# Patient Record
Sex: Male | Born: 1945 | Race: Black or African American | Hispanic: No | Marital: Married | Smoking: Never smoker
Health system: Southern US, Community
[De-identification: ages and names within clinical notes are randomized; demographics above are authoritative.]

## PROBLEM LIST (undated history)

## (undated) DIAGNOSIS — E538 Deficiency of other specified B group vitamins: Secondary | ICD-10-CM

## (undated) DIAGNOSIS — D649 Anemia, unspecified: Secondary | ICD-10-CM

## (undated) DIAGNOSIS — K219 Gastro-esophageal reflux disease without esophagitis: Secondary | ICD-10-CM

## (undated) DIAGNOSIS — H8110 Benign paroxysmal vertigo, unspecified ear: Secondary | ICD-10-CM

## (undated) DIAGNOSIS — M109 Gout, unspecified: Secondary | ICD-10-CM

## (undated) DIAGNOSIS — E785 Hyperlipidemia, unspecified: Secondary | ICD-10-CM

## (undated) DIAGNOSIS — H269 Unspecified cataract: Secondary | ICD-10-CM

## (undated) DIAGNOSIS — N259 Disorder resulting from impaired renal tubular function, unspecified: Secondary | ICD-10-CM

## (undated) DIAGNOSIS — M1990 Unspecified osteoarthritis, unspecified site: Secondary | ICD-10-CM

## (undated) DIAGNOSIS — R9431 Abnormal electrocardiogram [ECG] [EKG]: Secondary | ICD-10-CM

## (undated) DIAGNOSIS — I10 Essential (primary) hypertension: Secondary | ICD-10-CM

## (undated) DIAGNOSIS — G510 Bell's palsy: Secondary | ICD-10-CM

## (undated) HISTORY — DX: Gout, unspecified: M10.9

## (undated) HISTORY — DX: Gastro-esophageal reflux disease without esophagitis: K21.9

## (undated) HISTORY — PX: CARPAL TUNNEL RELEASE: SHX101

## (undated) HISTORY — DX: Essential (primary) hypertension: I10

## (undated) HISTORY — DX: Disorder resulting from impaired renal tubular function, unspecified: N25.9

## (undated) HISTORY — DX: Anemia, unspecified: D64.9

## (undated) HISTORY — DX: Unspecified cataract: H26.9

## (undated) HISTORY — DX: Benign paroxysmal vertigo, unspecified ear: H81.10

## (undated) HISTORY — PX: CATARACT EXTRACTION, BILATERAL: SHX1313

## (undated) HISTORY — DX: Hyperlipidemia, unspecified: E78.5

## (undated) HISTORY — DX: Deficiency of other specified B group vitamins: E53.8

## (undated) HISTORY — DX: Bell's palsy: G51.0

---

## 1998-05-08 ENCOUNTER — Emergency Department (HOSPITAL_COMMUNITY): Admission: EM | Admit: 1998-05-08 | Discharge: 1998-05-08 | Payer: Self-pay | Admitting: Emergency Medicine

## 1998-09-27 ENCOUNTER — Ambulatory Visit (HOSPITAL_COMMUNITY): Admission: RE | Admit: 1998-09-27 | Discharge: 1998-09-27 | Payer: Self-pay | Admitting: Gastroenterology

## 2000-10-20 ENCOUNTER — Ambulatory Visit (HOSPITAL_COMMUNITY): Admission: RE | Admit: 2000-10-20 | Discharge: 2000-10-20 | Payer: Self-pay | Admitting: Gastroenterology

## 2001-08-17 ENCOUNTER — Ambulatory Visit (HOSPITAL_COMMUNITY): Admission: RE | Admit: 2001-08-17 | Discharge: 2001-08-17 | Payer: Self-pay | Admitting: Gastroenterology

## 2001-08-17 ENCOUNTER — Encounter (INDEPENDENT_AMBULATORY_CARE_PROVIDER_SITE_OTHER): Payer: Self-pay | Admitting: Specialist

## 2004-09-23 ENCOUNTER — Ambulatory Visit: Payer: Self-pay | Admitting: Internal Medicine

## 2004-10-02 ENCOUNTER — Ambulatory Visit: Payer: Self-pay | Admitting: Internal Medicine

## 2004-12-03 ENCOUNTER — Ambulatory Visit: Payer: Self-pay | Admitting: Internal Medicine

## 2005-01-01 ENCOUNTER — Ambulatory Visit: Payer: Self-pay | Admitting: Internal Medicine

## 2005-02-02 ENCOUNTER — Ambulatory Visit: Payer: Self-pay | Admitting: Internal Medicine

## 2005-03-03 ENCOUNTER — Ambulatory Visit: Payer: Self-pay | Admitting: Internal Medicine

## 2005-04-13 ENCOUNTER — Ambulatory Visit: Payer: Self-pay | Admitting: Internal Medicine

## 2005-04-15 ENCOUNTER — Ambulatory Visit: Payer: Self-pay | Admitting: Internal Medicine

## 2005-04-20 ENCOUNTER — Ambulatory Visit: Payer: Self-pay | Admitting: Internal Medicine

## 2005-05-11 ENCOUNTER — Ambulatory Visit: Payer: Self-pay | Admitting: Internal Medicine

## 2005-06-11 ENCOUNTER — Ambulatory Visit: Payer: Self-pay | Admitting: Internal Medicine

## 2005-07-13 ENCOUNTER — Ambulatory Visit: Payer: Self-pay | Admitting: Internal Medicine

## 2005-08-10 ENCOUNTER — Ambulatory Visit: Payer: Self-pay | Admitting: Internal Medicine

## 2005-09-10 ENCOUNTER — Ambulatory Visit: Payer: Self-pay | Admitting: Internal Medicine

## 2005-09-23 ENCOUNTER — Ambulatory Visit: Payer: Self-pay | Admitting: Internal Medicine

## 2005-10-09 ENCOUNTER — Ambulatory Visit: Payer: Self-pay | Admitting: Internal Medicine

## 2005-10-13 ENCOUNTER — Ambulatory Visit: Payer: Self-pay | Admitting: Internal Medicine

## 2005-10-19 ENCOUNTER — Ambulatory Visit: Payer: Self-pay | Admitting: Internal Medicine

## 2005-11-09 ENCOUNTER — Ambulatory Visit: Payer: Self-pay | Admitting: Internal Medicine

## 2005-11-23 ENCOUNTER — Ambulatory Visit: Payer: Self-pay | Admitting: Internal Medicine

## 2005-12-10 ENCOUNTER — Ambulatory Visit: Payer: Self-pay | Admitting: Internal Medicine

## 2006-01-06 ENCOUNTER — Ambulatory Visit: Payer: Self-pay | Admitting: Internal Medicine

## 2006-02-08 ENCOUNTER — Ambulatory Visit: Payer: Self-pay | Admitting: Internal Medicine

## 2006-02-22 ENCOUNTER — Ambulatory Visit: Payer: Self-pay | Admitting: Internal Medicine

## 2006-02-23 ENCOUNTER — Encounter: Admission: RE | Admit: 2006-02-23 | Discharge: 2006-02-23 | Payer: Self-pay | Admitting: Internal Medicine

## 2006-03-04 ENCOUNTER — Ambulatory Visit: Payer: Self-pay

## 2006-03-09 ENCOUNTER — Ambulatory Visit: Payer: Self-pay | Admitting: Internal Medicine

## 2006-04-02 ENCOUNTER — Ambulatory Visit (HOSPITAL_COMMUNITY): Admission: RE | Admit: 2006-04-02 | Discharge: 2006-04-03 | Payer: Self-pay | Admitting: Neurosurgery

## 2006-05-27 ENCOUNTER — Encounter: Admission: RE | Admit: 2006-05-27 | Discharge: 2006-05-27 | Payer: Self-pay | Admitting: Neurosurgery

## 2006-06-07 ENCOUNTER — Ambulatory Visit: Payer: Self-pay | Admitting: Internal Medicine

## 2006-07-08 ENCOUNTER — Ambulatory Visit: Payer: Self-pay | Admitting: Internal Medicine

## 2006-08-05 ENCOUNTER — Ambulatory Visit: Payer: Self-pay | Admitting: Internal Medicine

## 2006-08-09 ENCOUNTER — Encounter: Payer: Self-pay | Admitting: Internal Medicine

## 2006-08-10 ENCOUNTER — Encounter: Admission: RE | Admit: 2006-08-10 | Discharge: 2006-11-08 | Payer: Self-pay | Admitting: Neurosurgery

## 2006-08-12 ENCOUNTER — Emergency Department (HOSPITAL_COMMUNITY): Admission: EM | Admit: 2006-08-12 | Discharge: 2006-08-12 | Payer: Self-pay | Admitting: Emergency Medicine

## 2006-08-31 ENCOUNTER — Encounter: Admission: RE | Admit: 2006-08-31 | Discharge: 2006-08-31 | Payer: Self-pay | Admitting: Neurosurgery

## 2006-09-06 ENCOUNTER — Ambulatory Visit: Payer: Self-pay | Admitting: Internal Medicine

## 2006-09-28 ENCOUNTER — Ambulatory Visit: Payer: Self-pay | Admitting: Internal Medicine

## 2006-09-28 LAB — CONVERTED CEMR LAB
ALT: 19 units/L (ref 0–40)
AST: 25 units/L (ref 0–37)
Albumin: 4.2 g/dL (ref 3.5–5.2)
Alkaline Phosphatase: 63 units/L (ref 39–117)
BUN: 14 mg/dL (ref 6–23)
Bilirubin, Direct: 0.1 mg/dL (ref 0.0–0.3)
CO2: 29 meq/L (ref 19–32)
Calcium: 9.6 mg/dL (ref 8.4–10.5)
Chloride: 102 meq/L (ref 96–112)
Chol/HDL Ratio, serum: 7.2
Cholesterol: 232 mg/dL (ref 0–200)
Creatinine, Ser: 1.6 mg/dL — ABNORMAL HIGH (ref 0.4–1.5)
GFR calc non Af Amer: 47 mL/min
Glomerular Filtration Rate, Af Am: 57 mL/min/{1.73_m2}
Glucose, Bld: 89 mg/dL (ref 70–99)
HDL: 32.1 mg/dL — ABNORMAL LOW (ref >39.0)
LDL DIRECT: 161.4 mg/dL
Potassium: 5.1 meq/L (ref 3.5–5.1)
Sodium: 138 meq/L (ref 135–145)
Total Bilirubin: 0.4 mg/dL (ref 0.3–1.2)
Total Protein: 6.8 g/dL (ref 6.0–8.3)
Triglyceride fasting, serum: 149 mg/dL (ref 0–149)
VLDL: 30 mg/dL (ref 0–40)

## 2006-10-07 ENCOUNTER — Ambulatory Visit: Payer: Self-pay | Admitting: Internal Medicine

## 2006-11-09 ENCOUNTER — Ambulatory Visit: Payer: Self-pay | Admitting: Internal Medicine

## 2006-11-17 ENCOUNTER — Ambulatory Visit: Payer: Self-pay | Admitting: Internal Medicine

## 2006-11-17 LAB — CONVERTED CEMR LAB
ALT: 18 units/L (ref 0–40)
AST: 28 units/L (ref 0–37)
Albumin: 4.1 g/dL (ref 3.5–5.2)
Alkaline Phosphatase: 80 units/L (ref 39–117)
BUN: 15 mg/dL (ref 6–23)
Bilirubin, Direct: 0.1 mg/dL (ref 0.0–0.3)
CO2: 30 meq/L (ref 19–32)
Calcium: 9.4 mg/dL (ref 8.4–10.5)
Chloride: 103 meq/L (ref 96–112)
Cholesterol: 222 mg/dL (ref 0–200)
Creatinine, Ser: 1.6 mg/dL — ABNORMAL HIGH (ref 0.4–1.5)
Direct LDL: 146.2 mg/dL
GFR calc Af Amer: 57 mL/min
GFR calc non Af Amer: 47 mL/min
Glucose, Bld: 88 mg/dL (ref 70–99)
HDL: 34 mg/dL — ABNORMAL LOW (ref >39.0)
Potassium: 4.7 meq/L (ref 3.5–5.1)
Sodium: 139 meq/L (ref 135–145)
Total Bilirubin: 0.5 mg/dL (ref 0.3–1.2)
Total CHOL/HDL Ratio: 6.5
Total Protein: 6.9 g/dL (ref 6.0–8.3)
Triglycerides: 184 mg/dL — ABNORMAL HIGH (ref 0–149)
VLDL: 37 mg/dL (ref 0–40)

## 2006-12-08 ENCOUNTER — Ambulatory Visit: Payer: Self-pay | Admitting: Internal Medicine

## 2006-12-29 ENCOUNTER — Ambulatory Visit: Payer: Self-pay | Admitting: Internal Medicine

## 2007-01-05 ENCOUNTER — Ambulatory Visit: Payer: Self-pay | Admitting: Internal Medicine

## 2007-02-03 ENCOUNTER — Ambulatory Visit: Payer: Self-pay | Admitting: Internal Medicine

## 2007-02-09 ENCOUNTER — Ambulatory Visit: Payer: Self-pay | Admitting: Internal Medicine

## 2007-03-04 ENCOUNTER — Encounter
Admission: RE | Admit: 2007-03-04 | Discharge: 2007-06-02 | Payer: Self-pay | Admitting: Physical Medicine & Rehabilitation

## 2007-03-07 ENCOUNTER — Ambulatory Visit: Payer: Self-pay | Admitting: Physical Medicine & Rehabilitation

## 2007-03-08 ENCOUNTER — Ambulatory Visit: Payer: Self-pay | Admitting: Internal Medicine

## 2007-03-10 ENCOUNTER — Encounter
Admission: RE | Admit: 2007-03-10 | Discharge: 2007-03-10 | Payer: Self-pay | Admitting: Physical Medicine & Rehabilitation

## 2007-04-07 ENCOUNTER — Ambulatory Visit: Payer: Self-pay | Admitting: Internal Medicine

## 2007-04-13 ENCOUNTER — Ambulatory Visit: Payer: Self-pay | Admitting: Internal Medicine

## 2007-04-13 LAB — CONVERTED CEMR LAB
BUN: 13 mg/dL (ref 6–23)
CO2: 30 meq/L (ref 19–32)
Calcium: 9.4 mg/dL (ref 8.4–10.5)
Chloride: 98 meq/L (ref 96–112)
Cholesterol: 255 mg/dL (ref 0–200)
Creatinine, Ser: 1.4 mg/dL (ref 0.4–1.5)
Direct LDL: 164.7 mg/dL
GFR calc Af Amer: 66 mL/min
GFR calc non Af Amer: 54 mL/min
Glucose, Bld: 89 mg/dL (ref 70–99)
HDL: 36.1 mg/dL — ABNORMAL LOW (ref >39.0)
Potassium: 4 meq/L (ref 3.5–5.1)
Sodium: 136 meq/L (ref 135–145)
Total CHOL/HDL Ratio: 7.1
Triglycerides: 169 mg/dL — ABNORMAL HIGH (ref 0–149)
VLDL: 34 mg/dL (ref 0–40)

## 2007-04-21 ENCOUNTER — Ambulatory Visit: Payer: Self-pay | Admitting: Physical Medicine & Rehabilitation

## 2007-05-05 ENCOUNTER — Ambulatory Visit: Payer: Self-pay | Admitting: Internal Medicine

## 2007-06-07 ENCOUNTER — Ambulatory Visit: Payer: Self-pay | Admitting: Internal Medicine

## 2007-06-15 ENCOUNTER — Encounter
Admission: RE | Admit: 2007-06-15 | Discharge: 2007-07-14 | Payer: Self-pay | Admitting: Physical Medicine & Rehabilitation

## 2007-06-16 ENCOUNTER — Ambulatory Visit: Payer: Self-pay | Admitting: Physical Medicine & Rehabilitation

## 2007-07-07 ENCOUNTER — Ambulatory Visit: Payer: Self-pay | Admitting: Internal Medicine

## 2007-07-07 DIAGNOSIS — E538 Deficiency of other specified B group vitamins: Secondary | ICD-10-CM | POA: Insufficient documentation

## 2007-07-07 HISTORY — DX: Deficiency of other specified B group vitamins: E53.8

## 2007-07-13 ENCOUNTER — Ambulatory Visit: Payer: Self-pay | Admitting: Internal Medicine

## 2007-07-13 DIAGNOSIS — I10 Essential (primary) hypertension: Secondary | ICD-10-CM

## 2007-07-13 DIAGNOSIS — E785 Hyperlipidemia, unspecified: Secondary | ICD-10-CM

## 2007-07-13 DIAGNOSIS — K219 Gastro-esophageal reflux disease without esophagitis: Secondary | ICD-10-CM

## 2007-07-13 HISTORY — DX: Gastro-esophageal reflux disease without esophagitis: K21.9

## 2007-07-13 HISTORY — DX: Essential (primary) hypertension: I10

## 2007-07-13 HISTORY — DX: Hyperlipidemia, unspecified: E78.5

## 2007-07-13 HISTORY — PX: BACK SURGERY: SHX140

## 2007-08-04 ENCOUNTER — Ambulatory Visit: Payer: Self-pay | Admitting: Internal Medicine

## 2007-09-06 ENCOUNTER — Ambulatory Visit: Payer: Self-pay | Admitting: Internal Medicine

## 2007-09-07 ENCOUNTER — Ambulatory Visit: Payer: Self-pay | Admitting: Physical Medicine & Rehabilitation

## 2007-09-08 ENCOUNTER — Encounter
Admission: RE | Admit: 2007-09-08 | Discharge: 2007-09-08 | Payer: Self-pay | Admitting: Physical Medicine & Rehabilitation

## 2007-10-06 ENCOUNTER — Ambulatory Visit: Payer: Self-pay | Admitting: Internal Medicine

## 2007-11-08 ENCOUNTER — Ambulatory Visit: Payer: Self-pay | Admitting: Internal Medicine

## 2007-11-08 LAB — CONVERTED CEMR LAB
Albumin: 4.3 g/dL (ref 3.5–5.2)
Alkaline Phosphatase: 58 units/L (ref 39–117)
BUN: 13 mg/dL (ref 6–23)
CO2: 31 meq/L (ref 19–32)
GFR calc Af Amer: 61 mL/min
HDL: 32.5 mg/dL — ABNORMAL LOW (ref >39.0)
Potassium: 4.4 meq/L (ref 3.5–5.1)
Total Protein: 7.2 g/dL (ref 6.0–8.3)
Triglycerides: 183 mg/dL — ABNORMAL HIGH (ref 0–149)
VLDL: 37 mg/dL (ref 0–40)

## 2007-11-15 ENCOUNTER — Ambulatory Visit: Payer: Self-pay | Admitting: Internal Medicine

## 2007-12-07 ENCOUNTER — Ambulatory Visit: Payer: Self-pay | Admitting: Internal Medicine

## 2007-12-08 ENCOUNTER — Ambulatory Visit: Payer: Self-pay | Admitting: Physical Medicine & Rehabilitation

## 2007-12-08 ENCOUNTER — Encounter
Admission: RE | Admit: 2007-12-08 | Discharge: 2008-02-02 | Payer: Self-pay | Admitting: Physical Medicine & Rehabilitation

## 2007-12-22 ENCOUNTER — Ambulatory Visit: Payer: Self-pay | Admitting: Internal Medicine

## 2007-12-22 LAB — CONVERTED CEMR LAB
AST: 25 units/L (ref 0–37)
Bilirubin, Direct: 0.1 mg/dL (ref 0.0–0.3)
Cholesterol: 199 mg/dL (ref 0–200)
Direct LDL: 120.5 mg/dL
HDL: 30 mg/dL — ABNORMAL LOW (ref >39.0)
Total Protein: 7.4 g/dL (ref 6.0–8.3)
VLDL: 42 mg/dL — ABNORMAL HIGH (ref 0–40)

## 2007-12-28 ENCOUNTER — Ambulatory Visit: Payer: Self-pay | Admitting: Internal Medicine

## 2008-01-04 ENCOUNTER — Ambulatory Visit: Payer: Self-pay | Admitting: Internal Medicine

## 2008-02-02 ENCOUNTER — Ambulatory Visit: Payer: Self-pay | Admitting: Physical Medicine & Rehabilitation

## 2008-02-08 ENCOUNTER — Ambulatory Visit: Payer: Self-pay | Admitting: Internal Medicine

## 2008-02-24 ENCOUNTER — Encounter: Payer: Self-pay | Admitting: Internal Medicine

## 2008-03-01 ENCOUNTER — Ambulatory Visit: Payer: Self-pay | Admitting: Internal Medicine

## 2008-03-01 LAB — CONVERTED CEMR LAB
ALT: 17 units/L (ref 0–53)
AST: 27 units/L (ref 0–37)
Alkaline Phosphatase: 66 units/L (ref 39–117)
Bilirubin, Direct: 0.1 mg/dL (ref 0.0–0.3)
HDL: 28.2 mg/dL — ABNORMAL LOW (ref >39.0)
PSA: 2.57 ng/mL (ref 0.10–4.00)
Total Protein: 7.2 g/dL (ref 6.0–8.3)

## 2008-03-08 ENCOUNTER — Ambulatory Visit: Payer: Self-pay | Admitting: Internal Medicine

## 2008-04-05 ENCOUNTER — Ambulatory Visit: Payer: Self-pay | Admitting: Internal Medicine

## 2008-04-12 ENCOUNTER — Ambulatory Visit: Payer: Self-pay | Admitting: Internal Medicine

## 2008-04-12 LAB — CONVERTED CEMR LAB
ALT: 20 units/L (ref 0–53)
AST: 30 units/L (ref 0–37)
Albumin: 3.8 g/dL (ref 3.5–5.2)
Direct LDL: 137.5 mg/dL
HDL: 27.2 mg/dL — ABNORMAL LOW (ref >39.0)
Total Protein: 7.2 g/dL (ref 6.0–8.3)

## 2008-04-24 ENCOUNTER — Encounter
Admission: RE | Admit: 2008-04-24 | Discharge: 2008-04-26 | Payer: Self-pay | Admitting: Physical Medicine & Rehabilitation

## 2008-04-24 ENCOUNTER — Ambulatory Visit: Payer: Self-pay | Admitting: Internal Medicine

## 2008-04-26 ENCOUNTER — Ambulatory Visit: Payer: Self-pay | Admitting: Physical Medicine & Rehabilitation

## 2008-05-08 ENCOUNTER — Ambulatory Visit: Payer: Self-pay | Admitting: Internal Medicine

## 2008-06-05 ENCOUNTER — Ambulatory Visit: Payer: Self-pay | Admitting: Internal Medicine

## 2008-07-05 ENCOUNTER — Ambulatory Visit: Payer: Self-pay | Admitting: Internal Medicine

## 2008-07-19 ENCOUNTER — Ambulatory Visit: Payer: Self-pay | Admitting: Internal Medicine

## 2008-07-19 LAB — CONVERTED CEMR LAB
ALT: 21 units/L (ref 0–53)
AST: 26 units/L (ref 0–37)
Alkaline Phosphatase: 68 units/L (ref 39–117)
Bilirubin, Direct: 0.1 mg/dL (ref 0.0–0.3)
Total Bilirubin: 0.6 mg/dL (ref 0.3–1.2)

## 2008-07-26 ENCOUNTER — Ambulatory Visit: Payer: Self-pay | Admitting: Internal Medicine

## 2008-08-02 ENCOUNTER — Ambulatory Visit: Payer: Self-pay | Admitting: Internal Medicine

## 2008-08-22 ENCOUNTER — Encounter
Admission: RE | Admit: 2008-08-22 | Discharge: 2008-08-23 | Payer: Self-pay | Admitting: Physical Medicine & Rehabilitation

## 2008-08-23 ENCOUNTER — Ambulatory Visit: Payer: Self-pay | Admitting: Physical Medicine & Rehabilitation

## 2008-09-04 ENCOUNTER — Ambulatory Visit: Payer: Self-pay | Admitting: Internal Medicine

## 2008-10-04 ENCOUNTER — Ambulatory Visit: Payer: Self-pay | Admitting: Internal Medicine

## 2008-11-05 ENCOUNTER — Ambulatory Visit: Payer: Self-pay | Admitting: Internal Medicine

## 2008-11-27 ENCOUNTER — Encounter
Admission: RE | Admit: 2008-11-27 | Discharge: 2009-02-25 | Payer: Self-pay | Admitting: Physical Medicine & Rehabilitation

## 2008-11-27 ENCOUNTER — Ambulatory Visit: Payer: Self-pay | Admitting: Physical Medicine & Rehabilitation

## 2008-12-05 ENCOUNTER — Ambulatory Visit: Payer: Self-pay | Admitting: Internal Medicine

## 2008-12-19 ENCOUNTER — Telehealth: Payer: Self-pay | Admitting: Internal Medicine

## 2009-01-07 ENCOUNTER — Ambulatory Visit: Payer: Self-pay | Admitting: Internal Medicine

## 2009-01-11 ENCOUNTER — Ambulatory Visit: Payer: Self-pay | Admitting: Internal Medicine

## 2009-01-11 LAB — CONVERTED CEMR LAB
Albumin: 4.1 g/dL (ref 3.5–5.2)
Alkaline Phosphatase: 76 units/L (ref 39–117)
BUN: 19 mg/dL (ref 6–23)
Cholesterol: 191 mg/dL (ref 0–200)
GFR calc Af Amer: 56 mL/min
GFR calc non Af Amer: 46 mL/min
LDL Cholesterol: 127 mg/dL — ABNORMAL HIGH (ref 0–99)
Potassium: 4.1 meq/L (ref 3.5–5.1)
Sodium: 140 meq/L (ref 135–145)
Total CHOL/HDL Ratio: 5.4
VLDL: 29 mg/dL (ref 0–40)

## 2009-01-18 ENCOUNTER — Ambulatory Visit: Payer: Self-pay | Admitting: Internal Medicine

## 2009-02-07 ENCOUNTER — Ambulatory Visit: Payer: Self-pay | Admitting: Internal Medicine

## 2009-03-11 ENCOUNTER — Ambulatory Visit: Payer: Self-pay | Admitting: Internal Medicine

## 2009-03-13 ENCOUNTER — Encounter
Admission: RE | Admit: 2009-03-13 | Discharge: 2009-03-18 | Payer: Self-pay | Admitting: Physical Medicine & Rehabilitation

## 2009-03-18 ENCOUNTER — Ambulatory Visit: Payer: Self-pay | Admitting: Physical Medicine & Rehabilitation

## 2009-04-08 ENCOUNTER — Ambulatory Visit: Payer: Self-pay | Admitting: Internal Medicine

## 2009-05-08 ENCOUNTER — Ambulatory Visit: Payer: Self-pay | Admitting: Internal Medicine

## 2009-06-10 ENCOUNTER — Ambulatory Visit: Payer: Self-pay | Admitting: Internal Medicine

## 2009-06-14 ENCOUNTER — Telehealth: Payer: Self-pay | Admitting: Internal Medicine

## 2009-06-18 ENCOUNTER — Encounter: Payer: Self-pay | Admitting: Internal Medicine

## 2009-06-19 ENCOUNTER — Telehealth: Payer: Self-pay | Admitting: Internal Medicine

## 2009-07-10 ENCOUNTER — Ambulatory Visit: Payer: Self-pay | Admitting: Internal Medicine

## 2009-07-15 ENCOUNTER — Ambulatory Visit: Payer: Self-pay | Admitting: Internal Medicine

## 2009-07-15 LAB — CONVERTED CEMR LAB
Albumin: 4.1 g/dL (ref 3.5–5.2)
Alkaline Phosphatase: 61 units/L (ref 39–117)
BUN: 18 mg/dL (ref 6–23)
Basophils Absolute: 0 10*3/uL (ref 0.0–0.1)
Bilirubin, Direct: 0 mg/dL (ref 0.0–0.3)
Blood in Urine, dipstick: NEGATIVE
CO2: 32 meq/L (ref 19–32)
Calcium: 9.3 mg/dL (ref 8.4–10.5)
Creatinine, Ser: 1.7 mg/dL — ABNORMAL HIGH (ref 0.4–1.5)
Eosinophils Absolute: 0.1 10*3/uL (ref 0.0–0.7)
Glucose, Bld: 99 mg/dL (ref 70–99)
Glucose, Urine, Semiquant: NEGATIVE
HDL: 34.5 mg/dL — ABNORMAL LOW (ref >39.00)
Ketones, urine, test strip: NEGATIVE
Lymphocytes Relative: 28.4 % (ref 12.0–46.0)
MCHC: 32.9 g/dL (ref 30.0–36.0)
Neutrophils Relative %: 59 % (ref 43.0–77.0)
Nitrite: NEGATIVE
PSA: 2.33 ng/mL (ref 0.10–4.00)
RDW: 13.6 % (ref 11.5–14.6)
Specific Gravity, Urine: 1.015
Triglycerides: 153 mg/dL — ABNORMAL HIGH (ref 0.0–149.0)
pH: 7

## 2009-08-02 ENCOUNTER — Ambulatory Visit: Payer: Self-pay | Admitting: Internal Medicine

## 2009-08-02 DIAGNOSIS — D649 Anemia, unspecified: Secondary | ICD-10-CM

## 2009-08-02 HISTORY — DX: Anemia, unspecified: D64.9

## 2009-08-08 LAB — CONVERTED CEMR LAB
Basophils Relative: 0.4 % (ref 0.0–3.0)
Eosinophils Relative: 1.7 % (ref 0.0–5.0)
Ferritin: 123.2 ng/mL (ref 22.0–322.0)
Folate: 7.7 ng/mL
HCT: 37.7 % — ABNORMAL LOW (ref 39.0–52.0)
MCV: 83.5 fL (ref 78.0–100.0)
Monocytes Absolute: 0.3 10*3/uL (ref 0.1–1.0)
Monocytes Relative: 8.7 % (ref 3.0–12.0)
Neutrophils Relative %: 61.2 % (ref 43.0–77.0)
RBC: 4.51 M/uL (ref 4.22–5.81)
WBC: 3.3 10*3/uL — ABNORMAL LOW (ref 4.5–10.5)

## 2009-08-09 ENCOUNTER — Ambulatory Visit: Payer: Self-pay | Admitting: Internal Medicine

## 2009-09-10 ENCOUNTER — Ambulatory Visit: Payer: Self-pay | Admitting: Internal Medicine

## 2009-10-08 ENCOUNTER — Ambulatory Visit: Payer: Self-pay | Admitting: Internal Medicine

## 2009-11-07 ENCOUNTER — Ambulatory Visit: Payer: Self-pay | Admitting: Internal Medicine

## 2009-11-07 ENCOUNTER — Telehealth: Payer: Self-pay | Admitting: Internal Medicine

## 2009-11-07 LAB — CONVERTED CEMR LAB
Basophils Relative: 0.5 % (ref 0.0–3.0)
Eosinophils Relative: 2.9 % (ref 0.0–5.0)
Hemoglobin: 12.5 g/dL — ABNORMAL LOW (ref 13.0–17.0)
Lymphocytes Relative: 29.7 % (ref 12.0–46.0)
MCHC: 32.3 g/dL (ref 30.0–36.0)
Monocytes Relative: 9.5 % (ref 3.0–12.0)
Neutro Abs: 2.1 10*3/uL (ref 1.4–7.7)
Neutrophils Relative %: 57.4 % (ref 43.0–77.0)
RBC: 4.56 M/uL (ref 4.22–5.81)
WBC: 3.7 10*3/uL — ABNORMAL LOW (ref 4.5–10.5)

## 2009-11-25 ENCOUNTER — Ambulatory Visit: Payer: Self-pay | Admitting: Internal Medicine

## 2009-11-25 LAB — CONVERTED CEMR LAB
BUN: 22 mg/dL (ref 6–23)
Bilirubin, Direct: 0.1 mg/dL (ref 0.0–0.3)
Chloride: 101 meq/L (ref 96–112)
Glucose, Bld: 99 mg/dL (ref 70–99)
HDL: 36.9 mg/dL — ABNORMAL LOW (ref >39.00)
Potassium: 4.2 meq/L (ref 3.5–5.1)
Sodium: 139 meq/L (ref 135–145)
Total Bilirubin: 0.7 mg/dL (ref 0.3–1.2)
Total CHOL/HDL Ratio: 7
Triglycerides: 161 mg/dL — ABNORMAL HIGH (ref 0.0–149.0)
VLDL: 32.2 mg/dL (ref 0.0–40.0)

## 2009-12-09 ENCOUNTER — Ambulatory Visit: Payer: Self-pay | Admitting: Internal Medicine

## 2010-01-06 ENCOUNTER — Ambulatory Visit: Payer: Self-pay | Admitting: Internal Medicine

## 2010-02-04 ENCOUNTER — Ambulatory Visit: Payer: Self-pay | Admitting: Internal Medicine

## 2010-02-05 LAB — CONVERTED CEMR LAB
Basophils Absolute: 0 10*3/uL (ref 0.0–0.1)
CO2: 32 meq/L (ref 19–32)
Calcium: 9.5 mg/dL (ref 8.4–10.5)
Creatinine, Ser: 1.6 mg/dL — ABNORMAL HIGH (ref 0.4–1.5)
Eosinophils Absolute: 0.1 10*3/uL (ref 0.0–0.7)
GFR calc non Af Amer: 55.88 mL/min (ref >60)
Glucose, Bld: 91 mg/dL (ref 70–99)
HDL: 40.3 mg/dL (ref >39.00)
Hemoglobin: 12.6 g/dL — ABNORMAL LOW (ref 13.0–17.0)
Lymphocytes Relative: 29.8 % (ref 12.0–46.0)
MCHC: 34.2 g/dL (ref 30.0–36.0)
Monocytes Relative: 8.4 % (ref 3.0–12.0)
Neutro Abs: 1.9 10*3/uL (ref 1.4–7.7)
Neutrophils Relative %: 59.1 % (ref 43.0–77.0)
RBC: 4.49 M/uL (ref 4.22–5.81)
RDW: 15.2 % — ABNORMAL HIGH (ref 11.5–14.6)
Sodium: 139 meq/L (ref 135–145)
TSH: 1.56 microintl units/mL (ref 0.35–5.50)
Triglycerides: 133 mg/dL (ref 0.0–149.0)
VLDL: 26.6 mg/dL (ref 0.0–40.0)

## 2010-03-05 ENCOUNTER — Ambulatory Visit: Payer: Self-pay | Admitting: Internal Medicine

## 2010-04-07 ENCOUNTER — Ambulatory Visit: Payer: Self-pay | Admitting: Internal Medicine

## 2010-04-25 ENCOUNTER — Ambulatory Visit: Payer: Self-pay | Admitting: Family Medicine

## 2010-04-25 ENCOUNTER — Telehealth: Payer: Self-pay | Admitting: Internal Medicine

## 2010-05-07 ENCOUNTER — Ambulatory Visit: Payer: Self-pay | Admitting: Internal Medicine

## 2010-06-04 ENCOUNTER — Ambulatory Visit: Payer: Self-pay | Admitting: Internal Medicine

## 2010-06-04 DIAGNOSIS — N182 Chronic kidney disease, stage 2 (mild): Secondary | ICD-10-CM

## 2010-06-04 DIAGNOSIS — N259 Disorder resulting from impaired renal tubular function, unspecified: Secondary | ICD-10-CM

## 2010-06-04 HISTORY — DX: Disorder resulting from impaired renal tubular function, unspecified: N25.9

## 2010-06-17 ENCOUNTER — Ambulatory Visit: Payer: Self-pay | Admitting: Internal Medicine

## 2010-06-17 DIAGNOSIS — M109 Gout, unspecified: Secondary | ICD-10-CM

## 2010-06-17 HISTORY — DX: Gout, unspecified: M10.9

## 2010-07-08 ENCOUNTER — Ambulatory Visit: Payer: Self-pay | Admitting: Internal Medicine

## 2010-08-07 ENCOUNTER — Ambulatory Visit: Payer: Self-pay | Admitting: Internal Medicine

## 2010-08-11 ENCOUNTER — Ambulatory Visit: Payer: Self-pay | Admitting: Internal Medicine

## 2010-08-14 ENCOUNTER — Telehealth: Payer: Self-pay | Admitting: Internal Medicine

## 2010-08-19 LAB — CONVERTED CEMR LAB
ALT: 19 units/L (ref 0–53)
AST: 26 units/L (ref 0–37)
Albumin: 4.1 g/dL (ref 3.5–5.2)
Alkaline Phosphatase: 55 units/L (ref 39–117)
Basophils Absolute: 0 10*3/uL (ref 0.0–0.1)
Basophils Relative: 0.4 % (ref 0.0–3.0)
Calcium: 9.2 mg/dL (ref 8.4–10.5)
Direct LDL: 171.8 mg/dL
Eosinophils Relative: 1.2 % (ref 0.0–5.0)
GFR calc non Af Amer: 60.57 mL/min (ref >60)
HCT: 38.2 % — ABNORMAL LOW (ref 39.0–52.0)
Hemoglobin: 12.9 g/dL — ABNORMAL LOW (ref 13.0–17.0)
Lymphocytes Relative: 28.5 % (ref 12.0–46.0)
Lymphs Abs: 1.1 10*3/uL (ref 0.7–4.0)
Monocytes Relative: 8.4 % (ref 3.0–12.0)
Neutro Abs: 2.5 10*3/uL (ref 1.4–7.7)
PSA: 5.82 ng/mL — ABNORMAL HIGH (ref 0.10–4.00)
Potassium: 4 meq/L (ref 3.5–5.1)
RBC: 4.5 M/uL (ref 4.22–5.81)
RDW: 16.4 % — ABNORMAL HIGH (ref 11.5–14.6)
Sodium: 139 meq/L (ref 135–145)
Uric Acid, Serum: 5.3 mg/dL (ref 4.0–7.8)
VLDL: 23.4 mg/dL (ref 0.0–40.0)
WBC: 4 10*3/uL — ABNORMAL LOW (ref 4.5–10.5)

## 2010-08-27 ENCOUNTER — Ambulatory Visit: Payer: Self-pay | Admitting: Internal Medicine

## 2010-08-27 LAB — CONVERTED CEMR LAB
BUN: 22 mg/dL (ref 6–23)
Chloride: 100 meq/L (ref 96–112)
Cholesterol: 227 mg/dL — ABNORMAL HIGH (ref 0–200)
Creatinine, Ser: 1.6 mg/dL — ABNORMAL HIGH (ref 0.4–1.5)
GFR calc non Af Amer: 57.43 mL/min (ref >60)
Glucose, Bld: 90 mg/dL (ref 70–99)
HDL: 37.1 mg/dL — ABNORMAL LOW (ref >39.00)
Potassium: 3.9 meq/L (ref 3.5–5.1)

## 2010-09-01 ENCOUNTER — Ambulatory Visit: Payer: Self-pay | Admitting: Internal Medicine

## 2010-09-04 ENCOUNTER — Ambulatory Visit: Payer: Self-pay | Admitting: Internal Medicine

## 2010-10-07 ENCOUNTER — Ambulatory Visit: Payer: Self-pay | Admitting: Internal Medicine

## 2010-11-06 ENCOUNTER — Ambulatory Visit
Admission: RE | Admit: 2010-11-06 | Discharge: 2010-11-06 | Payer: Self-pay | Source: Home / Self Care | Attending: Family Medicine | Admitting: Family Medicine

## 2010-11-10 ENCOUNTER — Encounter: Payer: Self-pay | Admitting: Internal Medicine

## 2010-11-23 ENCOUNTER — Encounter: Payer: Self-pay | Admitting: Internal Medicine

## 2010-11-29 ENCOUNTER — Encounter: Payer: Self-pay | Admitting: Internal Medicine

## 2010-12-02 NOTE — Progress Notes (Signed)
Summary: elbow swelling  Phone Note Call from Patient Call back at Home Phone [phone]   Caller: Patient---voice mail Reason for Call: Talk to Nurse Summary of Call: Please return call, asap. Initial call taken by: Warnell Forester,  April 25, 2010 9:17 AM  Follow-up for Phone Call        c/o elbow swelling so cannot move arm since last night and getting worse.  Appt made with dr fry for this afternoon. Follow-up by: Gladis Riffle, RN,  April 25, 2010 11:05 AM

## 2010-12-02 NOTE — Assessment & Plan Note (Signed)
Summary: B12 INJ // RS  Nurse Visit   Allergies: No Known Drug Allergies  Medication Administration  Injection # 1:    Medication: Vit B12 1000 mcg    Diagnosis: B12 DEFICIENCY (ICD-266.2)    Route: IM    Site: L deltoid    Exp Date: 07/04/2011    Lot #: [id number]    Mfr: American Regent    Patient tolerated injection without complications    Given by: Gladis Riffle, RN (May 07, 2010 1:36 PM)  Orders Added: 1)  Vit B12 1000 mcg [J3420] 2)  Admin of Therapeutic Inj  intramuscular or subcutaneous [96372]   Medication Administration  Injection # 1:    Medication: Vit B12 1000 mcg    Diagnosis: B12 DEFICIENCY (ICD-266.2)    Route: IM    Site: L deltoid    Exp Date: 07/04/2011    Lot #: [id number]    Mfr: American Regent    Patient tolerated injection without complications    Given by: Gladis Riffle, RN (May 07, 2010 1:36 PM)  Orders Added: 1)  Vit B12 1000 mcg [J3420] 2)  Admin of Therapeutic Inj  intramuscular or subcutaneous [[id number]]

## 2010-12-02 NOTE — Assessment & Plan Note (Signed)
Summary: discuss PSA and chol/dm   History of Present Illness: patient comes in for followup after lab work. He had an elevated PSA. To 10 days of Cipro. He is here for follow. Also has struggled with hyperlipidemia. She's here for followup. He has refused statins in the past. Patient admits to being noncompliant with his exercise habits. He states that he is "very lazy".  Review of systems no chest pain or shortness of breath.  Allergies: 1)  ! Nsaids  Past History:  Past Medical History: Last updated: 04/25/2010 B12 deficiency osteoarthritis GERD Hyperlipidemia Hypertension gout  Past Surgical History: Last updated: 07/13/2007 back surgery  Family History: Last updated: 2007/11/27 father deceased young mother deceased young-unknown cause for both  Social History: Last updated: 11-27-2007 Married Never Smoked Alcohol use-no Regular exercise-no  Risk Factors: Exercise: no (08/02/2009)  Risk Factors: Smoking Status: never (06/17/2010)  Physical Exam  General:  alert and well-developed.   Heart:  normal rate and regular rhythm.     Impression & Recommendations:  Problem # 1:  HYPERLIPIDEMIA (ICD-272.4) refuses Rx discussed risk of refusing meds---including catastrophic vascular event  Problem # 2:  PSA, INCREASED (ICD-790.93) resolved f/u next year  Complete Medication List: 1)  Aciphex 20 Mg Tbec (Rabeprazole sodium) .... One as needed once daily 2)  Triamterene-hctz 37.5-25 Mg Tabs (Triamterene-hctz) .... One by mouth daily 3)  Cyanocobalamin 1000 Mcg/ml Inj Soln (Cyanocobalamin) .Marland Kitchen.. every month 4)  Tramadol Hcl 50 Mg Tabs (Tramadol hcl) .... Take 2  two times a day 5)  Polyethylene Glycol 3350 Powd (Polyethylene glycol 3350) .... Every hs 6)  Losartan Potassium 50 Mg Tabs (Losartan potassium) .Marland Kitchen.. 1 by mouth once daily. 7)  Allopurinol 300 Mg Tabs (Allopurinol) .... Take 1 tablet by mouth once a day 8)  Colchicine 0.6 Mg Tabs (Colchicine)  .... Two times a day tab as needed gout  Contraindications/Deferment of Procedures/Staging:    Test/Procedure: Statin Declined    Reason for deferment: patient declined   Patient Instructions: 1)  Please schedule a follow-up appointment in 4 months.   Orders Added: 1)  Est. Patient Level III [27062]

## 2010-12-02 NOTE — Progress Notes (Signed)
Summary: Scott [name]  Scott [name]   Imported By: Scott [name] 04/24/2010 10:06:07  _____________________________________________________________________  External Attachment:    Type:   Image     Comment:   External Document

## 2010-12-02 NOTE — Assessment & Plan Note (Signed)
Summary: b12 inj//ccm  Nurse Visit   Allergies: 1)  ! Nsaids  Medication Administration  Injection # 1:    Medication: Vit B12 1000 mcg    Diagnosis: B12 DEFICIENCY (ICD-266.2)    Route: IM    Site: L deltoid    Exp Date: 7/13    Lot #: [id number]    Mfr: American Regent    Patient tolerated injection without complications    Given by: Alfred Levins, CMA (September 04, 2010 11:45 AM)  Orders Added: 1)  Vit B12 1000 mcg [J3420] 2)  Admin of Therapeutic Inj  intramuscular or subcutaneous [[id number]]

## 2010-12-02 NOTE — Assessment & Plan Note (Signed)
Summary: b12 inj//ccm  Nurse Visit   Allergies (verified): 1)  ! Nsaids  Medication Administration  Injection # 1:    Medication: Vit B12 1000 mcg    Diagnosis: B12 DEFICIENCY (ICD-266.2)    Route: IM    Site: L deltoid    Exp Date: 08/02/2010    Lot #: [id number]    Mfr: American Regent    Patient tolerated injection without complications    Given by: Gladis Riffle, RN (June 04, 2010 1:57 PM)  Orders Added: 1)  Vit B12 1000 mcg [J3420] 2)  Admin of Therapeutic Inj  intramuscular or subcutaneous [[id number]]   Medication Administration  Injection # 1:    Medication: Vit B12 1000 mcg    Diagnosis: B12 DEFICIENCY (ICD-266.2)    Route: IM    Site: L deltoid    Exp Date: 08/02/2010    Lot #: [id number]    Mfr: American Regent    Patient tolerated injection without complications    Given by: Gladis Riffle, RN (June 04, 2010 1:57 PM)  Orders Added: 1)  Vit B12 1000 mcg [J3420] 2)  Admin of Therapeutic Inj  intramuscular or subcutaneous [16010]

## 2010-12-02 NOTE — Progress Notes (Signed)
Summary: something for gout  Phone Note Call from Patient Call back at Home Phone [phone]   Caller: Patient Call For: Birdie Sons MD Summary of Call: Pt was seen monday for swollen elbow has gout, pt would like [facility] [phone] Initial call taken by: Heron Sabins,  August 14, 2010 10:52 AM  Follow-up for Phone Call        see rx Follow-up by: Birdie Sons MD,  August 14, 2010 4:15 PM  Additional Follow-up for Phone Call Additional follow up Details #1::        Phone Call Completed Additional Follow-up by: Rudy Jew, RN,  August 15, 2010 9:26 AM    New/Updated Medications: COLCHICINE 0.6 MG TABS (COLCHICINE) two times a day tab as needed gout Prescriptions: COLCHICINE 0.6 MG TABS (COLCHICINE) two times a day tab as needed gout  #20 x 1   Entered and Authorized by:   Birdie Sons MD   Signed by:   Birdie Sons MD on 08/14/2010   Method used:   [facility]        [facility] [address] [phone]* (retail)       [address]       [place], [place]  [address]       Ph: [phone] or [phone]       Fax: [phone]   RxID:   [id number]

## 2010-12-02 NOTE — Assessment & Plan Note (Signed)
Summary: med check/cjr   Vital Signs:  Patient profile:   65 year old male Weight:      98.6 pounds Temp:     98.6 degrees F oral BP sitting:   110 / 74  (left arm) Cuff size:   regular  Vitals Entered By: Sid Falcon LPN (August 11, 2010 9:54 AM)  Contraindications/Deferment of Procedures/Staging:    Test/Procedure: FLU VAX    Reason for deferment: patient declined     Test/Procedure: Pneumovax vaccine    Reason for deferment: patient declined   History of Present Illness:  Follow-Up Visit      This is a 65 year old man who presents for Follow-up visit.  The patient denies chest pain and palpitations.  Since the last visit the patient notes no new problems or concerns---he complains of multiple aches and pains---these are chronic and unchanged. Pt notwes that when he was treated with prednsione for acute gout lare his chronic aches and pains felt better.  The patient reports taking meds as prescribed.  When questioned about possible medication side effects, the patient notes none.    All other systems reviewed and were negative   Current Problems (verified): 1)  Gout, Unspecified  (ICD-274.9) 2)  Renal Insufficiency  (ICD-588.9) 3)  Bursitis  (ICD-727.3) 4)  Anemia, Other Unspec  (ICD-285.9) 5)  Preventive Health Care  (ICD-V70.0) 6)  Hypertension  (ICD-401.9) 7)  Hyperlipidemia  (ICD-272.4) 8)  Gerd  (ICD-530.81) 9)  B12 Deficiency  (ICD-266.2)  Current Medications (verified): 1)  Aciphex 20 Mg Tbec (Rabeprazole Sodium) .... One As Needed Once Daily 2)  Triamterene-Hctz 37.5-25 Mg Tabs (Triamterene-Hctz) .... One By Mouth Daily 3)  Cyanocobalamin 1000 Mcg/ml Inj Soln (Cyanocobalamin) .Marland Kitchen.. Every Month 4)  Tramadol Hcl 50 Mg Tabs (Tramadol Hcl) .... Take 2  Two Times A Day 5)  Polyethylene Glycol 3350  Powd (Polyethylene Glycol 3350) .... Every Hs 6)  Losartan Potassium 50 Mg Tabs (Losartan Potassium) .Marland Kitchen.. 1 By Mouth Once Daily. 7)  Allopurinol 300 Mg Tabs  (Allopurinol) .... Take 1 Tablet By Mouth Once A Day  Allergies: 1)  ! Nsaids  Past History:  Past Medical History: Last updated: 04/25/2010 B12 deficiency osteoarthritis GERD Hyperlipidemia Hypertension gout  Past Surgical History: Last updated: 07/13/2007 back surgery  Family History: Last updated: 12-14-07 father deceased young mother deceased young-unknown cause for both  Social History: Last updated: 12-14-07 Married Never Smoked Alcohol use-no Regular exercise-no  Risk Factors: Exercise: no (08/02/2009)  Risk Factors: Smoking Status: never (06/17/2010)  Physical Exam  General:  alert and well-developed.   Head:  normocephalic and atraumatic.   Eyes:  pupils equal and pupils round.   Ears:  R ear normal and L ear normal.   Nose:  no external deformity and no external erythema.   Neck:  supple, full ROM, and no masses.   Chest Wall:  No deformities, masses, tenderness or gynecomastia noted. Lungs:  normal respiratory effort and no intercostal retractions.   Heart:  normal rate and regular rhythm.   Abdomen:  soft, non-tender, normal bowel sounds, no distention, no masses, no guarding, and no rigidity,  Skin:  turgor normal and color normal.   Psych:  good eye contact and not anxious appearing.     Impression & Recommendations:  Problem # 1:  HYPERTENSION (ICD-401.9) reviewed Hx and labs His updated medication list for this problem includes:    Triamterene-hctz 37.5-25 Mg Tabs (Triamterene-hctz) ..... One by mouth daily  Losartan Potassium 50 Mg Tabs (Losartan potassium) .Marland Kitchen... 1 by mouth once daily.  BP today: 110/74 Prior BP: 110/80 (06/17/2010)  Labs Reviewed: K+: 4.3 (02/04/2010) Creat: : 1.6 (02/04/2010)   Chol: 227 (02/04/2010)   HDL: 40.30 (02/04/2010)   LDL: 119 (07/15/2009)   TG: 133.0 (02/04/2010)  Problem # 2:  RENAL INSUFFICIENCY (ICD-588.9)  reviewed labs  Orders: TLB-BMP (Basic Metabolic Panel-BMET)  (80048-METABOL)  Problem # 3:  HYPERLIPIDEMIA (ICD-272.4)  no need for rx at this time Labs Reviewed: SGOT: 24 (11/25/2009)   SGPT: 19 (11/25/2009)   HDL:40.30 (02/04/2010), 36.90 (11/25/2009)  LDL:119 (07/15/2009), 127 (01/11/2009)  Chol:227 (02/04/2010), 247 (11/25/2009)  Trig:133.0 (02/04/2010), 161.0 (11/25/2009)  Orders: TLB-Hepatic/Liver Function Pnl (80076-HEPATIC) TLB-Lipid Panel (80061-LIPID) TLB-TSH (Thyroid Stimulating Hormone) (84443-TSH)  Complete Medication List: 1)  Aciphex 20 Mg Tbec (Rabeprazole sodium) .... One as needed once daily 2)  Triamterene-hctz 37.5-25 Mg Tabs (Triamterene-hctz) .... One by mouth daily 3)  Cyanocobalamin 1000 Mcg/ml Inj Soln (Cyanocobalamin) .Marland Kitchen.. every month 4)  Tramadol Hcl 50 Mg Tabs (Tramadol hcl) .... Take 2  two times a day 5)  Polyethylene Glycol 3350 Powd (Polyethylene glycol 3350) .... Every hs 6)  Losartan Potassium 50 Mg Tabs (Losartan potassium) .Marland Kitchen.. 1 by mouth once daily. 7)  Allopurinol 300 Mg Tabs (Allopurinol) .... Take 1 tablet by mouth once a day  Other Orders: Venipuncture (16109) TLB-Uric Acid, Blood (84550-URIC) TLB-CBC Platelet - w/Differential (85025-CBCD) TLB-PSA (Prostate Specific Antigen) (84153-PSA)  Appended Document: Orders Update    Clinical Lists Changes  Orders: Added new Service order of Specimen Handling (60454) - Signed

## 2010-12-02 NOTE — Assessment & Plan Note (Signed)
Summary: FU per MD, stopped lipitor/et   Vital Signs:  Patient profile:   65 year old male Weight:      189 pounds Temp:     97.7 degrees F Pulse rate:   60 / minute Resp:     12 per minute BP sitting:   98 / 64  (left arm)  Vitals Entered By: Gladis Riffle, RN (November 25, 2009 10:56 AM)   History of Present Illness: myalgias---? related to lipitor---unusual in that pain was limited only to right upper arm---no rash,  B12---monthly injections  HTN---tolerating meds without difficulty  All other systems reviewed and were negative   Preventive Screening-Counseling & Management  Alcohol-Tobacco     Smoking Status: never  Current Problems (verified): 1)  Anemia, Other Unspec  (ICD-285.9) 2)  Preventive Health Care  (ICD-V70.0) 3)  Hypertension  (ICD-401.9) 4)  Hyperlipidemia  (ICD-272.4) 5)  Gerd  (ICD-530.81) 6)  B12 Deficiency  (ICD-266.2)  Current Medications (verified): 1)  Aciphex 20 Mg Tbec (Rabeprazole Sodium) .... One As Needed Once Daily 2)  Triamterene-Hctz 37.5-25 Mg Tabs (Triamterene-Hctz) .... One By Mouth Daily 3)  Cyanocobalamin 1000 Mcg/ml Inj Soln (Cyanocobalamin) .Marland Kitchen.. Every Month 4)  Tramadol Hcl 50 Mg Tabs (Tramadol Hcl) .... Take 2  Two Times A Day 5)  Polyethylene Glycol 3350  Powd (Polyethylene Glycol 3350) .... Every Hs 6)  Etodolac 200 Mg Caps (Etodolac) .... One By Mouth Q Day 7)  Losartan Potassium 50 Mg Tabs (Losartan Potassium) .Marland Kitchen.. 1 By Mouth Once Daily.  Allergies (verified): No Known Drug Allergies  Comments:  Nurse/Medical Assistant: FU per MD, stopped lipitor due to muscle pain, some better since stopped  The patient's medications and allergies were reviewed with the patient and were updated in the Medication and Allergy Lists. Gladis Riffle, RN (November 25, 2009 10:57 AM)  Past History:  Past Medical History: Last updated: 07/13/2007 B12 deficiency osteoarthritis GERD Hyperlipidemia Hypertension  Past Surgical  History: Last updated: 07/13/2007 back surgery  Family History: Last updated: 29-Nov-2007 father deceased young mother deceased young-unknown cause for both  Social History: Last updated: 11/29/2007 Married Never Smoked Alcohol use-no Regular exercise-no  Risk Factors: Exercise: no (08/02/2009)  Risk Factors: Smoking Status: never (11/25/2009)  Physical Exam  General:  alert and well-developed.   Head:  normocephalic and atraumatic.   Eyes:  pupils equal and pupils round.   Ears:  R ear normal and L ear normal.   Neck:  supple, full ROM, and no masses.   Chest Wall:  No deformities, masses, tenderness or gynecomastia noted. Lungs:  normal respiratory effort and no accessory muscle use.   Heart:  normal rate and regular rhythm.   Abdomen:  soft, non-tender, normal bowel sounds, no distention, no masses, no guarding, and no rigidity, lipoma on right shoulder   Msk:  no joint tenderness, no joint swelling, no joint warmth, no redness over joints, and no joint deformities.   Pulses:  R radial normal and L radial normal.   Neurologic:  cranial nerves II-XII intact and gait normal.   Skin:  turgor normal and color normal.   Psych:  memory intact for recent and remote and normally interactive.     Impression & Recommendations:  Problem # 1:  HYPERTENSION (ICD-401.9)  well controlled continue current medications  His updated medication list for this problem includes:    Triamterene-hctz 37.5-25 Mg Tabs (Triamterene-hctz) ..... One by mouth daily    Losartan Potassium 50 Mg Tabs (Losartan potassium) .Marland KitchenMarland KitchenMarland KitchenMarland Kitchen  1 by mouth once daily.  BP today: 98/64 Prior BP: 120/80 (08/02/2009)  Labs Reviewed: K+: 4.2 (07/15/2009) Creat: : 1.7 (07/15/2009)   Chol: 184 (07/15/2009)   HDL: 34.50 (07/15/2009)   LDL: 119 (07/15/2009)   TG: 153.0 (07/15/2009)  Orders: Venipuncture (13086) TLB-BMP (Basic Metabolic Panel-BMET) (80048-METABOL)  Problem # 2:  HYPERLIPIDEMIA (ICD-272.4) off  statins currently  check labs   Labs Reviewed: SGOT: 25 (07/15/2009)   SGPT: 17 (07/15/2009)   HDL:34.50 (07/15/2009), 35.3 (01/11/2009)  LDL:119 (07/15/2009), 127 (01/11/2009)  Chol:184 (07/15/2009), 191 (01/11/2009)  Trig:153.0 (07/15/2009), 146 (01/11/2009)  Problem # 3:  B12 DEFICIENCY (ICD-266.2) monthly b12  Problem # 4:  GERD (ICD-530.81) well controlled continue current medications  His updated medication list for this problem includes:    Aciphex 20 Mg Tbec (Rabeprazole sodium) ..... One as needed once daily  Complete Medication List: 1)  Aciphex 20 Mg Tbec (Rabeprazole sodium) .... One as needed once daily 2)  Triamterene-hctz 37.5-25 Mg Tabs (Triamterene-hctz) .... One by mouth daily 3)  Cyanocobalamin 1000 Mcg/ml Inj Soln (Cyanocobalamin) .Marland Kitchen.. every month 4)  Tramadol Hcl 50 Mg Tabs (Tramadol hcl) .... Take 2  two times a day 5)  Polyethylene Glycol 3350 Powd (Polyethylene glycol 3350) .... Every hs 6)  Etodolac 200 Mg Caps (Etodolac) .... One by mouth q day 7)  Losartan Potassium 50 Mg Tabs (Losartan potassium) .Marland Kitchen.. 1 by mouth once daily.  Other Orders: TLB-Lipid Panel (80061-LIPID) TLB-Hepatic/Liver Function Pnl (80076-HEPATIC)

## 2010-12-02 NOTE — Assessment & Plan Note (Signed)
Summary: B12 INJ/NJR  Nurse Visit   Allergies: 1)  ! Nsaids  Medication Administration  Injection # 1:    Medication: Vit B12 1000 mcg    Diagnosis: ANEMIA, OTHER UNSPEC (ICD-285.9)    Route: IM    Site: L deltoid    Exp Date: 06/01/2012    Lot #: [id number]    Mfr: American Regent    Patient tolerated injection without complications    Given by: Willy Eddy, LPN (October 07, 2010 1:53 PM)  Orders Added: 1)  Vit B12 1000 mcg [J3420] 2)  Admin of Therapeutic Inj  intramuscular or subcutaneous [96295]

## 2010-12-02 NOTE — Assessment & Plan Note (Signed)
Summary: B-12 INJ/RCD  Nurse Visit   Allergies: No Known Drug Allergies  Medication Administration  Injection # 1:    Medication: Vit B12 1000 mcg    Diagnosis: B12 DEFICIENCY (ICD-266.2)    Route: IM    Site: L deltoid    Exp Date: 07/04/2011    Lot #: [id number]    Mfr: American Regent    Patient tolerated injection without complications    Given by: Gladis Riffle, RN (Mar 05, 2010 2:02 PM)  Orders Added: 1)  Vit B12 1000 mcg [J3420] 2)  Admin of Therapeutic Inj  intramuscular or subcutaneous [96372]   Medication Administration  Injection # 1:    Medication: Vit B12 1000 mcg    Diagnosis: B12 DEFICIENCY (ICD-266.2)    Route: IM    Site: L deltoid    Exp Date: 07/04/2011    Lot #: [id number]    Mfr: American Regent    Patient tolerated injection without complications    Given by: Gladis Riffle, RN (Mar 05, 2010 2:02 PM)  Orders Added: 1)  Vit B12 1000 mcg [J3420] 2)  Admin of Therapeutic Inj  intramuscular or subcutaneous [86578]

## 2010-12-02 NOTE — Assessment & Plan Note (Signed)
Summary: knot on elbow/njr   Vital Signs:  Patient profile:   65 year old male Weight:      188 pounds Temp:     97.6 degrees F oral Pulse rate:   60 / minute BP sitting:   110 / 80  (left arm) Cuff size:   regular  Vitals Entered By: Kathrynn Speed CMA (June 17, 2010 11:18 AM)  Procedure Note Last Tetanus: Td (01/18/2009)  Injections: Duration of symptoms: 3 weeks Indication: acute pain  Procedure # 1: joint aspiration & injection    Medication: 20 mg depomedrol    Anesthesia: 1% lidocaine w/o epinephrine    Comment: 10 cc of blood-tinged fluid removed.  CC: fuild on left elbow, x one month, no pain, maybe from gout,src Is Patient Diabetic? No   CC:  fuild on left elbow, x one month, no pain, maybe from gout, and src.  History of Present Illness: swelling of left elbow for 2-3 weeks. Normally painful. No trauma. No warmth to the area. Has full range of motion of the elbow. No associated symptoms.  Denies fevers, chills, sweats.  Preventive Screening-Counseling & Management  Alcohol-Tobacco     Smoking Status: never  Current Medications (verified): 1)  Aciphex 20 Mg Tbec (Rabeprazole Sodium) .... One As Needed Once Daily 2)  Triamterene-Hctz 37.5-25 Mg Tabs (Triamterene-Hctz) .... One By Mouth Daily 3)  Cyanocobalamin 1000 Mcg/ml Inj Soln (Cyanocobalamin) .Marland Kitchen.. Every Month 4)  Tramadol Hcl 50 Mg Tabs (Tramadol Hcl) .... Take 2  Two Times A Day 5)  Polyethylene Glycol 3350  Powd (Polyethylene Glycol 3350) .... Every Hs 6)  Losartan Potassium 50 Mg Tabs (Losartan Potassium) .Marland Kitchen.. 1 By Mouth Once Daily. 7)  Allopurinol 300 Mg Tabs (Allopurinol) .... Take 1 Tablet By Mouth Once A Day  Allergies (verified): 1)  ! Nsaids  Past History:  Past Medical History: Last updated: 04/25/2010 B12 deficiency osteoarthritis GERD Hyperlipidemia Hypertension gout  Past Surgical History: Last updated: 07/13/2007 back surgery  Family History: Last updated:  2007/11/22 father deceased young mother deceased young-unknown cause for both  Social History: Last updated: 2007-11-22 Married Never Smoked Alcohol use-no Regular exercise-no  Risk Factors: Exercise: no (08/02/2009)  Risk Factors: Smoking Status: never (06/17/2010)  Physical Exam  General:  Well-developed,well-nourished,in no acute distress; alert,appropriate and cooperative throughout examination Head:  normocephalic and atraumatic.   Msk:  full branch of motion of both helpless. Olecranon bursa is with effusion.   Impression & Recommendations:  Problem # 1:  GOUT, UNSPECIFIED (ICD-274.9) could be because of ultrasound bursitis. Discussed treatment options. He gives verbal consent proceed with aspiration of bursa. His updated medication list for this problem includes:    Allopurinol 300 Mg Tabs (Allopurinol) .Marland Kitchen... Take 1 tablet by mouth once a day  Complete Medication List: 1)  Aciphex 20 Mg Tbec (Rabeprazole sodium) .... One as needed once daily 2)  Triamterene-hctz 37.5-25 Mg Tabs (Triamterene-hctz) .... One by mouth daily 3)  Cyanocobalamin 1000 Mcg/ml Inj Soln (Cyanocobalamin) .Marland Kitchen.. every month 4)  Tramadol Hcl 50 Mg Tabs (Tramadol hcl) .... Take 2  two times a day 5)  Polyethylene Glycol 3350 Powd (Polyethylene glycol 3350) .... Every hs 6)  Losartan Potassium 50 Mg Tabs (Losartan potassium) .Marland Kitchen.. 1 by mouth once daily. 7)  Allopurinol 300 Mg Tabs (Allopurinol) .... Take 1 tablet by mouth once a day  Other Orders: Joint Aspirate / Injection, Intermediate (30160) Depo- Medrol 40mg  (J1030) Prescriptions: LOSARTAN POTASSIUM 50 MG TABS (LOSARTAN POTASSIUM) 1 by  mouth once daily.  #90 x 3   Entered by:   Gladis Riffle, RN   Authorized by:   Birdie Sons MD   Signed by:   Gladis Riffle, RN on 06/19/2010   Method used:   [facility] [address] [phone]* (retail)       [address]       [place], [place]  [address]        Ph: [phone] or [phone]       Fax: [phone]   RxID:   [id number]

## 2010-12-02 NOTE — Progress Notes (Signed)
Summary: question about cholesterol  Phone Note Call from Patient Call back at Home Phone [phone]   Caller: Patient, in person from lab Call For: Birdie Sons MD Summary of Call: stopped lipitor last wed due to arm and shoulder pain with relief.  Do you want lipid panel added to today's lab?  should he restart lipitor?  Next appt 02/04/10 Initial call taken by: Gladis Riffle, RN,  November 07, 2009 10:59 AM  Follow-up for Phone Call        perdr Shekelia Boutin, no added lipid panel and no restart of lipitor, but wants to see pt in next couple weeks. Follow-up by: Gladis Riffle, RN,  November 07, 2009 10:59 AM  Additional Follow-up for Phone Call Additional follow up Details #1::        Appt made.Patient notified.  Additional Follow-up by: Gladis Riffle, RN,  November 07, 2009 11:05 AM

## 2010-12-02 NOTE — Assessment & Plan Note (Signed)
Summary: B-12 INJ/RCD  Nurse Visit   Vitals Entered By: Raechel Ache, RN (April 07, 2010 1:45 PM)  Allergies: No Known Drug Allergies  Medication Administration  Injection # 1:    Medication: Vit B12 1000 mcg    Diagnosis: B12 DEFICIENCY (ICD-266.2)    Route: IM    Site: R deltoid    Exp Date: 09/12    Lot #: [id number]    Mfr: American Regent    Patient tolerated injection without complications    Given by: Raechel Ache, RN (April 07, 2010 1:46 PM)  Orders Added: 1)  Vit B12 1000 mcg [J3420] 2)  Admin of Therapeutic Inj  intramuscular or subcutaneous [40981]

## 2010-12-02 NOTE — Assessment & Plan Note (Signed)
Summary: B12 INJ/NJR  Nurse Visit   Allergies: No Known Drug Allergies  Medication Administration  Injection # 1:    Medication: Vit B12 1000 mcg    Diagnosis: B12 DEFICIENCY (ICD-266.2)    Route: IM    Site: L deltoid    Exp Date: 07/04/2011    Lot #: [id number]    Mfr: [employer] Regent    Patient tolerated injection without complications    Given by: Gladis Riffle, RN (January 06, 2010 1:48 PM)  Orders Added: 1)  Vit B12 1000 mcg [J3420] 2)  Admin of Therapeutic Inj  intramuscular or subcutaneous [96372]   Medication Administration  Injection # 1:    Medication: Vit B12 1000 mcg    Diagnosis: B12 DEFICIENCY (ICD-266.2)    Route: IM    Site: L deltoid    Exp Date: 07/04/2011    Lot #: [id number]    Mfr: American Regent    Patient tolerated injection without complications    Given by: Gladis Riffle, RN (January 06, 2010 1:48 PM)  Orders Added: 1)  Vit B12 1000 mcg [J3420] 2)  Admin of Therapeutic Inj  intramuscular or subcutaneous [[id number]]

## 2010-12-02 NOTE — Assessment & Plan Note (Signed)
Summary: b12 inj//ccm  Nurse Visit   Allergies: 1)  ! Nsaids  Appended Document: b12 inj//ccm    Clinical Lists Changes  Orders: Added new Service order of Vit B12 1000 mcg (X3235) - Signed Added new Service order of Admin of Therapeutic Inj  intramuscular or subcutaneous (57322) - Signed       Medication Administration  Injection # 1:    Medication: Vit B12 1000 mcg    Diagnosis: ANEMIA, OTHER UNSPEC (ICD-285.9)    Route: IM    Site: L deltoid    Exp Date: 04/02/2012    Lot #: [id number]    Mfr: American Regent    Patient tolerated injection without complications    Given by: Kern Reap CMA Duncan Dull) (July 08, 2010 3:53 PM)  Orders Added: 1)  Vit B12 1000 mcg [J3420] 2)  Admin of Therapeutic Inj  intramuscular or subcutaneous [[id number]]

## 2010-12-02 NOTE — Assessment & Plan Note (Signed)
Summary: Swollen elbow, thinks is bursitis/et   Vital Signs:  Patient profile:   65 year old male Weight:      193 pounds BMI:     28.60 Temp:     98.2 degrees F oral BP sitting:   130 / 74  (right arm) Cuff size:   regular  Vitals Entered By: Raechel Ache, RN (April 25, 2010 1:22 PM) CC: C/o L elbow pain and swelling.   History of Present Illness: Here for 3 days of pain and swelling at the tip of the left elbow. No recent trauma. Using ice and Trmamdol. He gets gout in his big toe occasionally, but he says this feels very different from gout.   Allergies (verified): No Known Drug Allergies  Past History:  Past Medical History: B12 deficiency osteoarthritis GERD Hyperlipidemia Hypertension gout  Past Surgical History: Reviewed history from 07/13/2007 and no changes required. back surgery  Review of Systems  The patient denies anorexia, fever, weight loss, weight gain, vision loss, decreased hearing, hoarseness, chest pain, syncope, dyspnea on exertion, peripheral edema, prolonged cough, headaches, hemoptysis, abdominal pain, melena, hematochezia, severe indigestion/heartburn, hematuria, incontinence, genital sores, muscle weakness, suspicious skin lesions, transient blindness, difficulty walking, depression, unusual weight change, abnormal bleeding, enlarged lymph nodes, angioedema, breast masses, and testicular masses.    Physical Exam  General:  Well-developed,well-nourished,in no acute distress; alert,appropriate and cooperative throughout examination Extremities:  the left elbow is red and swollen and warm and tender, especially over the olecranon bursa. ROM is decreased from pain.    Impression & Recommendations:  Problem # 1:  BURSITIS (ICD-727.3)  Complete Medication List: 1)  Aciphex 20 Mg Tbec (Rabeprazole sodium) .... One as needed once daily 2)  Triamterene-hctz 37.5-25 Mg Tabs (Triamterene-hctz) .... One by mouth daily 3)  Cyanocobalamin 1000 Mcg/ml  Inj Soln (Cyanocobalamin) .Marland Kitchen.. every month 4)  Tramadol Hcl 50 Mg Tabs (Tramadol hcl) .... Take 2  two times a day 5)  Polyethylene Glycol 3350 Powd (Polyethylene glycol 3350) .... Every hs 6)  Etodolac 200 Mg Caps (Etodolac) .... One by mouth q day 7)  Losartan Potassium 50 Mg Tabs (Losartan potassium) .Marland Kitchen.. 1 by mouth once daily. 8)  Allopurinol 300 Mg Tabs (Allopurinol) .... Take 1 tablet by mouth once a day 9)  Prednisone (pak) 10 Mg Tabs (Prednisone) .... As directed for 12 days  Patient Instructions: 1)  Rest, ice. Prednisone dose pack.  2)  Please schedule a follow-up appointment as needed .  Prescriptions: PREDNISONE (PAK) 10 MG TABS (PREDNISONE) as directed for 12 days  #1 x 0   Entered and Authorized by:   Nelwyn Salisbury MD   Signed by:   Nelwyn Salisbury MD on 04/25/2010   Method used:   [facility]        [facility] [phone]       [address]       [place], [place]  [address]       Ph: [phone] or [phone]       Fax: [phone]   RxID:   [id number]

## 2010-12-02 NOTE — Progress Notes (Signed)
Summary: B12 Inj / [facility]  B12 Inj / [facility]   Imported By: Lennie Odor 04/24/2010 11:20:21  _____________________________________________________________________  External Attachment:    Type:   Image     Comment:   External Document

## 2010-12-02 NOTE — Assessment & Plan Note (Signed)
Summary: b12 inj//ccm---PT RSC (BMP) // RS  Nurse Visit   Allergies: 1)  ! Nsaids  Medication Administration  Injection # 1:    Medication: Vit B12 1000 mcg    Diagnosis: ANEMIA, OTHER UNSPEC (ICD-285.9)    Route: IM    Site: R deltoid    Exp Date: 05/02/2012    Lot #: [id number]    Mfr: American Regent    Patient tolerated injection without complications    Given by: Willy Eddy, LPN (August 07, 2010 11:58 AM)  Orders Added: 1)  Vit B12 1000 mcg [J3420] 2)  Admin of Therapeutic Inj  intramuscular or subcutaneous [[id number]]

## 2010-12-02 NOTE — Assessment & Plan Note (Signed)
Summary: B-12 INJ/RCD  Nurse Visit   Allergies: No Known Drug Allergies  Medication Administration  Injection # 1:    Medication: Vit B12 1000 mcg    Diagnosis: B12 DEFICIENCY (ICD-266.2)    Route: IM    Site: L deltoid    Exp Date: 02/01/2011    Lot #: [id number]    Mfr: American Regent    Patient tolerated injection without complications    Given by: Gladis Riffle, RN (December 09, 2009 3:17 PM)  Orders Added: 1)  Vit B12 1000 mcg [J3420] 2)  Admin of Therapeutic Inj  intramuscular or subcutaneous [[id number]]   Medication Administration  Injection # 1:    Medication: Vit B12 1000 mcg    Diagnosis: B12 DEFICIENCY (ICD-266.2)    Route: IM    Site: L deltoid    Exp Date: 02/01/2011    Lot #: [id number]    Mfr: American Regent    Patient tolerated injection without complications    Given by: Gladis Riffle, RN (December 09, 2009 3:17 PM)  Orders Added: 1)  Vit B12 1000 mcg [J3420] 2)  Admin of Therapeutic Inj  intramuscular or subcutaneous [[id number]]

## 2010-12-02 NOTE — Assessment & Plan Note (Signed)
Summary: 6 mo rov/mm---ADD B12//CCM   Vital Signs:  Patient profile:   65 year old Scott [name] Weight:      190 pounds Temp:     98 degrees F oral Pulse rate:   56 / minute Pulse rhythm:   regular Resp:     12 per minute BP sitting:   106 / 68  (left arm) Cuff size:   regular  Vitals Entered By: Gladis Riffle, RN (February 04, 2010 10:34 AM) CC: 6 month rov--c/o right shoulder pain--fasting Is Patient Diabetic? No   CC:  6 month rov--c/o right shoulder pain--fasting.  History of Present Illness:  Follow-Up Visit      This is a 65 year old man who presents for Follow-up visit.  The patient denies chest pain and palpitations.  Since the last visit the patient notes no new problems or concerns.  The patient reports taking meds as prescribed.  When questioned about possible medication side effects, the patient notes none.    All other systems reviewed and were negative   Preventive Screening-Counseling & Management  Alcohol-Tobacco     Smoking Status: never  Current Problems (verified): 1)  Anemia, Other Unspec  (ICD-285.9) 2)  Preventive Health Care  (ICD-V70.0) 3)  Hypertension  (ICD-401.9) 4)  Hyperlipidemia  (ICD-272.4) 5)  Gerd  (ICD-530.81) 6)  B12 Deficiency  (ICD-266.2)  Current Medications (verified): 1)  Aciphex 20 Mg Tbec (Rabeprazole Sodium) .... One As Needed Once Daily 2)  Triamterene-Hctz 37.5-25 Mg Tabs (Triamterene-Hctz) .... One By Mouth Daily 3)  Cyanocobalamin 1000 Mcg/ml Inj Soln (Cyanocobalamin) .Marland Kitchen.. Every Month 4)  Tramadol Hcl 50 Mg Tabs (Tramadol Hcl) .... Take 2  Two Times A Day 5)  Polyethylene Glycol 3350  Powd (Polyethylene Glycol 3350) .... Every Hs 6)  Etodolac 200 Mg Caps (Etodolac) .... One By Mouth Q Day 7)  Losartan Potassium 50 Mg Tabs (Losartan Potassium) .Marland Kitchen.. 1 By Mouth Once Daily.  Allergies (verified): No Known Drug Allergies  Past History:  Past Medical History: Last updated: 07/13/2007 B12  deficiency osteoarthritis GERD Hyperlipidemia Hypertension  Past Surgical History: Last updated: 07/13/2007 back surgery  Family History: Last updated: 2007-11-23 father deceased young mother deceased young-unknown cause for both  Social History: Last updated: 2007/11/23 Married Never Smoked Alcohol use-no Regular exercise-no  Risk Factors: Exercise: no (08/02/2009)  Risk Factors: Smoking Status: never (02/04/2010)  Review of Systems       All other systems reviewed and were negative   Physical Exam  General:  alert and well-developed.   Head:  normocephalic and atraumatic.   Eyes:  pupils equal and pupils round.   Ears:  R ear normal and L ear normal.   Neck:  supple, full ROM, and no masses.   Chest Wall:  No deformities, masses, tenderness or gynecomastia noted. Lungs:  normal respiratory effort and no accessory muscle use.   Heart:  normal rate and regular rhythm.   Abdomen:  soft, non-tender, normal bowel sounds, no distention, no masses, no guarding, and no rigidity,  Msk:  no joint tenderness, no joint swelling, no joint warmth, no redness over joints, and no joint deformities.   Neurologic:  cranial nerves II-XII intact and gait normal.   Skin:  turgor normal and color normal.   Psych:  good eye contact and not anxious appearing.     Impression & Recommendations:  Problem # 1:  ANEMIA, OTHER UNSPEC (ICD-285.9)  check labs today His updated medication list for this problem includes:  Cyanocobalamin 1000 Mcg/ml Inj Soln (Cyanocobalamin) .Marland KitchenMarland KitchenMarland KitchenMarland Kitchen every month  Hgb: 12.5 (11/07/2009)   Hct: Scott.6 (11/07/2009)   Platelets: 184.0 (11/07/2009) RBC: 4.56 (11/07/2009)   RDW: 14.0 (11/07/2009)   WBC: 3.7 (11/07/2009) MCV: 84.5 (11/07/2009)   MCHC: 32.3 (11/07/2009) Ferritin: 123.2 (08/02/2009) B12: 474 (08/02/2009)   Folate: 7.7 (08/02/2009)   TSH: 1.67 (07/15/2009)  Orders: TLB-CBC Platelet - w/Differential (85025-CBCD)  Problem # 2:  HYPERTENSION  (ICD-401.9)  controlled continue current medications  His updated medication list for this problem includes:    Triamterene-hctz 37.5-25 Mg Tabs (Triamterene-hctz) ..... One by mouth daily    Losartan Potassium 50 Mg Tabs (Losartan potassium) .Marland Kitchen... 1 by mouth once daily.  BP today: 106/68 Prior BP: 98/64 (11/25/2009)  Labs Reviewed: K+: 4.2 (11/25/2009) Creat: : 1.7 (11/25/2009)   Chol: 247 (11/25/2009)   HDL: 36.90 (11/25/2009)   LDL: 119 (07/15/2009)   TG: 161.0 (11/25/2009)  Orders: TLB-BMP (Basic Metabolic Panel-BMET) (80048-METABOL)  Problem # 3:  B12 DEFICIENCY (ICD-266.2) monthly b12 injections Orders: Vit B12 1000 mcg (J3420) Admin of Therapeutic Inj  intramuscular or subcutaneous (10272)  Problem # 4:  GERD (ICD-530.81) controlled continue current medications  His updated medication list for this problem includes:    Aciphex 20 Mg Tbec (Rabeprazole sodium) ..... One as needed once daily  Complete Medication List: 1)  Aciphex 20 Mg Tbec (Rabeprazole sodium) .... One as needed once daily 2)  Triamterene-hctz 37.5-25 Mg Tabs (Triamterene-hctz) .... One by mouth daily 3)  Cyanocobalamin 1000 Mcg/ml Inj Soln (Cyanocobalamin) .Marland Kitchen.. every month 4)  Tramadol Hcl 50 Mg Tabs (Tramadol hcl) .... Take 2  two times a day 5)  Polyethylene Glycol 3350 Powd (Polyethylene glycol 3350) .... Every hs 6)  Etodolac 200 Mg Caps (Etodolac) .... One by mouth q day 7)  Losartan Potassium 50 Mg Tabs (Losartan potassium) .Marland Kitchen.. 1 by mouth once daily.  Other Orders: Venipuncture (53664) TLB-Lipid Panel (80061-LIPID) TLB-TSH (Thyroid Stimulating Hormone) (84443-TSH)  Patient Instructions: 1)  Please schedule a follow-up appointment in 4 months.   Medication Administration  Injection # 1:    Medication: Vit B12 1000 mcg    Diagnosis: B12 DEFICIENCY (ICD-266.2)    Route: IM    Site: L deltoid    Exp Date: 07/04/2011    Lot #: [id number]    Mfr: American Regent    Patient tolerated  injection without complications    Given by: Gladis Riffle, RN (February 04, 2010 10:40 AM)  Orders Added: 1)  Vit B12 1000 mcg [J3420] 2)  Admin of Therapeutic Inj  intramuscular or subcutaneous [96372] 3)  Est. Patient Level IV [74259] 4)  Venipuncture [36415] 5)  TLB-Lipid Panel [80061-LIPID] 6)  TLB-TSH (Thyroid Stimulating Hormone) [84443-TSH] 7)  TLB-BMP (Basic Metabolic Panel-BMET) [80048-METABOL] 8)  TLB-CBC Platelet - w/Differential [85025-CBCD]

## 2010-12-04 ENCOUNTER — Ambulatory Visit: Admit: 2010-12-04 | Payer: Self-pay | Admitting: Internal Medicine

## 2010-12-04 ENCOUNTER — Ambulatory Visit (INDEPENDENT_AMBULATORY_CARE_PROVIDER_SITE_OTHER): Payer: MEDICARE | Admitting: Internal Medicine

## 2010-12-04 DIAGNOSIS — D649 Anemia, unspecified: Secondary | ICD-10-CM

## 2010-12-04 MED ORDER — CYANOCOBALAMIN 1000 MCG/ML IJ SOLN
1000.0000 ug | INTRAMUSCULAR | Status: AC
  Administered 2010-12-04 – 2011-11-09 (×10): 1000 ug via INTRAMUSCULAR

## 2010-12-04 NOTE — Assessment & Plan Note (Signed)
Summary: b12 inj//ccm  Nurse Visit   Allergies: 1)  ! Nsaids  Medication Administration  Injection # 1:    Medication: Vit B12 1000 mcg    Diagnosis: ANEMIA, OTHER UNSPEC (ICD-285.9)    Route: IM    Site: R deltoid    Exp Date: 7/13    Lot #: [id number]    Mfr: American Regent    Patient tolerated injection without complications    Given by: Alfred Levins, CMA (November 06, 2010 1:30 PM)  Orders Added: 1)  Vit B12 1000 mcg [J3420] 2)  Admin of Therapeutic Inj  intramuscular or subcutaneous [09811]

## 2010-12-17 ENCOUNTER — Other Ambulatory Visit: Payer: Self-pay | Admitting: Internal Medicine

## 2010-12-29 ENCOUNTER — Encounter: Payer: Self-pay | Admitting: Internal Medicine

## 2010-12-29 ENCOUNTER — Ambulatory Visit (INDEPENDENT_AMBULATORY_CARE_PROVIDER_SITE_OTHER): Payer: MEDICARE | Admitting: Internal Medicine

## 2010-12-29 DIAGNOSIS — E538 Deficiency of other specified B group vitamins: Secondary | ICD-10-CM

## 2010-12-29 DIAGNOSIS — E785 Hyperlipidemia, unspecified: Secondary | ICD-10-CM

## 2010-12-29 DIAGNOSIS — M109 Gout, unspecified: Secondary | ICD-10-CM

## 2010-12-29 DIAGNOSIS — D649 Anemia, unspecified: Secondary | ICD-10-CM

## 2010-12-29 DIAGNOSIS — I10 Essential (primary) hypertension: Secondary | ICD-10-CM

## 2010-12-29 LAB — HEPATIC FUNCTION PANEL
AST: 28 U/L (ref 0–37)
Albumin: 4.3 g/dL (ref 3.5–5.2)
Alkaline Phosphatase: 61 U/L (ref 39–117)
Total Bilirubin: 0.6 mg/dL (ref 0.3–1.2)

## 2010-12-29 LAB — BASIC METABOLIC PANEL
BUN: 18 mg/dL (ref 6–23)
Chloride: 100 mEq/L (ref 96–112)
Glucose, Bld: 91 mg/dL (ref 70–99)
Potassium: 4 mEq/L (ref 3.5–5.1)

## 2010-12-29 LAB — CBC WITH DIFFERENTIAL/PLATELET
Basophils Relative: 0.4 % (ref 0.0–3.0)
HCT: 38.6 % — ABNORMAL LOW (ref 39.0–52.0)
Hemoglobin: 12.9 g/dL — ABNORMAL LOW (ref 13.0–17.0)
Lymphocytes Relative: 34.6 % (ref 12.0–46.0)
Lymphs Abs: 1.3 10*3/uL (ref 0.7–4.0)
Monocytes Relative: 7.8 % (ref 3.0–12.0)
Neutro Abs: 2 10*3/uL (ref 1.4–7.7)
RBC: 4.55 Mil/uL (ref 4.22–5.81)
RDW: 16 % — ABNORMAL HIGH (ref 11.5–14.6)

## 2010-12-29 LAB — LDL CHOLESTEROL, DIRECT: Direct LDL: 175.9 mg/dL

## 2010-12-29 LAB — LIPID PANEL
HDL: 37.4 mg/dL — ABNORMAL LOW (ref >39.00)
Total CHOL/HDL Ratio: 6
Triglycerides: 133 mg/dL (ref 0.0–149.0)
VLDL: 26.6 mg/dL (ref 0.0–40.0)

## 2010-12-29 LAB — TSH: TSH: 1.22 u[IU]/mL (ref 0.35–5.50)

## 2010-12-29 NOTE — Assessment & Plan Note (Signed)
Monthly b12 injections 

## 2010-12-29 NOTE — Assessment & Plan Note (Signed)
No recurrence Continue same meds 

## 2010-12-29 NOTE — Assessment & Plan Note (Signed)
Check labs today Continue b12 injections anemial could be contributed to by renal insufficiency

## 2010-12-29 NOTE — Assessment & Plan Note (Signed)
Adequate control. 

## 2010-12-29 NOTE — Progress Notes (Signed)
  Subjective:    Patient ID: Scott [name], male    DOB: 10-18-1946, 65 y.o.   MRN: [id number]  HPI Gout: no recurrence. Using allopurinol every day  Htn: tolerating meds  Renal insufficiency---thought related to htn  Anemia--needs f/u. ? Renal insuff related  Past Medical History  Diagnosis Date  . B12 DEFICIENCY 07/07/2007  . HYPERLIPIDEMIA 07/13/2007  . Gout, unspecified 06/17/2010  . ANEMIA, OTHER UNSPEC 08/02/2009  . HYPERTENSION 07/13/2007  . GERD 07/13/2007  . RENAL INSUFFICIENCY 06/04/2010   Past Surgical History  Procedure Date  . Back surgery 07/13/07    reports that he has never smoked. He does not have any smokeless tobacco history on file. He reports that he does not drink alcohol or use illicit drugs. family history is not on file.    Allergies  Allergen Reactions  . Nsaids     REACTION: hx of renal insufficiency      Review of Systems  patient denies chest pain, shortness of breath, orthopnea. Denies lower extremity edema, abdominal pain, change in appetite, change in bowel movements. Patient denies rashes, musculoskeletal complaints. No other specific complaints in a complete review of systems.      Objective:   Physical Exam  well-developed well-nourished male in no acute distress. HEENT exam atraumatic, normocephalic, neck supple without jugular venous distention. Chest clear to auscultation cardiac exam S1-S2 are regular. Abdominal exam overweight with bowel sounds, soft and nontender. Extremities no edema. Neurologic exam is alert with a normal gait.        Assessment & Plan:

## 2010-12-29 NOTE — Assessment & Plan Note (Signed)
Will check labs today

## 2011-01-01 ENCOUNTER — Ambulatory Visit (INDEPENDENT_AMBULATORY_CARE_PROVIDER_SITE_OTHER): Payer: MEDICARE | Admitting: Internal Medicine

## 2011-01-01 DIAGNOSIS — D649 Anemia, unspecified: Secondary | ICD-10-CM

## 2011-01-01 DIAGNOSIS — E538 Deficiency of other specified B group vitamins: Secondary | ICD-10-CM

## 2011-01-12 ENCOUNTER — Telehealth: Payer: Self-pay | Admitting: Internal Medicine

## 2011-01-12 NOTE — Telephone Encounter (Signed)
Requesting refill of his fluid meds and is requesting lab results. [facility].

## 2011-01-13 ENCOUNTER — Telehealth: Payer: Self-pay | Admitting: *Deleted

## 2011-01-13 MED ORDER — TRIAMTERENE-HCTZ 37.5-25 MG PO TABS: 1.0000 | ORAL_TABLET | Freq: Every day | ORAL | Status: DC

## 2011-01-13 NOTE — Telephone Encounter (Signed)
Pt aware.

## 2011-01-13 NOTE — Telephone Encounter (Signed)
All lab is okay  Okay to refill Maxzide 25 #90

## 2011-01-13 NOTE — Telephone Encounter (Signed)
Pt needs refill of maxide 37.5/25 sent to [facility] [phone]

## 2011-01-13 NOTE — Telephone Encounter (Signed)
help

## 2011-01-21 ENCOUNTER — Other Ambulatory Visit: Payer: Self-pay | Admitting: *Deleted

## 2011-01-21 DIAGNOSIS — M109 Gout, unspecified: Secondary | ICD-10-CM

## 2011-01-21 MED ORDER — COLCHICINE 0.6 MG PO TABS: 0.6000 mg | ORAL_TABLET | Freq: Every day | ORAL | Status: DC

## 2011-02-03 ENCOUNTER — Ambulatory Visit (INDEPENDENT_AMBULATORY_CARE_PROVIDER_SITE_OTHER): Payer: MEDICARE | Admitting: Internal Medicine

## 2011-02-03 DIAGNOSIS — E538 Deficiency of other specified B group vitamins: Secondary | ICD-10-CM

## 2011-02-03 DIAGNOSIS — D649 Anemia, unspecified: Secondary | ICD-10-CM

## 2011-03-05 ENCOUNTER — Ambulatory Visit (INDEPENDENT_AMBULATORY_CARE_PROVIDER_SITE_OTHER): Payer: MEDICARE | Admitting: Internal Medicine

## 2011-03-05 DIAGNOSIS — E538 Deficiency of other specified B group vitamins: Secondary | ICD-10-CM

## 2011-03-05 DIAGNOSIS — D649 Anemia, unspecified: Secondary | ICD-10-CM

## 2011-03-17 NOTE — Assessment & Plan Note (Signed)
HISTORY:  A 65 year old male who returns today, last seen by me on  September 08, 2007.  Severe degenerative disk disease L5-S1, partial  lumbar L5-S1 section with pseudoarthrosis S1-S2, status post right L4-5  decompressive laminectomy foraminotomy microdiskectomy in June 2007.   He ran out of his Ultram a few days ago and did not get refills till  yesterday.  His left hip pain is about the worst.   He has had no new medical complications since I last saw him.   PHYSICAL EXAMINATION:  VITAL SIGNS:  Blood pressure is 100/58, pulse 62,  respirations 18, O2 sat 98% on room air.  GENERAL:  No acute distress.  MUSCULOSKELETAL:  Back with mild tenderness to palpation bilaterally.  Mild pain in PSIS.  His hip range of motion is good bilaterally.  Negative fabere maneuver.  Lower extremity strength is normal.  He is  able to toe walk/heel walk.  MENTAL STATUS:  His mood and affect are appropriate.   IMPRESSION:  1. Lumbar post laminectomy syndrome.  2. Lumbar degenerative disk disease.  3. Lumbar facet arthropathy.   PLAN:  1. Continue Ultram 2 p.o. b.i.d.  2. Continue Celebrex 100 mg a day.  3. Should the resumption of Ultram not provide him adequate relief, he      can call back and we will consider doing a lumbar injection,      epidural steroid injection.      Erick Colace, M.D.  Electronically Signed     AEK/MedQ  D:  12/09/2007 16:30:51  T:  12/11/2007 15:36:58  Job #:  [id number]   cc:   Valetta Mole. Swords, MD  [address] [place]  [place] [address]

## 2011-03-17 NOTE — Assessment & Plan Note (Signed)
Scott [name] follows up today.  I have last saw him on November 28, 2008,  when we did a right carpometacarpal joint injection which has improved  his right hand pain.  His back pain is doing quite well and has not had  any radicular discomfort.  He gets intermittent activity-related pain  which is well controlled with medications.   CURRENT PAIN MEDICATIONS:  1. Tramadol 2 p.o. b.i.d.  2. Celebrex has been changed from 100 mg a day to etodolac ER 200 mg a      day.   His Oswestry score was not performed today.   PHYSICAL EXAMINATION:  GENERAL:  Well-developed, well-nourished male in  no acute distress.  Orientation x3.  Affect is alert.  MUSCULOSKELETAL:  Gait is normal.  Normal grip strength.  Squaring off  the carpal metacarpal joints bilaterally.  His back has no tenderness to  palpation in the lumbar paraspinals.  Good range of motion.  Gait is  normal.   IMPRESSION:  1. Lumbar postlaminectomy syndrome.  2. Lumbar degenerative disk.  3. Right greater than left carpometacarpal osteoarthritis, currently      on doing well in terms of this.   PLAN:  1. Continue tramadol 2 p.o. b.i.d.  2. Dr. Cato Mulligan has been taking over on his nonsteroidals.  He will      follow up Dr. Cato Mulligan in regards to his etodolac.  3. I will see him back in 5 months.  Should his carpometacarpal pain      flareup between now and then, I would be happy to see him back      sooner.      Scott [name], M.D.  Electronically Signed     AEK/MedQ  D:  03/18/2009 10:52:06  T:  03/19/2009 00:46:04  Job #:  [id number]   cc:   Valetta Mole. Swords, MD  [address] [place]  [place] [address]

## 2011-03-17 NOTE — Assessment & Plan Note (Signed)
HISTORY:  A 65 year old male whom I saw in initial consultation Mar 07, 2007, severe degenerative disk disease L5-S1 with changes partially  lumbarized S1 segment with pseudoarthrosis S1, S2, has had very good  results with Celebrex.  He does have borderline high creatinines 1.6 in  January, and this is just above the upper limit of normal 1.5 for the  lab.  He has had no problems with urination and has had no lower  extremity swelling.  He has been very active and energetic, doing a lot  of gardening and wants to get back to walking like he used to which was  several miles per day.  He is wondering if he could reduce his Tramadol  and has been taking 100 mg t.i.d., and this has been in place of  oxycodone.  He feels better off the oxycodone as well.   PHYSICAL EXAMINATION:  VITAL SIGNS:  His mood and affect are  appropriate.  His blood pressure is 107/74, pulse 66, respiratory rate  18, O2 saturations 100% on room air.  GENERAL:  No acute distress.  Mood and affect appropriate.  Gait is  normal.  He is able to toe-walk, heel-walk.  He has normal strength in  the lower extremity and normal range of motion in the lower extremity,  deep tendon reflex, normal extremities.   IMPRESSION:  1. Lumbar degenerative disk disease, L5-S1, no evidence of      radiculopathy or neurogenic claudication.  2. Lumbar postlaminectomy syndrome, L4-5.  We will continue on      Celebrex 200 daily.  We will ask nursing to call over to Dr.      Cato Mulligan' office, get the results of his last BMET which was done      reportedly last week.  If his creatinine continues to bump up, we      will either have to stop the Celebrex and go on Ultram again or try      reducing the Celebrex to 100 mg and recheck a BMET.      Erick Colace, M.D.  Electronically Signed     AEK/MedQ  D:  04/21/2007 13:53:13  T:  04/21/2007 16:58:03  Job #:  [id number]   cc:   Valetta Mole. Swords, MD  [address] [place]  [place] [address]

## 2011-03-17 NOTE — Procedures (Signed)
NAMEDESMEN, SCHOFFSTALL               ACCOUNT NO.:  [id number]   MEDICAL RECORD NO.:  [id number]          PATIENT TYPE:  REC   LOCATION:  [facility]                          FACILITY:  [facility]   PHYSICIAN:  Erick Colace, M.D.DATE OF BIRTH:  1946/06/13   DATE OF PROCEDURE:  DATE OF DISCHARGE:                               OPERATIVE REPORT   This is a right carpometacarpal joint injection.   INDICATIONS:  Carpometacarpal arthritis with pain only partially  responsive to medication management including nonsteroidals.  Pain  interferes with activity.   Informed consent was obtained after discussing risks and benefits of the  procedure with the patient.  These include bleeding, bruising, and  infection.  He elects to proceed.  The patient placed in a seated  position.  Area over the carpometacarpal joint was marked and prepped  with Betadine.  The skin wheal was raised with 0.5 mL of 1% lidocaine  followed using a 27-gauge 5/8 inch needle followed by injection into the  joint with a 27-gauge 5/8 inch needle and a solution containing 0.25 mL  of 1% lidocaine plus a 0.25 mL of 40 mg/mL Depo-Medrol.  The patient  tolerated the procedure well.  Post injection instructions given.      Erick Colace, M.D.  Electronically Signed     AEK/MEDQ  D:  11/27/2008 13:38:09  T:  11/28/2008 03:47:53  Job:  [id number]

## 2011-03-17 NOTE — Assessment & Plan Note (Signed)
A 65 year old male with severe degenerative disk disease at L5-S1.  He  is status post L4-L5 decompression, laminectomy, foraminotomy, and  microdiskectomy at L4-L5 level in 2007.  He has no radicular discomfort.  He has flare-up of his back pain when he does yard work.  He has right  greater than left hand and wrist pain, mainly when he is off Celebrex.   He has had no new medical problems in the interval time frame.  He  continues to do his own yard work.  He had slacked off on his walking  program as well as on his V-Fit program.   His average pain is currently 7/10, which is improving compared to the 8  at last visit.  His pain interferes with activity at a mild level.  His  sleep is fair.  His pain is worse with bending and improves with rest  and medications.  He can walk 2 hours or more at a time.  He can climb  steps.  He can drive.   He is retired.   REVIEW OF SYSTEMS:  Positive for numbness and tingling.   PHYSICAL EXAMINATION:  VITAL SIGNS:  His blood pressure is 107/69, pulse  61, respirations 18, and O2 sat 97% on room air.  GENERAL:  Well-developed, well-nourished male in no acute distress.  Orientation x3.  Affect is alert.  Gait is with a shuffle, but once he  starts walking, he does okay.   His extremities show normal grip strength.  He does have squaring off  his carpometacarpal +1 each side.  He has negative Tinel's and Phalen's  and negative Finkelstein's.   His biceps and triceps strength is normal.  Deltoid strength is normal.  His back has good forward flexion, extension is limited to 75%.  His  lower extremity strength is normal.  Gait is normal.   IMPRESSION:  1. Lumbar post-laminectomy syndrome.  2. Lumbar degenerative disk.  3. Right greater than left carpometacarpal arthritis.   PLAN:  1. Continue with tramadol 2 p.o. b.i.d.  2. Continue Celebrex 100 mg per day.  He is really taking it about 5      days per week and mainly when he is doing more  activities.   I will see him back in 3-4 months.  Consider carpometacarpal injection  if this progresses.      Erick Colace, M.D.  Electronically Signed     AEK/MedQ  D:  08/23/2008 10:03:48  T:  08/24/2008 00:42:59  Job #:  [id number]

## 2011-03-17 NOTE — Consult Note (Signed)
REFERRING PHYSICIAN:  Valetta Mole. Swords, MD   REASON FOR CONSULTATION:  Chronic pain in shoulders, knees and back.   HISTORY OF PRESENT ILLNESS:  A 65 year old male who states he has had  pain for many years in his back, his shoulders and his knees.  He states  that his pain is worse with activity and first thing in the morning.  He  sleeps poorly, partially related to his pain.  He has good relief from  medications including OxyContin 20 mg b.i.d.; however, he would like to  come off of this because he does not like the side effects.  He is  taking MiraLax for chronic constipation.  He has had very good results  with Vioxx in the past; however, was taken off it not only once it came  off the market but also he reports having some liver problems related to  above.  He has not tried Celebrex but he has tried some over the counter  non-steroidal antiinflammatories which were not very helpful.   PAST SURGICAL HISTORY:  He underwent right L4/5 decompressive  laminotomy, foraminotomy and micro discectomy per Dr. Jordan Likes in June of  2007 for right L4/5 stenosis.  He did not note much change in his back  symptoms after the surgery.   SOCIAL HISTORY:  Married.  Lives with his wife.  He is retired since  2004.  He still cuts the grass and does all his housework and is  independent with all self care but once again, is concerned that he  needs to take pain medicine in order to be active.   PAST MEDICAL HISTORY:  On antihypertensive medications.   MEDICATIONS:  1. Benicar.  2. Diazide.  3. MiraLax.  4. Oxycodone.   ALLERGIES:  None known.   REVIEW OF SYSTEMS:  Otherwise, unremarkable other than above.  He denies  any radiating leg pain.  No numbness in the legs.   PHYSICAL EXAM:  GENERAL:  Well developed muscular elderly male in no  acute distress.  VITAL SIGNS:  Blood pressure 112/64, pulse 55, respiration 16.  O2 sat  96 percent on room air.  MUSCULOSKELETAL:  His motor strength is 5/5  bilateral deltoid, biceps,  triceps grip as well as hip flexors, knee extensors, ankle dorsiflexors.  His neck range of motion is normal.  His back range of motion has 75  percent forward flexion and extension as well as lateral bending.  Lateral bending causes most pain out of all these maneuvers.  His  sensation is normal bilateral upper and lower extremities.  His hip  range of motion is normal; however, he has pain with internal and  external rotation of his hips.  He has crepitus bilateral knees.  He has  full extension but lacks about 20 degrees of flexion.  Ankle range of  motion is good.  He has mild osteoarthritic changes in the MCP's and  PIP's but not the DIP's.  No other joint deformities.  Deep tendon  reflexes are 1+ bilateral biceps, triceps, brachial radialis in knee and  ankle.  Extremities show no signs of intrinsic atrophy or articulation.   IMAGING STUDIES:  Review of imaging studies demonstrates acute arthrosis  with transitional segment S1, S2, rudimentary S1, S2 disc.  This is MRI  from July of 2007 as well as CT myelogram October of 2007.  He has facet  arthropathy L4/5 and L4/S1.  He has severe disc degeneration L5/S1 with  Modic type endplate changes in the  inferior aspect of the L5 vertebral  body and superior aspect of S1.  There is some foraminal stenosis at  L4/5 and L5/S1.  No significant central stenosis.   IMPRESSION:  1. Lumbar, primarily axial pain with severe degenerative disc disease      L5/S1.  He does have facet changes at L5/S1 and he may have some      pain related to pseudoarthrosis S1/S2 from his partially lumbarized      S1 segment.  2. Osteoarthritis.  Probable osteoarthritis bilateral knees, hips and      shoulders.  Will check knee and hip films to further assess      radiologic degree.   PLAN:  Patient with goal of coming off of Oxycodone.  He is taking it  b.i.d.  Have recommended q h.s. dosing for the next few days.  I will  start  Ultram 50 mg 2 p.o. t.i.d. and Celebrex 200 b.i.d.  I have asked  him to get his last blood work from Dr. Cato Mulligan office faxed over to see  what his BUN and creatinine are and repeat BMET in one month if we  decide to keep him on it.  Overall, I would not plan to keep him on  Celebrex long term but keep him on it as we are taking him off the  Oxycodone and initiating therapies.  I would like to start with some  physical therapy after we try some lumbar facet medial branch blocks.   I will see him back in 2 weeks for the injections.      Erick Colace, M.D.  Electronically Signed     AEK/MedQ  D:03/07/2007 17:05:40  T:03/08/2007 07:13:56  Job #:  [id number]   cc:   Henry A. Pool, M.D.  Fax: [phone]   Valetta Mole. Swords, MD  [address] [place]  [place] [address]

## 2011-03-17 NOTE — Assessment & Plan Note (Signed)
A 65 year old male with severe degenerative disc disease L5-S1.  He has  undergone of prior L4-5 laminectomy and foraminotomy  in 2007.  He has  no radicular discomfort.  His flare-up of back pain is mainly activity  related, but has been fairly well controlled recently,  his main  complaint is right wrist pain.  He shoots pool, but has not been doing  much else this winter other than using a wii-fit.   His Oswestry disability index score today is 14%.   PHYSICAL EXAM:  GENERAL:  Well-developed, well-nourished male in no  acute stress.  Orientation x3.  Affect is alert.  Gait is normal.  He  has normal grip strength, but does have squaring off of his carpal  metacarpal joints on each side.  He has negative Tinel's, Phalen's,  negative Finkelstein.  Biceps, triceps strength is normal, deltoid  normal, lower extremity strength is normal, gait is normal.   IMPRESSION:  1. Post laminectomy syndrome.  2. Lumbar degenerative disc disease.  3. Right greater than left carpal-metacarpal osteoarthritis.   PLAN:  1. Continue tramadol 2 p.o. b.i.d.  2. Continue Celebrex 100 mg per day.  3. Given progression of his carpal-metacarpal pain, we will inject      today.  4. I will see him back in about 4 months, consider reinjection at that      time if the injection today was helpful.      Erick Colace, M.D.  Electronically Signed     AEK/MedQ  D:  11/27/2008 13:36:12  T:  11/28/2008 04:41:37  Job #:  [id number]

## 2011-03-17 NOTE — Assessment & Plan Note (Signed)
A 65 year old male with severe degenerative joint disease L5-S1 partial  with status post right L4-5 decompression laminectomy, foraminotomy  and  microdiskectomy in 2007, has a pseudoarthrosis with lumbarization of S1.   He has done well from a functional standpoint.  He is still doing his  own yard work.  He does have flare ups of pain when he is doing his yard  work.  He uses Celebrex at that time.  He did notice a flare up of pain  after coming off the Tramadol.  We restarted him in February and has  been doing okay since that time.   PHYSICAL EXAMINATION:  GENERAL:  No acute distress.  Mood and affect  appropriate.  The pain level is in the 4-6 out of 10 range, improving  with rest.  FUNCTIONAL STATUS:  He climbs steps, drives, independent with ADLs.  VITAL SIGNS:  Blood pressure 108/60, pulse 63, respirations 20, O2 sat  98% on room air.  MENTAL STATUS:  Oriented x3.  MUSCULOSKELETAL:  Gait is normal.  He has no tenderness over the PSIS or  in the lumbar paraspinal muscles.  His lumbar range of motion is 75%  forward flexion and 50% extension.  He has full lower extremity  strength, normal reflexes, normal hip, knee, and ankle range of motion.   IMPRESSION:  1. Lumbar post laminectomy syndrome.  2. Lumbar degenerative disk.   PLAN:  1. We will continue current medications,      a.     Tramadol 50 mg two p.o. b.i.d.      b.     Celebrex 100 mg p.o. daily.  He is actually taking this more       on a p.r.n. basis depending on his activity level.  2. I will see him back in 3 months.  No need for injections at this      time.  3. The patient has been using the WiiFit and doing some aerobic      activity with that and starting some stretches with the yoga      program.      Erick Colace, M.D.  Electronically Signed     AEK/MedQ  D:  02/02/2008 11:17:42  T:  02/02/2008 11:37:06  Job #:  [id number]   cc:   Valetta Mole. Swords, MD  [address] [place]  [place] [address]

## 2011-03-17 NOTE — Assessment & Plan Note (Signed)
A 65 year old male with severe degenerative disc disease, L5-S1, status  post right L4-5, decompression laminectomy, foraminotomy, and micro  discectomy in 2007.  He has done well from the functional standpoint  doing his own yard work.  He does have flairs of pain when he dose his  yard work.  He takes Celebrex 100 mg 5 times per week.  He has had some  right hand and wrist pain, noted only if he is off his Celebrex.   He has had no new medical problems in the interval timeframe.  His pain  is 8/10 without meds, but pain relief with meds is good.  Pain does not  interfere with his general activity.  He is using the Wii Fit program.  Sleep is fair.  Pain improves with rest and medication.  He can walk 2  hours at a time, he climbs steps, and he drives.  He is retired since  2003.   PHYSICAL EXAMINATION:  VITAL SIGNS:  His blood pressure is 102/57, pulse  56, respiratory 18, and O2 saturation is 99% on room air.  GENERAL:  No acute distress.  Orientation x3.  Affect is alert.  EXTREMITIES:  Gait is normal.  His grip strength is normal.  Bilateral  biceps and triceps strength is normal.  His lower extremity strength is  normal and hip flexion, knee extension, ankle dorsiflexion.  He is able  to toe walk and heel walk.  His deep tendon reflexes are normal in  bilateral upper and lower extremity.  Further examination of right upper  extremity shows no evidence of the intrinsic atrophy.  Negative  Phalen's.  Negative Tinel's.  Negative Finkelstein's.  Negative  carpometacarpal stress test.   IMPRESSION:  1. Lumbar postlaminectomy syndrome.  2. Lumber degenerative disc.  3. Right hand and wrist pain, likely has some osteoarthritis in the      right wrist, but certainly not inflammatory.   PLAN:  Continue current medications tramadol 50 mg 2 p.o. b.i.d. and  Celebrex 100 mg p.o. 2 daily, but only taking it at about 5 days a week.  We would like him to try to take it every other day if he  can.   I will see him back in about 4 months.      Erick Colace, M.D.  Electronically Signed     AEK/MedQ  D:  04/26/2008 13:56:02  T:  04/27/2008 05:27:08  Job #:  [id number]

## 2011-03-17 NOTE — Assessment & Plan Note (Signed)
Scott [name] returns today.  I saw him last approximately two months ago.  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  He has  facet arthropathy at L4-5 and L5-S1.  His pain has been mainly with  bending forward.  We have been able to wean down some on his Celebrex.  He is on 100 mg.  He takes it about five days a week.  He was taking it  only two to three times a week last visit.  His Ultram, he used to two  tablets b.i.d. and really not taking it t.i.d. anymore.  His main  complaints are right-sided low back, right shoulder, and left greater  than right hand and  wrist.  He has occasional numbness and tingling in  his fingers at night.  He did not get a carpal tunnel splint, but on the  other hand his symptoms have not progressed over the last two months.  His sleep is fair.  He can walk really without much limitation.  He is  retired but continues to do yard work around [employer], [employer], and  cuts grass.   REVIEW OF SYSTEMS:  Positive for numbness in the hands and spasms in the  back.   PHYSICAL EXAMINATION:  VITAL SIGNS:  His blood pressure is 118/57, pulse  75, respiratory rate is 18, O2 SAT 100% on room air.  GENERAL:  His mood and affect are appropriate.  His gait is normal.  He  is oriented x3.  PAIN AND REHAB EVALUATION:  His back has good range of motion in flexion  and extension.  He has only hand range pain.  His gait is normal.  He  has no evidence of toe drag or knee instability.   His motor strength is 5/5 bilateral upper extremities in his deltoids,  biceps, triceps, and grip, full hip flexion, knee extensor, and ankle  dorsiflexion.   MUSCULOSKELETAL:  He has some squaring of his carpometacarpals, left  greater than right, negative Tinel's, negative Phalen's maneuver.  Shoulder exam shows some pain with  abduction and crepitus in the left  shoulder only.  Deep tendon reflexes are normal in the bilateral upper  and lower extremities.   IMPRESSION:  1. Lumbar postlaminectomy syndrome.  2. Lumbar degenerative disk disease.  3. Lumbar facet arthropathy.  4. Left hand pain appears to be less a carpal tunnel and more so a      carpometacarpal arthritis.   Overall, he is doing quite well.  We can certainly inject the  carpometacarpal joints or the left shoulder, but he does not really  think it is bad enough to warrant that at this time.   PLAN:  1. Continue Ultram 50 mg two tablets b.i.d.  2. Continue Celebrex 100 mg.  He takes this about five days per week      mainly when he is active.   I will see him back in three months, and we will continue the current  medications, and if he has any exacerbation of either his hand and wrist  pain, shoulder or back pain, he will give [place] a call.      Erick Colace, M.D.  Electronically Signed     AEK/MedQ  D:  09/08/2007 14:12:41  T:  09/08/2007 19:59:46  Job #:  [id number]   cc:   Valetta Mole. Swords, MD  [address] [place]  [place] [address]

## 2011-03-17 NOTE — Assessment & Plan Note (Signed)
A 65 year old male who I last saw on June 16, 2007 for severe  degenerative disc disease in L5/S1, partially lumbarized at S1 segment,  pseudoarthrosis at S1, S2.  He has been off oxycodone for a few months  now and maintained on Ultram 50 mg 2 p.o. t.i.d..  We have also reduced  his Celebrex from 200 b.i.d. to 200 daily.   His past history is the right L4/5 decompressive laminotomy,  foraminotomy, and microdiskectomy in June 2007 for right L4/L5 stenosis.  CT myelogram in October 2007 showing facet arthropathy L4/L5, L5/S1,  severe disc degeneration L5/S1, motic type changes.  There is pain  mainly when he bends forwards and mows his lawn.  He also uses his  riding mower.  He has had some pain and numbness in his hands at night  particularly on the left side and he was planning to get a wrist splint  to help with that.  He walks 45 minutes at a time, he climbs steps, he  drives.  His pain is 2 out of 10 currently after it has been 3.   FUNCTIONAL REVIEW OF SYSTEMS:  Independent with all activity including  yard work.   PHYSICAL EXAMINATION:  VITAL SIGNS:  Blood pressure 120/71, pulse 68,  respiratory rate 18, 02 sat 98% on room air.  GENERAL:  No acute distress.  Mood and affect appropriate.  NEURO:  His gait is normal.  He is able to toe-walk-heel-walk.  His  upper extremity strength is good.  No evidence of instrinsic atrophy in  the upper extremities.  BACK:  He has good range of motion, flexion and extension and has only  mild tenderness to palpation along both sacral junction.  EXTREMITIES:  Lower extremity strength is normal. Deep tendon reflexes  are normal.  Hip, knee and ankle range of motion is normal.   IMPRESSION:  1. Lumbar post laminectomy syndrome.  2. Lumbar degenerative disc disease.  3. Lumbar facet arthropathy.  4. Left hand paraesthesia, possible carpal tunnel.  He will try a      wrist splint at night and when I see him back in 2 months we will      follow  up on this and if this has progressed I will do an EMG on      him.   I will prescribe the following meds today:  1. Ultram 50 mg # 150, take 2 tablets b.i.d. or t.i.d.  2. Celebrex 100 mg # 30, 1 tablet per day as needed and he basically      uses this 2-3 days per week only when he is doing yard work.      Erick Colace, M.D.  Electronically Signed     AEK/MedQ  D:  07/14/2007 13:10:45  T:  07/15/2007 07:56:47  Job #:  [id number]   cc:   Valetta Mole. Swords, MD  [address] [place]  [place] [address]

## 2011-03-17 NOTE — Assessment & Plan Note (Signed)
A 65 year old male who I saw last 04/21/07.  Severe degenerative disk  disease L5-S1, partially lumbar at the S1 segment, pseudoarthrosis S1,  S2.  Good results with Celebrex.  He has had borderline high creatinine  of 1.5 in May.  He has been doing quite well and his main pain complaint  is after he gets off his lawn tractor.  Nevertheless, he has been able  to do gardening, as well as walking like he used to.   MEDICATIONS:  1. Tramadol 100 mg t.i.d.  2. No longer taking oxycodone.  3. Celebrex 200 mg per day.   REVIEW OF SYSTEMS:  Negative for stomach discomfort.  Negative for  urinary problems or problems with itching.   EXAMINATION:  MUSCULOSKELETAL:  He is able to walk normally.  Spine  range of motion is normal.  He has some stiffness when bending forward.  His lower extremity strength is good.  He has positive impingement sign,  left shoulder and complains of this when he does some overhead work.  VITAL SIGNS:  Blood pressure 11/56, pulse 80, respiratory rate 20, O2  sat 98% on room air.  GENERAL:  Lumbar degenerative disk L5-S1.  Post laminectomy syndrome L4-  5.   He is doing so well that I think we could get rid of his Celebrex.  Certainly we would not need to monitor his BMET as much.  We will  continue his tramadol 100 mg t.i.d.   He will call back if he has an exacerbation of his pain and at that  point we can try reducing the Celebrex dose to 100 mg a day or just  using it more on a as needed basis on the days that he is planning to do  yard work.  Failing this, we may consider some spinal injections.  This  was all discussed with the patient.  He agrees with the plan.      Erick Colace, M.D.  Electronically Signed     AEK/MedQ  D:  06/16/2007 14:09:30  T:  06/17/2007 09:54:53  Job #:  [id number]   cc:   Valetta Mole. Swords, MD  [address] [place]  [place] [address]

## 2011-03-18 ENCOUNTER — Other Ambulatory Visit: Payer: Self-pay | Admitting: Internal Medicine

## 2011-03-19 MED ORDER — TRAMADOL HCL 50 MG PO TABS: 100.0000 mg | ORAL_TABLET | Freq: Two times a day (BID) | ORAL | Status: DC

## 2011-03-20 NOTE — Op Note (Signed)
NAME:  Scott [name], Scott [name]               ACCOUNT NO.:  [id number]   MEDICAL RECORD NO.:  [id number]          PATIENT TYPE:  OIB   LOCATION:  3011                         FACILITY:  [facility]   PHYSICIAN:  Kathaleen Maser. Pool, M.D.    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   POSTOPERATIVE DIAGNOSIS:  Right L4-5 stenosis/herniated nucleus pulposus  with radiculopathy.   PROCEDURE:  Right L4-5 decompressive laminotomy and foraminotomy,  microdiskectomy.   SURGEON:  Kathaleen Maser. Pool, M.D.   ASSISTANT:  Reinaldo Meeker, M.D.   ANESTHESIA:  General orotracheal.   INDICATIONS:  Scott [name] is a 65 year old male with history of back and  right lower extremity pain.  He recently underwent a workup which  demonstrates evidence of significant spondylosis and stenosis at the right-  sided L4-5 level.  It should be noted that the patient has a transitional  anatomy and has a rudimentary L5-S1 disk space, and we are calling the disk  space above this L4-5.  The patient has failed conservative management.  He  has decided to proceed with a right-sided L4-5 decompressive laminotomy and  foraminotomy in hopes of improving symptoms.   OPERATIVE NOTE:  The patient was placed on the operating table in supine  position.  After an adequate level of anesthesia was achieved, the patient  was positioned prone onto a Wilson frame and appropriately padded.  The  patient's lumbar region was prepped and draped sterilely.  A 10 blade was  used to make a linear skin incision overlying the L4-5 interspace.  This was  carried down sharply in the midline.  Subperiosteal dissection performed,  exposing the laminae and facet joints at L4 and L5 on the right side.  A  deep self-retaining retractor was placed, intraoperative x-ray was taken and  the level was confirmed.  A laminotomy was then performed using a high-speed  drill and Kerrison rongeurs to remove the inferior aspect of the lamina of  L4, the medial aspect of the L4-5 facet joint and the superior rim of L5  lamina.  Ligament flavum was then elevated and resected in piecemeal fashion  using Kerrison rongeurs.  The underlying thecal sac and exiting L5 nerve  root were identified.  The microscope was brought into the field and used  for microdissection of the right-sided L4 and L5 nerve roots.  Epidural  venous plexus coagulated and cut.  The thecal sac and L5 nerve root were  mobilized and retracted toward the midline.  The disk space was found to be  herniated as well as significantly spondylitic.  The disk space was then  incised with a 15 blade in rectangular fashion.  A wide disk space clean-out  was achieved using pituitary rongeurs, upward-angled pituitary rongeurs and  Epstein curettes.  Osteophyte removers were also used to remove a bridging  osteophyte from L4 to L5.  Foraminotomy was then performed along the course  of the  exiting L4 nerve root as well.  At this point a very thorough  decompression had been achieved.  There was no evidence of injury to thecal  sac or nerve roots.  The wound was then irrigated with antibiotic solution.  Gelfoam was then placed topically for hemostasis, which was found to be  good.  The microscope and retractor system were removed.  Hemostasis in the  muscle was achieved  with electrocautery.  The wound was closed in layers with Vicryl sutures.  Steri-Strips and a sterile dressing were applied.  There were no apparent  complications.  The patient tolerated the procedure well and he returns to  the recovery room postoperatively.           ______________________________  Kathaleen Maser Pool, M.D.     HAP/MEDQ  D:  04/02/2006  T:  04/02/2006  Job:  [id number]

## 2011-03-20 NOTE — Assessment & Plan Note (Signed)
A 65 year old male who I saw in initial consultation Mar 07, 2007.  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  He  has osteoarthritis in bilateral knees, hips and shoulders.   INTERVAL HISTORY:  He has had x-rays of his hips and bilateral knees.  He has mainly patellofemoral osteoarthritis and only mild DJD in the  hips.   He has been taken off oxycodone per his own request.  I started him on  Ultram 100 mg t.i.d. and Celebrex 200 b.i.d.  He is doing very well on  this combination and, in fact, has started walking again, has started  gardening again and is overall very happy with this.   I have reviewed labs from Dr. Cato Mulligan' office.   The patient has had a BMET in January of 2008 which was slightly above  the upper limit of normal 1.6 with upper limit of normal of 1.5.  BUN  was normal at 15.  Liver functions were normal.   EXAMINATION:  GENERAL:  No acute distress.  Mood and affect appropriate.  BACK:  Has no tenderness to palpation of the lumbar paraspinals.  He has  pain with extension more than with flexion.  LOWER EXTREMITIES:  Crepitus at the knees, otherwise normal.  GAIT:  Normal.   IMPRESSION:  1. Lumbar degenerative disk as well as lumbar facet arthropathy,      improved on Celebrex and Ultram combination.  2. Osteoarthritis patellofemoral primarily greater than hips, once      again improved on current medications.   PLAN:  1. Will check a BMET today.  2. If his creatinine is higher than 1.6, will need to stop Celebrex      and have him follow up with Dr. Cato Mulligan who will see him on June      11th.  More blood work is scheduled at that time.  If his      creatinine is stable or lower, will just monitor.  I have asked him      to try to reduce his Celebrex to once a day and he will try this      and see whether his pain relief is adequate at that level.  3. Hold off on  medial branch blocks given his good response to      medications.  Should he not be able to continue the Celebrex, will      likely need to reschedule these.  I will see him back in 1 month.      Erick Colace, M.D.  Electronically Signed     [facility]  D:  03/21/2007 14:18:26  T:  03/21/2007 15:38:51  Job #:  [id number]   cc:   Valetta Mole. Swords, MD  [address] [place]  [place] [address]   Sherilyn Cooter A. Pool, M.D.  Fax: [phone]

## 2011-04-06 ENCOUNTER — Ambulatory Visit: Payer: Medicare Other | Admitting: Internal Medicine

## 2011-04-29 ENCOUNTER — Ambulatory Visit (INDEPENDENT_AMBULATORY_CARE_PROVIDER_SITE_OTHER): Payer: Medicare Other | Admitting: Internal Medicine

## 2011-04-29 ENCOUNTER — Encounter: Payer: Self-pay | Admitting: Internal Medicine

## 2011-04-29 DIAGNOSIS — D649 Anemia, unspecified: Secondary | ICD-10-CM

## 2011-04-29 DIAGNOSIS — E785 Hyperlipidemia, unspecified: Secondary | ICD-10-CM

## 2011-04-29 DIAGNOSIS — R972 Elevated prostate specific antigen [PSA]: Secondary | ICD-10-CM

## 2011-04-29 DIAGNOSIS — I10 Essential (primary) hypertension: Secondary | ICD-10-CM

## 2011-04-29 DIAGNOSIS — M109 Gout, unspecified: Secondary | ICD-10-CM

## 2011-04-29 LAB — CBC WITH DIFFERENTIAL/PLATELET
Basophils Relative: 0.5 % (ref 0.0–3.0)
Hemoglobin: 12.8 g/dL — ABNORMAL LOW (ref 13.0–17.0)
Lymphocytes Relative: 32.5 % (ref 12.0–46.0)
Monocytes Relative: 8.2 % (ref 3.0–12.0)
Neutro Abs: 2 10*3/uL (ref 1.4–7.7)
RBC: 4.53 Mil/uL (ref 4.22–5.81)
WBC: 3.5 10*3/uL — ABNORMAL LOW (ref 4.5–10.5)

## 2011-04-29 NOTE — Assessment & Plan Note (Signed)
Well controlled.  Check labs  

## 2011-04-29 NOTE — Progress Notes (Signed)
  Subjective:    Patient ID: Scott [name], male    DOB: 01/03/1946, 65 y.o.   MRN: [id number]  HPI  Patient Active Problem List  Diagnoses  . B12 DEFICIENCY--monthly b12 shots  . HYPERLIPIDEMIA---not on any meds  . GOUT, UNSPECIFIED---no sxs  . ANEMIA, OTHER UNSPEC---will check today  . HYPERTENSION---tolerating meds  . GERD---no sxs on PPI  . RENAL INSUFFICIENCY---needs f/u   Past Medical History  Diagnosis Date  . B12 DEFICIENCY 07/07/2007  . HYPERLIPIDEMIA 07/13/2007  . Gout, unspecified 06/17/2010  . ANEMIA, OTHER UNSPEC 08/02/2009  . HYPERTENSION 07/13/2007  . GERD 07/13/2007  . RENAL INSUFFICIENCY 06/04/2010   Past Surgical History  Procedure Date  . Back surgery 07/13/07    reports that he has never smoked. He does not have any smokeless tobacco history on file. He reports that he does not drink alcohol or use illicit drugs. family history is not on file. Allergies  Allergen Reactions  . Nsaids     REACTION: hx of renal insufficiency     Review of Systems  patient denies chest pain, shortness of breath, orthopnea. Denies lower extremity edema, abdominal pain, change in appetite, change in bowel movements. Patient denies rashes, musculoskeletal complaints. No other specific complaints in a complete review of systems.      Objective:   Physical Exam  well-developed well-nourished male in no acute distress. HEENT exam atraumatic, normocephalic, neck supple without jugular venous distention. Chest clear to auscultation cardiac exam S1-S2 are regular. Abdominal exam overweight with bowel sounds, soft and nontender. Extremities no edema. Neurologic exam is alert with a normal gait.        Assessment & Plan:

## 2011-04-29 NOTE — Assessment & Plan Note (Signed)
No recurrence. 

## 2011-04-29 NOTE — Assessment & Plan Note (Signed)
He has tried statins in the past---unable to tolerate

## 2011-04-29 NOTE — Assessment & Plan Note (Signed)
Check labs today.

## 2011-05-08 ENCOUNTER — Ambulatory Visit: Payer: Medicare Other | Admitting: Internal Medicine

## 2011-05-11 ENCOUNTER — Other Ambulatory Visit: Payer: Self-pay | Admitting: Internal Medicine

## 2011-06-05 ENCOUNTER — Ambulatory Visit (INDEPENDENT_AMBULATORY_CARE_PROVIDER_SITE_OTHER): Payer: Medicare Other | Admitting: *Deleted

## 2011-06-05 DIAGNOSIS — D649 Anemia, unspecified: Secondary | ICD-10-CM

## 2011-06-09 ENCOUNTER — Other Ambulatory Visit: Payer: Self-pay | Admitting: *Deleted

## 2011-06-09 MED ORDER — LOSARTAN POTASSIUM 50 MG PO TABS: 50.0000 mg | ORAL_TABLET | Freq: Every day | ORAL | Status: DC

## 2011-06-16 ENCOUNTER — Other Ambulatory Visit: Payer: Self-pay | Admitting: *Deleted

## 2011-06-16 MED ORDER — TRAMADOL HCL 50 MG PO TABS: 100.0000 mg | ORAL_TABLET | Freq: Two times a day (BID) | ORAL | Status: DC

## 2011-07-08 ENCOUNTER — Ambulatory Visit: Payer: Medicare Other | Admitting: Internal Medicine

## 2011-07-09 ENCOUNTER — Ambulatory Visit (INDEPENDENT_AMBULATORY_CARE_PROVIDER_SITE_OTHER): Payer: Medicare Other | Admitting: Internal Medicine

## 2011-07-09 ENCOUNTER — Ambulatory Visit: Payer: Medicare Other | Admitting: Internal Medicine

## 2011-07-09 DIAGNOSIS — D649 Anemia, unspecified: Secondary | ICD-10-CM

## 2011-08-06 ENCOUNTER — Ambulatory Visit: Payer: Medicare Other | Admitting: Internal Medicine

## 2011-08-06 ENCOUNTER — Ambulatory Visit (INDEPENDENT_AMBULATORY_CARE_PROVIDER_SITE_OTHER): Payer: Medicare Other | Admitting: *Deleted

## 2011-08-06 DIAGNOSIS — D649 Anemia, unspecified: Secondary | ICD-10-CM

## 2011-09-08 ENCOUNTER — Ambulatory Visit (INDEPENDENT_AMBULATORY_CARE_PROVIDER_SITE_OTHER): Payer: Medicare Other | Admitting: *Deleted

## 2011-09-08 DIAGNOSIS — D649 Anemia, unspecified: Secondary | ICD-10-CM

## 2011-09-12 ENCOUNTER — Other Ambulatory Visit: Payer: Self-pay | Admitting: Internal Medicine

## 2011-09-16 ENCOUNTER — Other Ambulatory Visit: Payer: Self-pay | Admitting: *Deleted

## 2011-09-16 MED ORDER — TRAMADOL HCL 50 MG PO TABS: 100.0000 mg | ORAL_TABLET | Freq: Two times a day (BID) | ORAL | Status: DC

## 2011-10-06 ENCOUNTER — Ambulatory Visit: Payer: Medicare Other | Admitting: Internal Medicine

## 2011-10-08 ENCOUNTER — Ambulatory Visit (INDEPENDENT_AMBULATORY_CARE_PROVIDER_SITE_OTHER): Payer: Medicare Other | Admitting: Internal Medicine

## 2011-10-08 DIAGNOSIS — D649 Anemia, unspecified: Secondary | ICD-10-CM

## 2011-11-09 ENCOUNTER — Ambulatory Visit (INDEPENDENT_AMBULATORY_CARE_PROVIDER_SITE_OTHER): Payer: Medicare Other | Admitting: Internal Medicine

## 2011-11-09 DIAGNOSIS — D649 Anemia, unspecified: Secondary | ICD-10-CM

## 2011-12-08 ENCOUNTER — Ambulatory Visit (INDEPENDENT_AMBULATORY_CARE_PROVIDER_SITE_OTHER): Payer: Medicare Other | Admitting: Internal Medicine

## 2011-12-08 DIAGNOSIS — E538 Deficiency of other specified B group vitamins: Secondary | ICD-10-CM

## 2011-12-08 DIAGNOSIS — D649 Anemia, unspecified: Secondary | ICD-10-CM

## 2011-12-08 MED ORDER — CYANOCOBALAMIN 1000 MCG/ML IJ SOLN
1000.0000 ug | INTRAMUSCULAR | Status: DC
  Administered 2011-12-08 – 2012-05-06 (×6): 1000 ug via INTRAMUSCULAR

## 2011-12-15 ENCOUNTER — Other Ambulatory Visit: Payer: Self-pay | Admitting: Internal Medicine

## 2012-01-05 ENCOUNTER — Ambulatory Visit: Payer: Medicare Other | Admitting: Internal Medicine

## 2012-01-05 ENCOUNTER — Ambulatory Visit (INDEPENDENT_AMBULATORY_CARE_PROVIDER_SITE_OTHER): Payer: Medicare Other | Admitting: Internal Medicine

## 2012-01-05 DIAGNOSIS — D649 Anemia, unspecified: Secondary | ICD-10-CM

## 2012-01-05 DIAGNOSIS — E538 Deficiency of other specified B group vitamins: Secondary | ICD-10-CM

## 2012-01-11 ENCOUNTER — Other Ambulatory Visit: Payer: Self-pay | Admitting: Internal Medicine

## 2012-02-04 ENCOUNTER — Ambulatory Visit (INDEPENDENT_AMBULATORY_CARE_PROVIDER_SITE_OTHER): Payer: Medicare Other | Admitting: Internal Medicine

## 2012-02-04 DIAGNOSIS — D649 Anemia, unspecified: Secondary | ICD-10-CM

## 2012-02-04 DIAGNOSIS — E538 Deficiency of other specified B group vitamins: Secondary | ICD-10-CM

## 2012-02-11 ENCOUNTER — Other Ambulatory Visit: Payer: Self-pay | Admitting: Internal Medicine

## 2012-02-18 ENCOUNTER — Other Ambulatory Visit: Payer: Self-pay | Admitting: Internal Medicine

## 2012-03-07 ENCOUNTER — Ambulatory Visit (INDEPENDENT_AMBULATORY_CARE_PROVIDER_SITE_OTHER): Payer: Medicare Other | Admitting: Internal Medicine

## 2012-03-07 DIAGNOSIS — D649 Anemia, unspecified: Secondary | ICD-10-CM

## 2012-03-07 DIAGNOSIS — E538 Deficiency of other specified B group vitamins: Secondary | ICD-10-CM

## 2012-03-14 ENCOUNTER — Other Ambulatory Visit: Payer: Self-pay | Admitting: Internal Medicine

## 2012-04-05 ENCOUNTER — Ambulatory Visit (INDEPENDENT_AMBULATORY_CARE_PROVIDER_SITE_OTHER): Payer: Medicare Other | Admitting: Internal Medicine

## 2012-04-05 DIAGNOSIS — E538 Deficiency of other specified B group vitamins: Secondary | ICD-10-CM

## 2012-04-05 DIAGNOSIS — D649 Anemia, unspecified: Secondary | ICD-10-CM

## 2012-04-12 ENCOUNTER — Other Ambulatory Visit: Payer: Self-pay | Admitting: Internal Medicine

## 2012-04-14 ENCOUNTER — Other Ambulatory Visit: Payer: Self-pay | Admitting: Internal Medicine

## 2012-04-15 ENCOUNTER — Telehealth: Payer: Self-pay | Admitting: Internal Medicine

## 2012-04-15 MED ORDER — TRAMADOL HCL 50 MG PO TABS: 50.0000 mg | ORAL_TABLET | Freq: Two times a day (BID) | ORAL | Status: DC

## 2012-04-15 NOTE — Telephone Encounter (Signed)
rx called in

## 2012-04-15 NOTE — Telephone Encounter (Signed)
Patient would like Tramadol HCL renewed.  He has an appointment next week - but needs med renewal for back pain. [facility] [address]

## 2012-04-16 ENCOUNTER — Other Ambulatory Visit: Payer: Self-pay | Admitting: Internal Medicine

## 2012-04-19 ENCOUNTER — Other Ambulatory Visit (INDEPENDENT_AMBULATORY_CARE_PROVIDER_SITE_OTHER): Payer: Medicare Other

## 2012-04-19 DIAGNOSIS — Z0000 Encounter for general adult medical examination without abnormal findings: Secondary | ICD-10-CM

## 2012-04-19 DIAGNOSIS — I10 Essential (primary) hypertension: Secondary | ICD-10-CM

## 2012-04-19 DIAGNOSIS — Z125 Encounter for screening for malignant neoplasm of prostate: Secondary | ICD-10-CM

## 2012-04-19 LAB — CBC WITH DIFFERENTIAL/PLATELET
Basophils Absolute: 0 10*3/uL (ref 0.0–0.1)
Basophils Relative: 0.2 % (ref 0.0–3.0)
Eosinophils Absolute: 0.1 10*3/uL (ref 0.0–0.7)
HCT: 39.5 % (ref 39.0–52.0)
Hemoglobin: 12.8 g/dL — ABNORMAL LOW (ref 13.0–17.0)
Lymphocytes Relative: 30.2 % (ref 12.0–46.0)
Lymphs Abs: 1.2 10*3/uL (ref 0.7–4.0)
MCHC: 32.3 g/dL (ref 30.0–36.0)
MCV: 86.5 fl (ref 78.0–100.0)
Monocytes Absolute: 0.3 10*3/uL (ref 0.1–1.0)
Neutro Abs: 2.4 10*3/uL (ref 1.4–7.7)
RBC: 4.57 Mil/uL (ref 4.22–5.81)
RDW: 16 % — ABNORMAL HIGH (ref 11.5–14.6)

## 2012-04-19 LAB — BASIC METABOLIC PANEL
BUN: 23 mg/dL (ref 6–23)
Chloride: 102 mEq/L (ref 96–112)
GFR: 61.21 mL/min (ref >60.00)
Glucose, Bld: 93 mg/dL (ref 70–99)
Potassium: 4.3 mEq/L (ref 3.5–5.1)
Sodium: 140 mEq/L (ref 135–145)

## 2012-04-19 LAB — LIPID PANEL
Cholesterol: 216 mg/dL — ABNORMAL HIGH (ref 0–200)
HDL: 40.6 mg/dL (ref >39.00)
Total CHOL/HDL Ratio: 5
VLDL: 30.4 mg/dL (ref 0.0–40.0)

## 2012-04-19 LAB — POCT URINALYSIS DIPSTICK
Ketones, UA: NEGATIVE
Leukocytes, UA: NEGATIVE
Urobilinogen, UA: 1
pH, UA: 7

## 2012-04-19 LAB — LDL CHOLESTEROL, DIRECT: Direct LDL: 150.2 mg/dL

## 2012-04-19 LAB — HEPATIC FUNCTION PANEL
ALT: 19 U/L (ref 0–53)
AST: 26 U/L (ref 0–37)
Total Protein: 7.3 g/dL (ref 6.0–8.3)

## 2012-04-22 NOTE — Progress Notes (Signed)
A1[facility] lab added-on to [facility] orders per [facility]

## 2012-04-26 ENCOUNTER — Ambulatory Visit (INDEPENDENT_AMBULATORY_CARE_PROVIDER_SITE_OTHER): Payer: Medicare Other | Admitting: Internal Medicine

## 2012-04-26 ENCOUNTER — Encounter: Payer: Self-pay | Admitting: Internal Medicine

## 2012-04-26 ENCOUNTER — Other Ambulatory Visit: Payer: Self-pay | Admitting: Internal Medicine

## 2012-04-26 VITALS — BP 120/78 | HR 64 | Temp 98.0°F | Wt 187.0 lb

## 2012-04-26 DIAGNOSIS — Z0000 Encounter for general adult medical examination without abnormal findings: Secondary | ICD-10-CM

## 2012-04-26 DIAGNOSIS — Z79899 Other long term (current) drug therapy: Secondary | ICD-10-CM

## 2012-04-26 LAB — POCT URINALYSIS DIPSTICK
Blood, UA: NEGATIVE
Nitrite, UA: NEGATIVE
Spec Grav, UA: 1.015
Urobilinogen, UA: 1

## 2012-04-26 LAB — CBC WITH DIFFERENTIAL/PLATELET
Basophils Absolute: 0 10*3/uL (ref 0.0–0.1)
Eosinophils Absolute: 0 10*3/uL (ref 0.0–0.7)
HCT: 39.5 % (ref 39.0–52.0)
Hemoglobin: 12.7 g/dL — ABNORMAL LOW (ref 13.0–17.0)
Lymphs Abs: 1.2 10*3/uL (ref 0.7–4.0)
MCHC: 32.1 g/dL (ref 30.0–36.0)
Monocytes Absolute: 0.4 10*3/uL (ref 0.1–1.0)
Neutro Abs: 2.6 10*3/uL (ref 1.4–7.7)
Platelets: 198 10*3/uL (ref 150.0–400.0)
RDW: 16.5 % — ABNORMAL HIGH (ref 11.5–14.6)

## 2012-04-26 LAB — HEPATIC FUNCTION PANEL
ALT: 22 U/L (ref 0–53)
AST: 27 U/L (ref 0–37)
Albumin: 4.1 g/dL (ref 3.5–5.2)
Total Bilirubin: 0.4 mg/dL (ref 0.3–1.2)

## 2012-04-26 LAB — BASIC METABOLIC PANEL
BUN: 18 mg/dL (ref 6–23)
Chloride: 100 mEq/L (ref 96–112)
Creatinine, Ser: 1.5 mg/dL (ref 0.4–1.5)
GFR: 62.18 mL/min (ref >60.00)
Glucose, Bld: 93 mg/dL (ref 70–99)
Potassium: 3.9 mEq/L (ref 3.5–5.1)

## 2012-04-26 LAB — LIPID PANEL
Cholesterol: 229 mg/dL — ABNORMAL HIGH (ref 0–200)
Triglycerides: 186 mg/dL — ABNORMAL HIGH (ref 0.0–149.0)

## 2012-04-26 LAB — TSH: TSH: 1.63 u[IU]/mL (ref 0.35–5.50)

## 2012-04-26 LAB — LDL CHOLESTEROL, DIRECT: Direct LDL: 144.5 mg/dL

## 2012-04-26 NOTE — Progress Notes (Signed)
Patient ID: Scott [name], male   DOB: October 15, 1946, 66 y.o.   MRN: [id number] cpx  Past Medical History  Diagnosis Date  . B12 DEFICIENCY 07/07/2007  . HYPERLIPIDEMIA 07/13/2007  . Gout, unspecified 06/17/2010  . ANEMIA, OTHER UNSPEC 08/02/2009  . HYPERTENSION 07/13/2007  . GERD 07/13/2007  . RENAL INSUFFICIENCY 06/04/2010    History   Social History  . Marital Status: Married    Spouse Name: N/A    Number of Children: N/A  . Years of Education: N/A   Occupational History  . Not on file.   Social History Main Topics  . Smoking status: Never Smoker   . Smokeless tobacco: Not on file  . Alcohol Use: No  . Drug Use: No  . Sexually Active:    Other Topics Concern  . Not on file   Social History Narrative  . No narrative on file    Past Surgical History  Procedure Date  . Back surgery 07/13/07    No family history on file.  Allergies  Allergen Reactions  . Nsaids     REACTION: hx of renal insufficiency  . Statins     myalgias    Current Outpatient Prescriptions on File Prior to Visit  Medication Sig Dispense Refill  . allopurinol (ZYLOPRIM) 300 MG tablet TAKE 1 TABLET BY MOUTH ONCE A DAY  30 tablet  12  . COLCRYS 0.6 MG tablet TAKE 1 TABLET BY MOUTH EVERY DAY  20 tablet  1  . cyanocobalamin 1000 MCG tablet Inject 100 mcg as directed every 30 (thirty) days.       Marland Kitchen losartan (COZAAR) 50 MG tablet Take 1 tablet (50 mg total) by mouth daily.  90 tablet  3  . Polyethylene Glycol 3350 POWD Take 17 g by mouth daily.        . RABEprazole (ACIPHEX) 20 MG tablet Take 20 mg by mouth daily.        . traMADol (ULTRAM) 50 MG tablet Take 1 tablet (50 mg total) by mouth 2 (two) times daily.  30 tablet  0  . triamterene-hydrochlorothiazide (MAXZIDE-25) 37.5-25 MG per tablet TAKE 1 TABLET BY MOUTH DAILY.  30 tablet  3   Current Facility-Administered Medications on File Prior to Visit  Medication Dose Route Frequency Provider Last Rate Last Dose  . cyanocobalamin ((VITAMIN B-12))  injection 1,000 mcg  1,000 mcg Intramuscular Q30 days Lindley Magnus, MD   1,000 mcg at 04/05/12 1042     patient denies chest pain, shortness of breath, orthopnea. Denies lower extremity edema, abdominal pain, change in appetite, change in bowel movements. Patient denies rashes, musculoskeletal complaints. No other specific complaints in a complete review of systems.   BP 120/78  Pulse 64  Temp 98 F (36.7 C) (Oral)  Wt 187 lb (84.823 kg)  well-developed well-nourished male in no acute distress. HEENT exam atraumatic, normocephalic, neck supple without jugular venous distention. Chest clear to auscultation cardiac exam S1-S2 are regular. Abdominal exam overweight with bowel sounds, soft and nontender. Extremities no edema. Neurologic exam is alert with a normal gait.  Well visit- health maint UTD

## 2012-05-04 ENCOUNTER — Ambulatory Visit: Payer: Medicare Other | Admitting: *Deleted

## 2012-05-06 ENCOUNTER — Ambulatory Visit (INDEPENDENT_AMBULATORY_CARE_PROVIDER_SITE_OTHER): Payer: Medicare Other | Admitting: *Deleted

## 2012-05-06 DIAGNOSIS — D649 Anemia, unspecified: Secondary | ICD-10-CM

## 2012-05-06 DIAGNOSIS — E538 Deficiency of other specified B group vitamins: Secondary | ICD-10-CM

## 2012-05-16 ENCOUNTER — Telehealth: Payer: Self-pay | Admitting: Internal Medicine

## 2012-05-16 MED ORDER — TRAMADOL HCL 50 MG PO TABS: 100.0000 mg | ORAL_TABLET | Freq: Two times a day (BID) | ORAL | Status: DC

## 2012-05-16 NOTE — Telephone Encounter (Signed)
Pt states that he messed up on his traMADol (ULTRAM) 50 MG tablet. Pt states that he has been taking the medication for such a long time and he took 2 pills 2 times a day. Pt states that when he picked up his last prescription he did not realize that the prescription had changed to 1 pill 2 times a day. Pt is out of medication and in a lot of pain. Pt said he cant sleep and is very uncomfortable. Please contact     [facility]

## 2012-05-16 NOTE — Telephone Encounter (Signed)
Pt has been taking 2 tabs bid, rx updated to 2 tabs bid and faxed to pharmacy. Pt aware

## 2012-05-20 ENCOUNTER — Other Ambulatory Visit: Payer: Self-pay | Admitting: Internal Medicine

## 2012-06-06 ENCOUNTER — Ambulatory Visit: Payer: Medicare Other | Admitting: Internal Medicine

## 2012-06-07 ENCOUNTER — Ambulatory Visit (INDEPENDENT_AMBULATORY_CARE_PROVIDER_SITE_OTHER): Payer: Medicare Other | Admitting: Internal Medicine

## 2012-06-07 DIAGNOSIS — E538 Deficiency of other specified B group vitamins: Secondary | ICD-10-CM

## 2012-06-07 MED ORDER — CYANOCOBALAMIN 1000 MCG/ML IJ SOLN
1000.0000 ug | Freq: Once | INTRAMUSCULAR | Status: AC
  Administered 2012-06-07: 1000 ug via INTRAMUSCULAR

## 2012-06-18 ENCOUNTER — Other Ambulatory Visit: Payer: Self-pay | Admitting: Internal Medicine

## 2012-07-07 ENCOUNTER — Ambulatory Visit (INDEPENDENT_AMBULATORY_CARE_PROVIDER_SITE_OTHER): Payer: Medicare Other | Admitting: *Deleted

## 2012-07-07 DIAGNOSIS — E538 Deficiency of other specified B group vitamins: Secondary | ICD-10-CM

## 2012-07-07 MED ORDER — CYANOCOBALAMIN 1000 MCG/ML IJ SOLN
1000.0000 ug | Freq: Once | INTRAMUSCULAR | Status: AC
  Administered 2012-07-07: 1000 ug via INTRAMUSCULAR

## 2012-07-13 ENCOUNTER — Other Ambulatory Visit: Payer: Self-pay | Admitting: Internal Medicine

## 2012-07-14 ENCOUNTER — Telehealth: Payer: Self-pay | Admitting: Internal Medicine

## 2012-07-14 NOTE — Telephone Encounter (Signed)
Caller: [name]/Patient; Patient Name: Scott [name]; PCP: [facility] (Adults only); Best Callback Phone Number: [phone]; Reason for call: Other onset around 07-07-12 of occasional balance problems that lasted only a few minutes then have gone away.  07-14-12 onset around 0900 that has not gone away. he feels like balance off like he is staggering. per Neurological Deficits  911 symptom of new loss of balance or coordination causing sudden trouble walking or sitting upright occuring now or within last 2 hours identified   Advised to call 911 he said he wants wife to take him  I told him the safest way was to go by ambulance.  He was insistent so I told him to make sure they have a cell phone and if anything worsens to stop on side of road and call 911

## 2012-08-05 ENCOUNTER — Ambulatory Visit (INDEPENDENT_AMBULATORY_CARE_PROVIDER_SITE_OTHER): Payer: Medicare Other | Admitting: Internal Medicine

## 2012-08-05 DIAGNOSIS — E538 Deficiency of other specified B group vitamins: Secondary | ICD-10-CM

## 2012-08-05 MED ORDER — CYANOCOBALAMIN 1000 MCG/ML IJ SOLN
1000.0000 ug | Freq: Once | INTRAMUSCULAR | Status: AC
  Administered 2012-08-05: 1000 ug via INTRAMUSCULAR

## 2012-09-06 ENCOUNTER — Ambulatory Visit (INDEPENDENT_AMBULATORY_CARE_PROVIDER_SITE_OTHER): Payer: Medicare Other | Admitting: Internal Medicine

## 2012-09-06 DIAGNOSIS — D649 Anemia, unspecified: Secondary | ICD-10-CM

## 2012-09-06 MED ORDER — CYANOCOBALAMIN 1000 MCG/ML IJ SOLN
1000.0000 ug | Freq: Once | INTRAMUSCULAR | Status: AC
  Administered 2012-09-06: 1000 ug via INTRAMUSCULAR

## 2012-09-08 ENCOUNTER — Ambulatory Visit (INDEPENDENT_AMBULATORY_CARE_PROVIDER_SITE_OTHER): Payer: Medicare Other | Admitting: Internal Medicine

## 2012-09-08 ENCOUNTER — Encounter: Payer: Self-pay | Admitting: Internal Medicine

## 2012-09-08 VITALS — BP 130/80 | Temp 97.9°F | Wt 188.0 lb

## 2012-09-08 DIAGNOSIS — I10 Essential (primary) hypertension: Secondary | ICD-10-CM

## 2012-09-08 DIAGNOSIS — H9209 Otalgia, unspecified ear: Secondary | ICD-10-CM

## 2012-09-08 DIAGNOSIS — H9201 Otalgia, right ear: Secondary | ICD-10-CM

## 2012-09-08 NOTE — Progress Notes (Signed)
Subjective:    Patient ID: Scott [name], male    DOB: 30-Sep-1946, 66 y.o.   MRN: [id number]  HPI  66 year old patient who presents with a two-month history of right ear discomfort. [facility] and treated with amoxicillin. He felt that he may have improved briefly. No hearing loss or drainage from the ear. No fever or constitutional complaints. He does state that he chews beef  jerky on a regular basis  Past Medical History  Diagnosis Date  . B12 DEFICIENCY 07/07/2007  . HYPERLIPIDEMIA 07/13/2007  . Gout, unspecified 06/17/2010  . ANEMIA, OTHER UNSPEC 08/02/2009  . HYPERTENSION 07/13/2007  . GERD 07/13/2007  . RENAL INSUFFICIENCY 06/04/2010    History   Social History  . Marital Status: Married    Spouse Name: N/A    Number of Children: N/A  . Years of Education: N/A   Occupational History  . Not on file.   Social History Main Topics  . Smoking status: Never Smoker   . Smokeless tobacco: Not on file  . Alcohol Use: No  . Drug Use: No  . Sexually Active:    Other Topics Concern  . Not on file   Social History Narrative  . No narrative on file    Past Surgical History  Procedure Date  . Back surgery 07/13/07    No family history on file.  Allergies  Allergen Reactions  . Nsaids     REACTION: hx of renal insufficiency  . Statins     myalgias    Current Outpatient Prescriptions on File Prior to Visit  Medication Sig Dispense Refill  . allopurinol (ZYLOPRIM) 300 MG tablet TAKE 1 TABLET BY MOUTH ONCE A DAY  30 tablet  12  . COLCRYS 0.6 MG tablet TAKE 1 TABLET BY MOUTH EVERY DAY  20 tablet  1  . cyanocobalamin 1000 MCG tablet Inject 100 mcg as directed every 30 (thirty) days.       Marland Kitchen losartan (COZAAR) 50 MG tablet TAKE 1 TABLET BY MOUTH EVERY DAY  90 tablet  3  . Polyethylene Glycol 3350 POWD Take 17 g by mouth daily.        . RABEprazole (ACIPHEX) 20 MG tablet Take 20 mg by mouth daily.        . traMADol (ULTRAM) 50 MG tablet TAKE 2  TABLETS BY MOUTH TWICE A DAY  60 tablet  3  . triamterene-hydrochlorothiazide (MAXZIDE-25) 37.5-25 MG per tablet TAKE 1 TABLET BY MOUTH DAILY.  30 tablet  3    BP 130/80  Temp 97.9 F (36.6 C) (Oral)  Wt 188 lb (85.276 kg)       Review of Systems  Constitutional: Negative for fever, chills, appetite change and fatigue.  HENT: Positive for ear pain. Negative for hearing loss, congestion, sore throat, trouble swallowing, neck stiffness, dental problem, voice change and tinnitus.   Eyes: Negative for pain, discharge and visual disturbance.  Respiratory: Negative for cough, chest tightness, wheezing and stridor.   Cardiovascular: Negative for chest pain, palpitations and leg swelling.  Gastrointestinal: Negative for nausea, vomiting, abdominal pain, diarrhea, constipation, blood in stool and abdominal distention.  Genitourinary: Negative for urgency, hematuria, flank pain, discharge, difficulty urinating and genital sores.  Musculoskeletal: Negative for myalgias, back pain, joint swelling, arthralgias and gait problem.  Skin: Negative for rash.  Neurological: Negative for dizziness, syncope, speech difficulty, weakness, numbness and headaches.  Hematological: Negative for adenopathy. Does not bruise/bleed easily.  Psychiatric/Behavioral:  Negative for behavioral problems and dysphoric mood. The patient is not nervous/anxious.        Objective:   Physical Exam  Constitutional: He is oriented to person, place, and time. He appears well-developed.  HENT:  Head: Normocephalic.  Right Ear: External ear normal.  Left Ear: External ear normal.       The right canal and tympanic membrane were normal No real tenderness over the TMJ Perhaps slight tenderness over the right masseter muscle  Eyes: Conjunctivae normal and EOM are normal.  Neck: Normal range of motion.  Cardiovascular: Normal rate and normal heart sounds.   Pulmonary/Chest: Breath sounds normal.  Abdominal: Bowel sounds are  normal.  Musculoskeletal: Normal range of motion. He exhibits no edema and no tenderness.  Neurological: He is alert and oriented to person, place, and time.  Psychiatric: He has a normal mood and affect. His behavior is normal.          Assessment & Plan:   Otalgia pain appears to be more in the preauricular area probably related to excessive chewing. Will try Aleve and moderate activities. Will consider a dental referral if unimproved Hypertension. Well controlled. Repeat blood pressure 122/80

## 2012-09-08 NOTE — Patient Instructions (Signed)
Limit your sodium (Salt) intake  Please check your blood pressure on a regular basis.  If it is consistently greater than 150/90, please make an office appointment.  Call or return to clinic prn if these symptoms worsen or fail to improve as anticipated.  

## 2012-09-12 ENCOUNTER — Other Ambulatory Visit: Payer: Self-pay | Admitting: Internal Medicine

## 2012-09-18 ENCOUNTER — Other Ambulatory Visit: Payer: Self-pay | Admitting: Internal Medicine

## 2012-10-06 ENCOUNTER — Ambulatory Visit (INDEPENDENT_AMBULATORY_CARE_PROVIDER_SITE_OTHER): Payer: Medicare Other | Admitting: Internal Medicine

## 2012-10-06 DIAGNOSIS — E538 Deficiency of other specified B group vitamins: Secondary | ICD-10-CM

## 2012-10-06 MED ORDER — CYANOCOBALAMIN 1000 MCG/ML IJ SOLN
1000.0000 ug | INTRAMUSCULAR | Status: DC
  Administered 2012-10-06: 1000 ug via INTRAMUSCULAR

## 2012-11-10 ENCOUNTER — Other Ambulatory Visit: Payer: Self-pay | Admitting: Internal Medicine

## 2012-11-10 ENCOUNTER — Ambulatory Visit (INDEPENDENT_AMBULATORY_CARE_PROVIDER_SITE_OTHER): Payer: Medicare Other | Admitting: Internal Medicine

## 2012-11-10 DIAGNOSIS — D519 Vitamin B12 deficiency anemia, unspecified: Secondary | ICD-10-CM

## 2012-11-10 DIAGNOSIS — D518 Other vitamin B12 deficiency anemias: Secondary | ICD-10-CM

## 2012-11-10 MED ORDER — CYANOCOBALAMIN 1000 MCG/ML IJ SOLN
1000.0000 ug | INTRAMUSCULAR | Status: AC
  Administered 2012-11-10: 1000 ug via INTRAMUSCULAR

## 2012-12-12 ENCOUNTER — Ambulatory Visit: Payer: Medicare Other | Admitting: *Deleted

## 2012-12-29 ENCOUNTER — Ambulatory Visit (INDEPENDENT_AMBULATORY_CARE_PROVIDER_SITE_OTHER): Payer: Medicare Other | Admitting: Internal Medicine

## 2012-12-29 DIAGNOSIS — D518 Other vitamin B12 deficiency anemias: Secondary | ICD-10-CM

## 2012-12-29 MED ORDER — CYANOCOBALAMIN 1000 MCG/ML IJ SOLN
1000.0000 ug | INTRAMUSCULAR | Status: AC
  Administered 2012-12-29: 1000 ug via INTRAMUSCULAR

## 2013-01-16 ENCOUNTER — Other Ambulatory Visit: Payer: Self-pay | Admitting: Internal Medicine

## 2013-01-19 ENCOUNTER — Other Ambulatory Visit: Payer: Self-pay | Admitting: Internal Medicine

## 2013-01-27 ENCOUNTER — Ambulatory Visit (INDEPENDENT_AMBULATORY_CARE_PROVIDER_SITE_OTHER): Payer: Medicare Other

## 2013-01-27 DIAGNOSIS — E538 Deficiency of other specified B group vitamins: Secondary | ICD-10-CM

## 2013-01-27 MED ORDER — CYANOCOBALAMIN 1000 MCG/ML IJ SOLN
1000.0000 ug | Freq: Once | INTRAMUSCULAR | Status: AC
  Administered 2013-01-27: 1000 ug via INTRAMUSCULAR

## 2013-02-28 ENCOUNTER — Ambulatory Visit (INDEPENDENT_AMBULATORY_CARE_PROVIDER_SITE_OTHER): Payer: Medicare Other | Admitting: Internal Medicine

## 2013-02-28 DIAGNOSIS — E538 Deficiency of other specified B group vitamins: Secondary | ICD-10-CM

## 2013-02-28 MED ORDER — CYANOCOBALAMIN 1000 MCG/ML IJ SOLN
1000.0000 ug | Freq: Once | INTRAMUSCULAR | Status: AC
  Administered 2013-02-28: 1000 ug via INTRAMUSCULAR

## 2013-03-09 ENCOUNTER — Other Ambulatory Visit: Payer: Self-pay | Admitting: Internal Medicine

## 2013-03-24 ENCOUNTER — Other Ambulatory Visit: Payer: Self-pay | Admitting: Internal Medicine

## 2013-04-11 ENCOUNTER — Ambulatory Visit (INDEPENDENT_AMBULATORY_CARE_PROVIDER_SITE_OTHER): Payer: Medicare Other | Admitting: Internal Medicine

## 2013-04-11 DIAGNOSIS — E538 Deficiency of other specified B group vitamins: Secondary | ICD-10-CM

## 2013-04-11 MED ORDER — CYANOCOBALAMIN 1000 MCG/ML IJ SOLN
1000.0000 ug | Freq: Once | INTRAMUSCULAR | Status: AC
  Administered 2013-04-11: 1000 ug via INTRAMUSCULAR

## 2013-05-02 ENCOUNTER — Other Ambulatory Visit: Payer: Self-pay | Admitting: Internal Medicine

## 2013-05-17 ENCOUNTER — Other Ambulatory Visit: Payer: Self-pay | Admitting: Internal Medicine

## 2013-05-18 ENCOUNTER — Ambulatory Visit (INDEPENDENT_AMBULATORY_CARE_PROVIDER_SITE_OTHER): Payer: Medicare Other | Admitting: Internal Medicine

## 2013-05-18 DIAGNOSIS — E538 Deficiency of other specified B group vitamins: Secondary | ICD-10-CM

## 2013-05-18 MED ORDER — CYANOCOBALAMIN 1000 MCG/ML IJ SOLN
1000.0000 ug | Freq: Once | INTRAMUSCULAR | Status: AC
  Administered 2013-05-18: 1000 ug via INTRAMUSCULAR

## 2013-05-22 ENCOUNTER — Other Ambulatory Visit: Payer: Self-pay | Admitting: Internal Medicine

## 2013-05-25 ENCOUNTER — Other Ambulatory Visit: Payer: Self-pay | Admitting: Internal Medicine

## 2013-06-14 ENCOUNTER — Other Ambulatory Visit: Payer: Self-pay | Admitting: Internal Medicine

## 2013-06-15 ENCOUNTER — Other Ambulatory Visit: Payer: Self-pay | Admitting: *Deleted

## 2013-06-15 ENCOUNTER — Other Ambulatory Visit: Payer: Self-pay | Admitting: Internal Medicine

## 2013-06-16 ENCOUNTER — Encounter: Payer: Self-pay | Admitting: Internal Medicine

## 2013-06-16 ENCOUNTER — Ambulatory Visit (INDEPENDENT_AMBULATORY_CARE_PROVIDER_SITE_OTHER): Payer: Medicare Other | Admitting: Internal Medicine

## 2013-06-16 VITALS — BP 110/70 | HR 60 | Temp 97.7°F | Wt 183.0 lb

## 2013-06-16 DIAGNOSIS — D649 Anemia, unspecified: Secondary | ICD-10-CM

## 2013-06-16 DIAGNOSIS — Z23 Encounter for immunization: Secondary | ICD-10-CM

## 2013-06-16 DIAGNOSIS — E785 Hyperlipidemia, unspecified: Secondary | ICD-10-CM

## 2013-06-16 DIAGNOSIS — N259 Disorder resulting from impaired renal tubular function, unspecified: Secondary | ICD-10-CM

## 2013-06-16 DIAGNOSIS — M109 Gout, unspecified: Secondary | ICD-10-CM

## 2013-06-16 DIAGNOSIS — Z125 Encounter for screening for malignant neoplasm of prostate: Secondary | ICD-10-CM

## 2013-06-16 DIAGNOSIS — I10 Essential (primary) hypertension: Secondary | ICD-10-CM

## 2013-06-16 DIAGNOSIS — E538 Deficiency of other specified B group vitamins: Secondary | ICD-10-CM

## 2013-06-16 DIAGNOSIS — Z0000 Encounter for general adult medical examination without abnormal findings: Secondary | ICD-10-CM

## 2013-06-16 LAB — BASIC METABOLIC PANEL
BUN: 18 mg/dL (ref 6–23)
Creatinine, Ser: 1.4 mg/dL (ref 0.4–1.5)
GFR: 63.48 mL/min (ref >60.00)
Glucose, Bld: 93 mg/dL (ref 70–99)
Potassium: 3.9 mEq/L (ref 3.5–5.1)

## 2013-06-16 LAB — LIPID PANEL
Cholesterol: 215 mg/dL — ABNORMAL HIGH (ref 0–200)
HDL: 39.8 mg/dL (ref >39.00)
VLDL: 25.4 mg/dL (ref 0.0–40.0)

## 2013-06-16 LAB — CBC WITH DIFFERENTIAL/PLATELET
Basophils Absolute: 0 10*3/uL (ref 0.0–0.1)
Eosinophils Relative: 1.6 % (ref 0.0–5.0)
Hemoglobin: 12.8 g/dL — ABNORMAL LOW (ref 13.0–17.0)
Lymphocytes Relative: 32.4 % (ref 12.0–46.0)
Monocytes Relative: 8.8 % (ref 3.0–12.0)
Neutro Abs: 2.2 10*3/uL (ref 1.4–7.7)
Platelets: 185 10*3/uL (ref 150.0–400.0)
RDW: 16.6 % — ABNORMAL HIGH (ref 11.5–14.6)
WBC: 3.9 10*3/uL — ABNORMAL LOW (ref 4.5–10.5)

## 2013-06-16 LAB — HEPATIC FUNCTION PANEL
AST: 33 U/L (ref 0–37)
Albumin: 4.2 g/dL (ref 3.5–5.2)

## 2013-06-16 LAB — TSH: TSH: 1.94 u[IU]/mL (ref 0.35–5.50)

## 2013-06-16 MED ORDER — CYANOCOBALAMIN 1000 MCG/ML IJ SOLN
1000.0000 ug | Freq: Once | INTRAMUSCULAR | Status: AC
  Administered 2013-06-16: 1000 ug via INTRAMUSCULAR

## 2013-06-16 NOTE — Progress Notes (Signed)
Chronic back pain- uses tramadol prn and heating pad  GERD- no sxs on meds  Lipids- no meds  htn- tolerating meds  Reviewed pmh, psh, sochx, meds  Ros-  patient denies chest pain, shortness of breath, orthopnea. Denies lower extremity edema, abdominal pain, change in appetite, change in bowel movements. Patient denies rashes, musculoskeletal complaints. No other specific complaints in a complete review of systems.   Vitals- reviewed  well-developed well-nourished male in no acute distress. HEENT exam atraumatic, normocephalic, neck supple without jugular venous distention. Chest clear to auscultation cardiac exam S1-S2 are regular. Abdominal exam overweight with bowel sounds, soft and nontender. Extremities no edema. Neurologic exam is alert with a normal gait.

## 2013-06-18 NOTE — Assessment & Plan Note (Signed)
Will check labs today

## 2013-06-18 NOTE — Assessment & Plan Note (Signed)
No recurrence. 

## 2013-06-19 ENCOUNTER — Telehealth: Payer: Self-pay | Admitting: Internal Medicine

## 2013-06-19 MED ORDER — TRAMADOL HCL 50 MG PO TABS: 50.0000 mg | ORAL_TABLET | Freq: Two times a day (BID) | ORAL | Status: DC | PRN

## 2013-06-19 NOTE — Telephone Encounter (Signed)
Pt states his rx for traMADol (ULTRAM) 50 MG tablet was not at pharm.  [facility] [address]

## 2013-06-19 NOTE — Telephone Encounter (Signed)
rx called in

## 2013-06-22 ENCOUNTER — Other Ambulatory Visit: Payer: Self-pay | Admitting: *Deleted

## 2013-06-22 MED ORDER — TRAMADOL HCL 50 MG PO TABS: 100.0000 mg | ORAL_TABLET | Freq: Two times a day (BID) | ORAL | Status: DC | PRN

## 2013-06-28 ENCOUNTER — Other Ambulatory Visit: Payer: Self-pay | Admitting: Internal Medicine

## 2013-07-06 ENCOUNTER — Telehealth: Payer: Self-pay | Admitting: Internal Medicine

## 2013-07-06 MED ORDER — TRAMADOL HCL 50 MG PO TABS: 100.0000 mg | ORAL_TABLET | Freq: Two times a day (BID) | ORAL | Status: DC | PRN

## 2013-07-06 NOTE — Telephone Encounter (Signed)
[facility] 

## 2013-07-06 NOTE — Telephone Encounter (Signed)
Pt needs tramadol 50 mg 2 tablets twice a day [facility].Marland Kitchen

## 2013-07-16 ENCOUNTER — Other Ambulatory Visit: Payer: Self-pay | Admitting: Internal Medicine

## 2013-08-10 ENCOUNTER — Ambulatory Visit (INDEPENDENT_AMBULATORY_CARE_PROVIDER_SITE_OTHER): Payer: Medicare Other | Admitting: Internal Medicine

## 2013-08-10 DIAGNOSIS — E538 Deficiency of other specified B group vitamins: Secondary | ICD-10-CM

## 2013-08-10 MED ORDER — CYANOCOBALAMIN 1000 MCG/ML IJ SOLN
1000.0000 ug | Freq: Once | INTRAMUSCULAR | Status: AC
  Administered 2013-08-10: 1000 ug via INTRAMUSCULAR

## 2013-08-11 ENCOUNTER — Encounter: Payer: Medicare Other | Admitting: Internal Medicine

## 2013-09-27 ENCOUNTER — Ambulatory Visit (INDEPENDENT_AMBULATORY_CARE_PROVIDER_SITE_OTHER): Payer: Medicare Other | Admitting: Internal Medicine

## 2013-09-27 DIAGNOSIS — E538 Deficiency of other specified B group vitamins: Secondary | ICD-10-CM

## 2013-09-27 MED ORDER — CYANOCOBALAMIN 1000 MCG/ML IJ SOLN
1000.0000 ug | Freq: Once | INTRAMUSCULAR | Status: AC
  Administered 2013-09-27: 1000 ug via INTRAMUSCULAR

## 2013-10-13 ENCOUNTER — Other Ambulatory Visit: Payer: Self-pay | Admitting: Internal Medicine

## 2013-10-16 ENCOUNTER — Other Ambulatory Visit: Payer: Self-pay | Admitting: *Deleted

## 2013-10-16 MED ORDER — TRIAMTERENE-HCTZ 37.5-25 MG PO TABS: ORAL_TABLET | ORAL | Status: DC

## 2013-10-30 ENCOUNTER — Ambulatory Visit (INDEPENDENT_AMBULATORY_CARE_PROVIDER_SITE_OTHER): Payer: Medicare Other | Admitting: *Deleted

## 2013-10-30 DIAGNOSIS — E538 Deficiency of other specified B group vitamins: Secondary | ICD-10-CM

## 2013-10-30 MED ORDER — CYANOCOBALAMIN 1000 MCG/ML IJ SOLN
1000.0000 ug | Freq: Once | INTRAMUSCULAR | Status: AC
  Administered 2013-10-30: 1000 ug via INTRAMUSCULAR

## 2013-10-31 ENCOUNTER — Other Ambulatory Visit: Payer: Self-pay | Admitting: Internal Medicine

## 2013-11-14 ENCOUNTER — Encounter: Payer: Self-pay | Admitting: Family

## 2013-11-14 ENCOUNTER — Ambulatory Visit (INDEPENDENT_AMBULATORY_CARE_PROVIDER_SITE_OTHER): Payer: Medicare Other | Admitting: Family

## 2013-11-14 VITALS — BP 112/60 | HR 74 | Temp 97.9°F | Wt 183.0 lb

## 2013-11-14 DIAGNOSIS — J309 Allergic rhinitis, unspecified: Secondary | ICD-10-CM

## 2013-11-14 DIAGNOSIS — R0982 Postnasal drip: Secondary | ICD-10-CM

## 2013-11-14 MED ORDER — METHYLPREDNISOLONE 4 MG PO KIT: PACK | ORAL | Status: AC

## 2013-11-14 NOTE — Progress Notes (Signed)
Subjective:    Patient ID: Scott [name], male    DOB: November 12, 1945, 68 y.o.   MRN: [id number]  HPI 68 year old AAM, nonsmoker, is in today with c/o congestion and runny nose x 2 weeks. He has been taking Mucinex without much relief.    Review of Systems  Constitutional: Negative.   HENT: Positive for congestion and postnasal drip. Negative for sinus pressure.   Respiratory: Positive for cough. Negative for shortness of breath and wheezing.   Cardiovascular: Negative.   Gastrointestinal: Negative.   Endocrine: Negative.   Genitourinary: Negative.   Musculoskeletal: Negative.   Skin: Negative.   Allergic/Immunologic: Negative.   Neurological: Negative.    Past Medical History  Diagnosis Date  . B12 DEFICIENCY 07/07/2007  . HYPERLIPIDEMIA 07/13/2007  . Gout, unspecified 06/17/2010  . ANEMIA, OTHER UNSPEC 08/02/2009  . HYPERTENSION 07/13/2007  . GERD 07/13/2007  . RENAL INSUFFICIENCY 06/04/2010    History   Social History  . Marital Status: Married    Spouse Name: N/A    Number of Children: N/A  . Years of Education: N/A   Occupational History  . Not on file.   Social History Main Topics  . Smoking status: Never Smoker   . Smokeless tobacco: Not on file  . Alcohol Use: No  . Drug Use: No  . Sexual Activity:    Other Topics Concern  . Not on file   Social History Narrative  . No narrative on file    Past Surgical History  Procedure Laterality Date  . Back surgery  07/13/07    History reviewed. No pertinent family history.  Allergies  Allergen Reactions  . Nsaids     REACTION: hx of renal insufficiency  . Statins     myalgias    Current Outpatient Prescriptions on File Prior to Visit  Medication Sig Dispense Refill  . allopurinol (ZYLOPRIM) 300 MG tablet TAKE 1 TABLET BY MOUTH ONCE A DAY  30 tablet  12  . COLCRYS 0.6 MG tablet TAKE 1 TABLET BY MOUTH DAILY  20 tablet  1  . cyanocobalamin 1000 MCG tablet Inject 100 mcg as directed every 30 (thirty) days.        Marland Kitchen losartan (COZAAR) 50 MG tablet TAKE 1 TABLET BY MOUTH EVERY DAY  90 tablet  3  . Polyethylene Glycol 3350 POWD Take 17 g by mouth daily.        . traMADol (ULTRAM) 50 MG tablet TAKE 2 TABLETS TWICE A DAY  120 tablet  3  . triamterene-hydrochlorothiazide (MAXZIDE-25) 37.5-25 MG per tablet TAKE 1 TABLET BY MOUTH DAILY.  90 tablet  2  . RABEprazole (ACIPHEX) 20 MG tablet Take 20 mg by mouth daily.         No current facility-administered medications on file prior to visit.    BP 112/60  Pulse 74  Temp(Src) 97.9 F (36.6 C) (Oral)  Wt 183 lb (83.008 kg)chart    Objective:   Physical Exam  Constitutional: He is oriented to person, place, and time. He appears well-developed and well-nourished.  HENT:  Right Ear: External ear normal.  Left Ear: External ear normal.  Nose: Nose normal.  Mouth/Throat: Oropharynx is clear and moist.  Neck: Normal range of motion. Neck supple.  Cardiovascular: Normal rate, regular rhythm and normal heart sounds.   Pulmonary/Chest: Effort normal and breath sounds normal.  Musculoskeletal: Normal range of motion.  Neurological: He is alert and oriented to person, place, and time.  Skin: Skin  is warm and dry.  Psychiatric: He has a normal mood and affect.          Assessment & Plan:  Assessment: 1. Allergic Rhinitis  Plan: Medrol as directed. Call the office if symptoms worsen or persist. Recheck as scheduled and as needed.

## 2013-11-14 NOTE — Patient Instructions (Signed)
Allergic Rhinitis Allergic rhinitis is when the mucous membranes in the nose respond to allergens. Allergens are particles in the air that cause your body to have an allergic reaction. This causes you to release allergic antibodies. Through a chain of events, these eventually cause you to release histamine into the blood stream. Although meant to protect the body, it is this release of histamine that causes your discomfort, such as frequent sneezing, congestion, and an itchy, runny nose.  CAUSES  Seasonal allergic rhinitis (hay fever) is caused by pollen allergens that may come from grasses, trees, and weeds. Year-round allergic rhinitis (perennial allergic rhinitis) is caused by allergens such as house dust mites, pet dander, and mold spores.  SYMPTOMS   Nasal stuffiness (congestion).  Itchy, runny nose with sneezing and tearing of the eyes. DIAGNOSIS  Your health care provider can help you determine the allergen or allergens that trigger your symptoms. If you and your health care provider are unable to determine the allergen, skin or blood testing may be used. TREATMENT  Allergic Rhinitis does not have a cure, but it can be controlled by:  Medicines and allergy shots (immunotherapy).  Avoiding the allergen. Hay fever may often be treated with antihistamines in pill or nasal spray forms. Antihistamines block the effects of histamine. There are over-the-counter medicines that may help with nasal congestion and swelling around the eyes. Check with your health care provider before taking or giving this medicine.  If avoiding the allergen or the medicine prescribed do not work, there are many new medicines your health care provider can prescribe. Stronger medicine may be used if initial measures are ineffective. Desensitizing injections can be used if medicine and avoidance does not work. Desensitization is when a patient is given ongoing shots until the body becomes less sensitive to the allergen.  Make sure you follow up with your health care provider if problems continue. HOME CARE INSTRUCTIONS It is not possible to completely avoid allergens, but you can reduce your symptoms by taking steps to limit your exposure to them. It helps to know exactly what you are allergic to so that you can avoid your specific triggers. SEEK MEDICAL CARE IF:   You have a fever.  You develop a cough that does not stop easily (persistent).  You have shortness of breath.  You start wheezing.  Symptoms interfere with normal daily activities. Document Released: 07/14/2001 Document Revised: 08/09/2013 Document Reviewed: 06/26/2013 [employer] Patient Information 2014 [employer].  

## 2013-11-30 ENCOUNTER — Ambulatory Visit (INDEPENDENT_AMBULATORY_CARE_PROVIDER_SITE_OTHER): Payer: Medicare Other | Admitting: *Deleted

## 2013-11-30 DIAGNOSIS — E538 Deficiency of other specified B group vitamins: Secondary | ICD-10-CM

## 2013-11-30 MED ORDER — CYANOCOBALAMIN 1000 MCG/ML IJ SOLN
1000.0000 ug | Freq: Once | INTRAMUSCULAR | Status: AC
  Administered 2013-11-30: 1000 ug via INTRAMUSCULAR

## 2014-01-02 ENCOUNTER — Ambulatory Visit (INDEPENDENT_AMBULATORY_CARE_PROVIDER_SITE_OTHER): Payer: Medicare Other | Admitting: Internal Medicine

## 2014-01-02 DIAGNOSIS — E538 Deficiency of other specified B group vitamins: Secondary | ICD-10-CM

## 2014-01-02 MED ORDER — CYANOCOBALAMIN 1000 MCG/ML IJ SOLN
1000.0000 ug | Freq: Once | INTRAMUSCULAR | Status: AC
  Administered 2014-01-02: 1000 ug via INTRAMUSCULAR

## 2014-01-23 ENCOUNTER — Other Ambulatory Visit (INDEPENDENT_AMBULATORY_CARE_PROVIDER_SITE_OTHER): Payer: Medicare Other

## 2014-01-23 DIAGNOSIS — Z125 Encounter for screening for malignant neoplasm of prostate: Secondary | ICD-10-CM

## 2014-01-23 DIAGNOSIS — E785 Hyperlipidemia, unspecified: Secondary | ICD-10-CM

## 2014-01-23 DIAGNOSIS — Z0000 Encounter for general adult medical examination without abnormal findings: Secondary | ICD-10-CM

## 2014-01-23 LAB — CBC WITH DIFFERENTIAL/PLATELET
Basophils Absolute: 0 10*3/uL (ref 0.0–0.1)
Basophils Relative: 0.4 % (ref 0.0–3.0)
Eosinophils Absolute: 0.1 10*3/uL (ref 0.0–0.7)
Eosinophils Relative: 2.2 % (ref 0.0–5.0)
HCT: 39.4 % (ref 39.0–52.0)
Hemoglobin: 12.7 g/dL — ABNORMAL LOW (ref 13.0–17.0)
Lymphocytes Relative: 30.6 % (ref 12.0–46.0)
Lymphs Abs: 1.4 10*3/uL (ref 0.7–4.0)
MCHC: 32.3 g/dL (ref 30.0–36.0)
MCV: 86.6 fl (ref 78.0–100.0)
MONO ABS: 0.3 10*3/uL (ref 0.1–1.0)
MONOS PCT: 7.9 % (ref 3.0–12.0)
Neutro Abs: 2.6 10*3/uL (ref 1.4–7.7)
Neutrophils Relative %: 58.9 % (ref 43.0–77.0)
PLATELETS: 217 10*3/uL (ref 150.0–400.0)
RBC: 4.55 Mil/uL (ref 4.22–5.81)
RDW: 17.7 % — ABNORMAL HIGH (ref 11.5–14.6)
WBC: 4.4 10*3/uL — AB (ref 4.5–10.5)

## 2014-01-23 LAB — POCT URINALYSIS DIPSTICK
Bilirubin, UA: NEGATIVE
Glucose, UA: NEGATIVE
Ketones, UA: NEGATIVE
Nitrite, UA: NEGATIVE
PH UA: 7
SPEC GRAV UA: 1.015
Urobilinogen, UA: 1

## 2014-01-23 LAB — HEPATIC FUNCTION PANEL
ALK PHOS: 63 U/L (ref 39–117)
ALT: 23 U/L (ref 0–53)
AST: 29 U/L (ref 0–37)
Albumin: 4.3 g/dL (ref 3.5–5.2)
BILIRUBIN DIRECT: 0.2 mg/dL (ref 0.0–0.3)
TOTAL PROTEIN: 7.2 g/dL (ref 6.0–8.3)
Total Bilirubin: 0.8 mg/dL (ref 0.3–1.2)

## 2014-01-23 LAB — LIPID PANEL
Cholesterol: 236 mg/dL — ABNORMAL HIGH (ref 0–200)
HDL: 42.5 mg/dL (ref >39.00)
LDL Cholesterol: 166 mg/dL — ABNORMAL HIGH (ref 0–99)
Total CHOL/HDL Ratio: 6
Triglycerides: 137 mg/dL (ref 0.0–149.0)
VLDL: 27.4 mg/dL (ref 0.0–40.0)

## 2014-01-23 LAB — BASIC METABOLIC PANEL
BUN: 19 mg/dL (ref 6–23)
CO2: 31 mEq/L (ref 19–32)
Calcium: 9.6 mg/dL (ref 8.4–10.5)
Chloride: 98 mEq/L (ref 96–112)
Creatinine, Ser: 1.4 mg/dL (ref 0.4–1.5)
GFR: 62.36 mL/min (ref >60.00)
GLUCOSE: 93 mg/dL (ref 70–99)
POTASSIUM: 4.1 meq/L (ref 3.5–5.1)
SODIUM: 137 meq/L (ref 135–145)

## 2014-01-23 LAB — PSA: PSA: 4.11 ng/mL — ABNORMAL HIGH (ref 0.10–4.00)

## 2014-01-23 LAB — TSH: TSH: 2.72 u[IU]/mL (ref 0.35–5.50)

## 2014-01-30 ENCOUNTER — Encounter: Payer: Self-pay | Admitting: Internal Medicine

## 2014-01-30 ENCOUNTER — Ambulatory Visit (INDEPENDENT_AMBULATORY_CARE_PROVIDER_SITE_OTHER): Payer: Medicare Other | Admitting: Internal Medicine

## 2014-01-30 VITALS — BP 98/62 | HR 64 | Temp 98.0°F | Ht 70.25 in | Wt 184.0 lb

## 2014-01-30 DIAGNOSIS — Z23 Encounter for immunization: Secondary | ICD-10-CM

## 2014-01-30 DIAGNOSIS — R972 Elevated prostate specific antigen [PSA]: Secondary | ICD-10-CM

## 2014-01-30 DIAGNOSIS — Z0000 Encounter for general adult medical examination without abnormal findings: Secondary | ICD-10-CM

## 2014-01-30 DIAGNOSIS — E538 Deficiency of other specified B group vitamins: Secondary | ICD-10-CM

## 2014-01-30 MED ORDER — CIPROFLOXACIN HCL 500 MG PO TABS: 500.0000 mg | ORAL_TABLET | Freq: Two times a day (BID) | ORAL | Status: DC

## 2014-01-30 MED ORDER — CYANOCOBALAMIN 1000 MCG/ML IJ SOLN
1000.0000 ug | Freq: Once | INTRAMUSCULAR | Status: AC
  Administered 2014-01-30: 1000 ug via INTRAMUSCULAR

## 2014-01-30 NOTE — Progress Notes (Signed)
cpx  Past Medical History  Diagnosis Date  . B12 DEFICIENCY 07/07/2007  . HYPERLIPIDEMIA 07/13/2007  . Gout, unspecified 06/17/2010  . ANEMIA, OTHER UNSPEC 08/02/2009  . HYPERTENSION 07/13/2007  . GERD 07/13/2007  . RENAL INSUFFICIENCY 06/04/2010    History   Social History  . Marital Status: Married    Spouse Name: N/A    Number of Children: N/A  . Years of Education: N/A   Occupational History  . Not on file.   Social History Main Topics  . Smoking status: Never Smoker   . Smokeless tobacco: Not on file  . Alcohol Use: No  . Drug Use: No  . Sexual Activity:    Other Topics Concern  . Not on file   Social History Narrative  . No narrative on file    Past Surgical History  Procedure Laterality Date  . Back surgery  07/13/07    No family history on file.  Allergies  Allergen Reactions  . Nsaids     REACTION: hx of renal insufficiency  . Statins     myalgias    Current Outpatient Prescriptions on File Prior to Visit  Medication Sig Dispense Refill  . allopurinol (ZYLOPRIM) 300 MG tablet TAKE 1 TABLET BY MOUTH ONCE A DAY  30 tablet  12  . COLCRYS 0.6 MG tablet TAKE 1 TABLET BY MOUTH DAILY  20 tablet  1  . cyanocobalamin 1000 MCG tablet Inject 100 mcg as directed every 30 (thirty) days.       Marland Kitchen losartan (COZAAR) 50 MG tablet TAKE 1 TABLET BY MOUTH EVERY DAY  90 tablet  3  . Polyethylene Glycol 3350 POWD Take 17 g by mouth daily.        . RABEprazole (ACIPHEX) 20 MG tablet Take 20 mg by mouth daily.        . traMADol (ULTRAM) 50 MG tablet TAKE 2 TABLETS TWICE A DAY  120 tablet  3  . triamterene-hydrochlorothiazide (MAXZIDE-25) 37.5-25 MG per tablet TAKE 1 TABLET BY MOUTH DAILY.  90 tablet  2   No current facility-administered medications on file prior to visit.     patient denies chest pain, shortness of breath, orthopnea. Denies lower extremity edema, abdominal pain, change in appetite, change in bowel movements. Patient denies rashes, musculoskeletal complaints.  No other specific complaints in a complete review of systems.   BP 98/62  Pulse 64  Temp(Src) 98 F (36.7 C) (Oral)  Ht 5' 10.25" (1.784 m)  Wt 184 lb (83.462 kg)  BMI 26.22 kg/m2  well-developed well-nourished male in no acute distress. HEENT exam atraumatic, normocephalic, neck supple without jugular venous distention. Chest clear to auscultation cardiac exam S1-S2 are regular. Abdominal exam overweight with bowel sounds, soft and nontender. Extremities no edema. Neurologic exam is alert with a normal gait.  Well visit- health maint utd  Elevated psa- recheck in one month after ABX

## 2014-01-30 NOTE — Progress Notes (Signed)
Pre visit review using our clinic review tool, if applicable. No additional management support is needed unless otherwise documented below in the visit note. 

## 2014-02-01 ENCOUNTER — Ambulatory Visit: Payer: Medicare Other | Admitting: Internal Medicine

## 2014-02-28 ENCOUNTER — Other Ambulatory Visit: Payer: Self-pay | Admitting: Internal Medicine

## 2014-03-01 ENCOUNTER — Other Ambulatory Visit (INDEPENDENT_AMBULATORY_CARE_PROVIDER_SITE_OTHER): Payer: Medicare Other

## 2014-03-01 ENCOUNTER — Ambulatory Visit (INDEPENDENT_AMBULATORY_CARE_PROVIDER_SITE_OTHER): Payer: Medicare Other | Admitting: *Deleted

## 2014-03-01 DIAGNOSIS — E538 Deficiency of other specified B group vitamins: Secondary | ICD-10-CM

## 2014-03-01 DIAGNOSIS — R972 Elevated prostate specific antigen [PSA]: Secondary | ICD-10-CM

## 2014-03-01 LAB — PSA: PSA: 3.77 ng/mL (ref 0.10–4.00)

## 2014-03-01 MED ORDER — CYANOCOBALAMIN 1000 MCG/ML IJ SOLN
1000.0000 ug | Freq: Once | INTRAMUSCULAR | Status: AC
  Administered 2014-03-01: 1000 ug via INTRAMUSCULAR

## 2014-03-07 ENCOUNTER — Telehealth: Payer: Self-pay | Admitting: *Deleted

## 2014-03-07 NOTE — Telephone Encounter (Signed)
error 

## 2014-03-19 ENCOUNTER — Encounter: Payer: Self-pay | Admitting: Internal Medicine

## 2014-04-03 ENCOUNTER — Ambulatory Visit (INDEPENDENT_AMBULATORY_CARE_PROVIDER_SITE_OTHER): Payer: Medicare Other | Admitting: *Deleted

## 2014-04-03 DIAGNOSIS — E538 Deficiency of other specified B group vitamins: Secondary | ICD-10-CM

## 2014-04-03 MED ORDER — CYANOCOBALAMIN 1000 MCG/ML IJ SOLN
1000.0000 ug | Freq: Once | INTRAMUSCULAR | Status: AC
  Administered 2014-04-03: 1000 ug via INTRAMUSCULAR

## 2014-05-03 ENCOUNTER — Ambulatory Visit (INDEPENDENT_AMBULATORY_CARE_PROVIDER_SITE_OTHER): Payer: Medicare Other

## 2014-05-03 ENCOUNTER — Ambulatory Visit: Payer: Medicare Other | Admitting: *Deleted

## 2014-05-03 DIAGNOSIS — E538 Deficiency of other specified B group vitamins: Secondary | ICD-10-CM

## 2014-05-03 MED ORDER — CYANOCOBALAMIN 1000 MCG/ML IJ SOLN
1000.0000 ug | Freq: Once | INTRAMUSCULAR | Status: AC
  Administered 2014-05-03: 1000 ug via INTRAMUSCULAR

## 2014-05-19 ENCOUNTER — Other Ambulatory Visit: Payer: Self-pay | Admitting: Internal Medicine

## 2014-05-21 ENCOUNTER — Telehealth: Payer: Self-pay | Admitting: Internal Medicine

## 2014-05-21 NOTE — Telephone Encounter (Signed)
Pt would like for you to call him did not want to discuss with me

## 2014-05-22 ENCOUNTER — Telehealth: Payer: Self-pay | Admitting: Internal Medicine

## 2014-05-22 NOTE — Telephone Encounter (Signed)
Duplicate

## 2014-05-22 NOTE — Telephone Encounter (Signed)
Pt is currently on COLCRYS 0.6 MG tablet, pt states it is to expensive and he wants to know if he can go back on the meds he was on at first for gout.

## 2014-05-27 NOTE — Telephone Encounter (Signed)
He should take allopurinol If he is consistent then he should not have gout

## 2014-05-28 MED ORDER — ALLOPURINOL 300 MG PO TABS: ORAL_TABLET | ORAL | Status: DC

## 2014-05-28 NOTE — Telephone Encounter (Signed)
Pt states he takes the allopurinol everyday but he wants to know if he can he have indomethacin instead of colchicine.  He states when he eats beef if flares up so he will take the colchicine for 2 days.  He does not want to stop eating beef stew.  Please advise

## 2014-05-29 NOTE — Telephone Encounter (Signed)
Choice 1: avoid beef and anything else that triggers gout 2) take colchicine as needed (it's expensive) 3) NEVER take ibuprofen or indocin or aleve or anything that is classified as an NSAID-- his kidneys will likely be damaged if he does

## 2014-06-04 ENCOUNTER — Encounter: Payer: Self-pay | Admitting: Family Medicine

## 2014-06-04 ENCOUNTER — Telehealth: Payer: Self-pay | Admitting: Family Medicine

## 2014-06-04 ENCOUNTER — Ambulatory Visit (INDEPENDENT_AMBULATORY_CARE_PROVIDER_SITE_OTHER): Payer: Medicare Other | Admitting: *Deleted

## 2014-06-04 ENCOUNTER — Ambulatory Visit (INDEPENDENT_AMBULATORY_CARE_PROVIDER_SITE_OTHER): Payer: Medicare Other | Admitting: Family Medicine

## 2014-06-04 ENCOUNTER — Ambulatory Visit (INDEPENDENT_AMBULATORY_CARE_PROVIDER_SITE_OTHER)
Admission: RE | Admit: 2014-06-04 | Discharge: 2014-06-04 | Disposition: A | Discharge status: Home / Self Care | Payer: Medicare Other | Source: Ambulatory Visit | Attending: Family Medicine | Admitting: Family Medicine

## 2014-06-04 VITALS — BP 120/62 | HR 60 | Wt 182.0 lb

## 2014-06-04 DIAGNOSIS — M25511 Pain in right shoulder: Secondary | ICD-10-CM

## 2014-06-04 DIAGNOSIS — M7581 Other shoulder lesions, right shoulder: Secondary | ICD-10-CM

## 2014-06-04 DIAGNOSIS — M25519 Pain in unspecified shoulder: Secondary | ICD-10-CM

## 2014-06-04 DIAGNOSIS — M719 Bursopathy, unspecified: Secondary | ICD-10-CM

## 2014-06-04 DIAGNOSIS — M67919 Unspecified disorder of synovium and tendon, unspecified shoulder: Secondary | ICD-10-CM

## 2014-06-04 DIAGNOSIS — E538 Deficiency of other specified B group vitamins: Secondary | ICD-10-CM

## 2014-06-04 DIAGNOSIS — M7580 Other shoulder lesions, unspecified shoulder: Secondary | ICD-10-CM | POA: Insufficient documentation

## 2014-06-04 MED ORDER — COLCHICINE 0.6 MG PO TABS: ORAL_TABLET | ORAL | Status: DC

## 2014-06-04 MED ORDER — CYANOCOBALAMIN 1000 MCG/ML IJ SOLN
1000.0000 ug | Freq: Once | INTRAMUSCULAR | Status: AC
  Administered 2014-06-04: 1000 ug via INTRAMUSCULAR

## 2014-06-04 MED ORDER — PREDNISONE 20 MG PO TABS: 20.0000 mg | ORAL_TABLET | Freq: Every day | ORAL | Status: DC

## 2014-06-04 NOTE — Assessment & Plan Note (Signed)
R. Shoulder. X-ray consistent with rotator cuff injury/impingement of supraspinatus. History consistent with this injury as well. Patient would like to trial oral prednisone before steroid shot. Referred to PT and advised icing. Follow up 1-2 weeks after prednisone.

## 2014-06-04 NOTE — Progress Notes (Signed)
Garret Reddish, MD Phone: [phone]  Subjective:   Scott [name] is a 68 y.o. year old very pleasant male patient who presents with the following:  Shoulder Pain Holding steering wheel tightly 2-3 months ago when had to suddenly stop. Started having Mainly lateral shoulder pain some pain into med upper arm after that. Hurts at night and wakes him up from sleep if laying on it.  Pain some better for first month but stable since that time. Tried capsaicin and with some relief. Soy bean junior helped some time. No ibuprofen. Takes tramadol but it does not help the shoulder. Pain with lifting arm overhead. Pain 10/10 at night when he lays on it or up to 4/10 with lifting overhead  ROS- No paresthesias  Past Medical History- Patient Active Problem List   Diagnosis Date Noted  . GOUT, UNSPECIFIED 06/17/2010  . RENAL INSUFFICIENCY 06/04/2010  . ANEMIA, OTHER UNSPEC 08/02/2009  . HYPERLIPIDEMIA 07/13/2007  . HYPERTENSION 07/13/2007  . GERD 07/13/2007  . B12 DEFICIENCY 07/07/2007   Medications- reviewed and updated Current Outpatient Prescriptions  Medication Sig Dispense Refill  . allopurinol (ZYLOPRIM) 300 MG tablet TAKE 1 TABLET BY MOUTH ONCE A DAY  30 tablet  5  . ciprofloxacin (CIPRO) 500 MG tablet Take 1 tablet (500 mg total) by mouth 2 (two) times daily.  20 tablet  0  . cyanocobalamin 1000 MCG tablet Inject 100 mcg as directed every 30 (thirty) days.       Marland Kitchen losartan (COZAAR) 50 MG tablet TAKE 1 TABLET BY MOUTH EVERY DAY  90 tablet  3  . Polyethylene Glycol 3350 POWD Take 17 g by mouth daily.        . RABEprazole (ACIPHEX) 20 MG tablet Take 20 mg by mouth daily.        . traMADol (ULTRAM) 50 MG tablet TAKE 2 TABLETS TWICE A DAY  120 tablet  5  . triamterene-hydrochlorothiazide (MAXZIDE-25) 37.5-25 MG per tablet TAKE 1 TABLET BY MOUTH DAILY.  90 tablet  2  . colchicine (COLCRYS) 0.6 MG tablet TAKE 1 TABLET BY MOUTH DAILY  20 tablet  1   No current facility-administered  medications for this visit.    Objective: BP 120/62  Pulse 60  Wt 182 lb (82.555 kg) Gen: NAD, resting comfortably Neck: no shoulder or neck pain with turning head Right Shoulder (as compared to left): Inspection reveals no abnormalities, atrophy or asymmetry. Palpation is normal with no tenderness over AC joint or bicipital groove. ROM is full in all planes. Rotator cuff strength normal throughout with exception 4/5 strength with empty can Negative Neer test. Signs of impingement noted with Hawkin's tests, empty can. Speeds and Yergason's tests normal. Painful arc but no drop arm sign.  Neuro: intact distal sensation Ext: no edema  Dg Shoulder Right  06/04/2014   CLINICAL DATA:  Injury to the right shoulder approximately 3 months ago. Patient unable to raise the right arm.  EXAM: RIGHT SHOULDER - 2+ VIEW  COMPARISON:  None.  FINDINGS: No evidence of acute or subacute fracture or glenohumeral dislocation. Slight narrowing of the subacromial space. Acromioclavicular joint intact without significant degenerative change. On the Y-view, there is a possible calcification adjacent to the posterior humeral head, not visualized on the other images bone mineral density well-preserved.  IMPRESSION: No acute osseous abnormality. 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 Possible bursal calcification adjacent to the posterior humeral head.   Electronically Signed   By: Marcello Moores  Lawrence M.D.   On: 06/04/2014 15:00   Assessment/Plan:  Rotator cuff tendinitis R. Shoulder. X-ray consistent with rotator cuff injury/impingement of supraspinatus. History consistent with this injury as well. Patient would like to trial oral prednisone before steroid shot. Referred to PT and advised icing. Follow up 1-2 weeks after prednisone.    Orders Placed This Encounter  Procedures  . DG Shoulder Right    Standing Status: Future     Number of Occurrences: 1      Standing Expiration Date: 08/05/2015    Order Specific Question:  Reason for Exam (SYMPTOM  OR DIAGNOSIS REQUIRED)    Answer:  right shoulder pain. History injury though suspect rotator cuff tendonitis    Order Specific Question:  Preferred imaging location?    Answer:  [facility]  . Ambulatory referral to Physical Therapy    Referral Priority:  Routine    Referral Type:  Physical Medicine    Referral Reason:  Specialty Services Required    Requested Specialty:  Physical Therapy    Number of Visits Requested:  1

## 2014-06-04 NOTE — Patient Instructions (Signed)
Rotator cuff injury/tendonitis  Physical therapy is the foundation of therapy (they will call you with appointment)  Ice 20 minutes 2-3 x a day  If x-ray is ok, we will give you a course of prednisone  If prednisone does not help, will bring you in to try injection of steroid directly into the joint.   Let's check back in about a week after your prednisone course

## 2014-06-04 NOTE — Telephone Encounter (Signed)
Pt stated he was getting brand name.  I sent the colchicine in.  Pt aware of Dr Leanne Chang advice.  Nothing further is needed

## 2014-06-04 NOTE — Telephone Encounter (Signed)
X-ray shows signs of rotator cuff injury but no fracture. Will try 7 day course of prednisone. Follow up about 1-2 weeks after finishing medication and we can try injection if need be.

## 2014-06-04 NOTE — Addendum Note (Signed)
Addended by: Townsend Roger D on: 06/04/2014 01:30 PM   Modules accepted: Orders

## 2014-06-05 ENCOUNTER — Ambulatory Visit: Payer: Medicare Other | Attending: Internal Medicine | Admitting: Physical Therapy

## 2014-06-05 DIAGNOSIS — M25619 Stiffness of unspecified shoulder, not elsewhere classified: Secondary | ICD-10-CM | POA: Diagnosis not present

## 2014-06-05 DIAGNOSIS — M67919 Unspecified disorder of synovium and tendon, unspecified shoulder: Secondary | ICD-10-CM | POA: Insufficient documentation

## 2014-06-05 DIAGNOSIS — IMO0001 Reserved for inherently not codable concepts without codable children: Secondary | ICD-10-CM | POA: Diagnosis present

## 2014-06-05 DIAGNOSIS — R5381 Other malaise: Secondary | ICD-10-CM | POA: Insufficient documentation

## 2014-06-05 DIAGNOSIS — M25519 Pain in unspecified shoulder: Secondary | ICD-10-CM | POA: Insufficient documentation

## 2014-06-05 DIAGNOSIS — M719 Bursopathy, unspecified: Secondary | ICD-10-CM | POA: Insufficient documentation

## 2014-06-05 NOTE — Telephone Encounter (Signed)
Pt.notified

## 2014-06-12 ENCOUNTER — Ambulatory Visit: Payer: Medicare Other | Admitting: Rehabilitation

## 2014-06-17 ENCOUNTER — Other Ambulatory Visit: Payer: Self-pay | Admitting: Internal Medicine

## 2014-06-19 ENCOUNTER — Encounter: Payer: Medicare Other | Admitting: Rehabilitation

## 2014-06-26 ENCOUNTER — Encounter: Payer: Medicare Other | Admitting: Physical Therapy

## 2014-06-27 ENCOUNTER — Telehealth: Payer: Self-pay | Admitting: Internal Medicine

## 2014-06-27 NOTE — Telephone Encounter (Signed)
Pt request refill of the following:  colchicine (COLCRYS) 0.6 MG tablet   Pt ask if you could call this in to [facility]. [facility] and [facility] cost [facility] and he can not pay for that     Phamacy:  [facility]

## 2014-06-28 MED ORDER — COLCHICINE 0.6 MG PO TABS: ORAL_TABLET | ORAL | Status: DC

## 2014-06-28 NOTE — Telephone Encounter (Signed)
rx sent in electronically 

## 2014-07-05 ENCOUNTER — Ambulatory Visit (INDEPENDENT_AMBULATORY_CARE_PROVIDER_SITE_OTHER): Payer: Medicare Other | Admitting: Family Medicine

## 2014-07-05 ENCOUNTER — Ambulatory Visit: Payer: Medicare Other | Admitting: *Deleted

## 2014-07-05 ENCOUNTER — Encounter: Payer: Self-pay | Admitting: Family Medicine

## 2014-07-05 VITALS — BP 106/52 | HR 62 | Temp 97.9°F | Ht 70.25 in | Wt 180.0 lb

## 2014-07-05 DIAGNOSIS — M7581 Other shoulder lesions, right shoulder: Secondary | ICD-10-CM

## 2014-07-05 DIAGNOSIS — M67919 Unspecified disorder of synovium and tendon, unspecified shoulder: Secondary | ICD-10-CM

## 2014-07-05 DIAGNOSIS — M109 Gout, unspecified: Secondary | ICD-10-CM

## 2014-07-05 DIAGNOSIS — M719 Bursopathy, unspecified: Secondary | ICD-10-CM

## 2014-07-05 DIAGNOSIS — E538 Deficiency of other specified B group vitamins: Secondary | ICD-10-CM

## 2014-07-05 MED ORDER — COLCHICINE 0.6 MG PO TABS: ORAL_TABLET | ORAL | Status: DC

## 2014-07-05 MED ORDER — CYANOCOBALAMIN 1000 MCG/ML IJ SOLN
1000.0000 ug | Freq: Once | INTRAMUSCULAR | Status: AC
  Administered 2014-07-05: 1000 ug via INTRAMUSCULAR

## 2014-07-05 NOTE — Patient Instructions (Signed)
Glad your shoulder pain is better! Start exercises to make sure it does not come back.   Gout-try colchicine at different pharmacy  Physical-schedule at front within next 3 months (you canbe the 3rd 30 minute slot per half day)

## 2014-07-05 NOTE — Progress Notes (Signed)
  Garret Reddish, MD Phone: [phone]  Subjective:   Scott [name] is a 68 y.o. year old very pleasant male patient who presents with the following:  Right rotator cuff tendinitis Much improved after course of prednisone. Improved after 3-4 days of medicine. Patient states he has full range of motion on his arms now. He cannot afford physical therapy. ROS- No paresthesias  Gout Has attack every 2-3 months. Colchicine was very expensive last time he tried to pick it up. He is taking his allopurinol faithfully. ROS-no current gout flare, no fever no chills  Past Medical History- Patient Active Problem List   Diagnosis Date Noted  . Gout, unspecified 06/17/2010    Priority: Medium  . 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 HYPERLIPIDEMIA 07/13/2007    Priority: Medium  . HYPERTENSION 07/13/2007    Priority: Medium  . Rotator cuff tendinitis 06/04/2014    Priority: Low  . ANEMIA, OTHER UNSPEC 08/02/2009    Priority: Low  . GERD 07/13/2007    Priority: Low  . B12 DEFICIENCY 07/07/2007    Priority: Low   Medications- reviewed and updated Current Outpatient Prescriptions  Medication Sig Dispense Refill  . allopurinol (ZYLOPRIM) 300 MG tablet TAKE 1 TABLET BY MOUTH ONCE A DAY  30 tablet  5  . losartan (COZAAR) 50 MG tablet TAKE 1 TABLET BY MOUTH EVERY DAY  90 tablet  1  . Polyethylene Glycol 3350 POWD Take 17 g by mouth daily.        . RABEprazole (ACIPHEX) 20 MG tablet Take 20 mg by mouth daily.        . traMADol (ULTRAM) 50 MG tablet TAKE 2 TABLETS TWICE A DAY  120 tablet  5  . triamterene-hydrochlorothiazide (MAXZIDE-25) 37.5-25 MG per tablet TAKE 1 TABLET BY MOUTH DAILY.  90 tablet  2  . colchicine (COLCRYS) 0.6 MG tablet At first sign of flare, take 2 pills and if pain continues, take 1 more pill 1 hour later. Following days take 1 pill until pain resolves.  30 tablet  2  . cyanocobalamin 1000 MCG tablet Inject 100 mcg as directed every  30 (thirty) days.       . predniSONE (DELTASONE) 20 MG tablet Take 1 tablet (20 mg total) by mouth daily with breakfast. Take 2 tabs for 3 days, then 1 tab for 4 days  10 tablet  0   No current facility-administered medications for this visit.    Objective: BP 106/52  Pulse 62  Temp(Src) 97.9 F (36.6 C)  Ht 5' 10.25" (1.784 m)  Wt 180 lb (81.647 kg)  BMI 25.65 kg/m2 Gen: NAD, resting comfortably Neck: no shoulder or neck pain with turning head Bilateral shoulders ROM is full in all planes. Rotator cuff strength normal throughout. No signs of of impingement noted with negative neer, Hawkin's tests, empty can. No Painful arc.  no drop arm sign.  Neuro: intact distal sensation Ext: no edema  Assessment/Plan:  Gout, unspecified Need to obtain uric acid with next set of labs. Continue allopurinol. Refill colchicine with paper Rx so that the patient can price shop   Rotator cuff tendinitis Pain has resolved. Gave patient handout with home exercise program to prevent recurrence

## 2014-07-05 NOTE — Assessment & Plan Note (Signed)
Pain has resolved. Gave patient handout with home exercise program to prevent recurrence

## 2014-07-05 NOTE — Assessment & Plan Note (Signed)
Need to obtain uric acid with next set of labs. Continue allopurinol. Refill colchicine with paper Rx so that the patient can price shop

## 2014-07-05 NOTE — Progress Notes (Signed)
Pre visit review using our clinic review tool, if applicable. No additional management support is needed unless otherwise documented below in the visit note. 

## 2014-07-13 ENCOUNTER — Other Ambulatory Visit: Payer: Self-pay | Admitting: Internal Medicine

## 2014-08-02 ENCOUNTER — Other Ambulatory Visit: Payer: Self-pay | Admitting: Family Medicine

## 2014-08-02 ENCOUNTER — Telehealth: Payer: Self-pay | Admitting: Internal Medicine

## 2014-08-02 MED ORDER — ALLOPURINOL 300 MG PO TABS: ORAL_TABLET | ORAL | Status: DC

## 2014-08-02 NOTE — Telephone Encounter (Signed)
[id number] [place], [place] is requesting 90 day re-fill on allopurinol (ZYLOPRIM) 300 MG tablet

## 2014-08-06 ENCOUNTER — Ambulatory Visit (INDEPENDENT_AMBULATORY_CARE_PROVIDER_SITE_OTHER): Payer: Medicare Other | Admitting: *Deleted

## 2014-08-06 ENCOUNTER — Ambulatory Visit: Payer: Medicare Other | Admitting: Family Medicine

## 2014-08-06 DIAGNOSIS — E538 Deficiency of other specified B group vitamins: Secondary | ICD-10-CM

## 2014-08-06 MED ORDER — CYANOCOBALAMIN 1000 MCG/ML IJ SOLN
1000.0000 ug | Freq: Once | INTRAMUSCULAR | Status: AC
  Administered 2014-08-06: 1000 ug via INTRAMUSCULAR

## 2014-08-29 ENCOUNTER — Other Ambulatory Visit: Payer: Self-pay | Admitting: Internal Medicine

## 2014-08-31 NOTE — Telephone Encounter (Signed)
Is this ok to refill?  

## 2014-09-03 ENCOUNTER — Telehealth: Payer: Self-pay | Admitting: Family Medicine

## 2014-09-03 NOTE — Telephone Encounter (Signed)
Pt would like a refill on his Tramadol please. [facility] [place] [facility].

## 2014-09-03 NOTE — Telephone Encounter (Signed)
Is this ok to refill? Pt was last seen on 07/05/14

## 2014-09-03 NOTE — Telephone Encounter (Signed)
Medication refilled

## 2014-09-03 NOTE — Telephone Encounter (Signed)
yes

## 2014-09-05 ENCOUNTER — Ambulatory Visit (INDEPENDENT_AMBULATORY_CARE_PROVIDER_SITE_OTHER): Payer: Medicare Other | Admitting: Family Medicine

## 2014-09-05 DIAGNOSIS — E538 Deficiency of other specified B group vitamins: Secondary | ICD-10-CM

## 2014-09-05 MED ORDER — CYANOCOBALAMIN 1000 MCG/ML IJ SOLN
1000.0000 ug | Freq: Once | INTRAMUSCULAR | Status: AC
  Administered 2014-09-05: 1000 ug via INTRAMUSCULAR

## 2014-10-04 ENCOUNTER — Ambulatory Visit (INDEPENDENT_AMBULATORY_CARE_PROVIDER_SITE_OTHER): Payer: Medicare Other | Admitting: Family Medicine

## 2014-10-04 DIAGNOSIS — E538 Deficiency of other specified B group vitamins: Secondary | ICD-10-CM

## 2014-10-04 MED ORDER — CYANOCOBALAMIN 1000 MCG/ML IJ SOLN
1000.0000 ug | Freq: Once | INTRAMUSCULAR | Status: AC
  Administered 2014-10-04: 1000 ug via INTRAMUSCULAR

## 2014-10-10 ENCOUNTER — Encounter: Payer: Self-pay | Admitting: Family Medicine

## 2014-10-10 ENCOUNTER — Ambulatory Visit (INDEPENDENT_AMBULATORY_CARE_PROVIDER_SITE_OTHER): Payer: Medicare Other | Admitting: Family Medicine

## 2014-10-10 VITALS — BP 120/64 | Temp 98.0°F | Wt 179.0 lb

## 2014-10-10 DIAGNOSIS — M7581 Other shoulder lesions, right shoulder: Secondary | ICD-10-CM

## 2014-10-10 DIAGNOSIS — E785 Hyperlipidemia, unspecified: Secondary | ICD-10-CM

## 2014-10-10 DIAGNOSIS — M7582 Other shoulder lesions, left shoulder: Secondary | ICD-10-CM

## 2014-10-10 DIAGNOSIS — E538 Deficiency of other specified B group vitamins: Secondary | ICD-10-CM

## 2014-10-10 DIAGNOSIS — I10 Essential (primary) hypertension: Secondary | ICD-10-CM

## 2014-10-10 DIAGNOSIS — M1A09X0 Idiopathic chronic gout, multiple sites, without tophus (tophi): Secondary | ICD-10-CM

## 2014-10-10 LAB — CBC
HCT: 41.2 % (ref 39.0–52.0)
HEMOGLOBIN: 13.2 g/dL (ref 13.0–17.0)
MCHC: 31.9 g/dL (ref 30.0–36.0)
MCV: 86.9 fl (ref 78.0–100.0)
Platelets: 202 10*3/uL (ref 150.0–400.0)
RBC: 4.74 Mil/uL (ref 4.22–5.81)
RDW: 15.5 % (ref 11.5–15.5)
WBC: 4.5 10*3/uL (ref 4.0–10.5)

## 2014-10-10 LAB — COMPREHENSIVE METABOLIC PANEL
ALK PHOS: 75 U/L (ref 39–117)
ALT: 17 U/L (ref 0–53)
AST: 24 U/L (ref 0–37)
Albumin: 4.1 g/dL (ref 3.5–5.2)
BUN: 22 mg/dL (ref 6–23)
CALCIUM: 8.9 mg/dL (ref 8.4–10.5)
CO2: 28 mEq/L (ref 19–32)
CREATININE: 1.5 mg/dL (ref 0.4–1.5)
Chloride: 99 mEq/L (ref 96–112)
GFR: 58.91 mL/min — ABNORMAL LOW (ref >60.00)
GLUCOSE: 91 mg/dL (ref 70–99)
Potassium: 3.9 mEq/L (ref 3.5–5.1)
Sodium: 134 mEq/L — ABNORMAL LOW (ref 135–145)
Total Bilirubin: 0.4 mg/dL (ref 0.2–1.2)
Total Protein: 7.4 g/dL (ref 6.0–8.3)

## 2014-10-10 LAB — LIPID PANEL
CHOLESTEROL: 205 mg/dL — AB (ref 0–200)
HDL: 40.8 mg/dL (ref >39.00)
LDL CALC: 140 mg/dL — AB (ref 0–99)
NonHDL: 164.2
Total CHOL/HDL Ratio: 5
Triglycerides: 119 mg/dL (ref 0.0–149.0)
VLDL: 23.8 mg/dL (ref 0.0–40.0)

## 2014-10-10 LAB — URIC ACID: Uric Acid, Serum: 4.8 mg/dL (ref 4.0–7.8)

## 2014-10-10 LAB — TSH: TSH: 1.93 u[IU]/mL (ref 0.35–4.50)

## 2014-10-10 LAB — VITAMIN B12: VITAMIN B 12: 746 pg/mL (ref 211–911)

## 2014-10-10 MED ORDER — PREDNISONE 20 MG PO TABS: 20.0000 mg | ORAL_TABLET | Freq: Every day | ORAL | Status: DC

## 2014-10-10 NOTE — Patient Instructions (Signed)
There are portions of this that seem like rotator cuff issues and I think this is the most likely thing since we didn't get you exercises. I am also slightly concerned about your neck. Regardless, prednisone should help. Start your exercises 2-3 days after starting prednisone  See you back in around 10 days  Fasting labs today

## 2014-10-10 NOTE — Progress Notes (Signed)
Garret Reddish, MD Phone: [phone]  Subjective:   Scott [name] is a 68 y.o. year old very pleasant male patient who presents with the following:  Bilateral Shoulder Pain -Patient with a history of rotator cuff tendinitis in august which responded to prednisone. Patient states he did well for about 3 months but unfortunately he never received exercises for home exercise program. Patient states his pain started to recur about a month ago noticed a progressive since that time. Both arms seem to be involved at this point. He denies any neck pain. He does not have any pain with his arms are at rest but when he tries to lift his arms overhead he experiences moderate to severe pain. Pain is similar to the pain in August but bilateral. He denies any stiffness in shoulders and oriented or any stiffness or pain in any other areas. Pain is worse when he lays on shoulders.  ROS-no paresthesias in the hands, no extremity weakness. No pain with neck movement.   Past Medical History- Patient Active Problem List   Diagnosis Date Noted  . Gout 06/17/2010    Priority: Medium  . 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 Hyperlipidemia 07/13/2007    Priority: Medium  . Essential hypertension 07/13/2007    Priority: Medium  . Rotator cuff tendinitis 06/04/2014    Priority: Low  . ANEMIA, OTHER UNSPEC 08/02/2009    Priority: Low  . GERD 07/13/2007    Priority: Low  . B12 deficiency 07/07/2007    Priority: Low   Medications- reviewed and updated Current Outpatient Prescriptions  Medication Sig Dispense Refill  . allopurinol (ZYLOPRIM) 300 MG tablet TAKE 1 TABLET BY MOUTH ONCE A DAY 90 tablet 3  . colchicine (COLCRYS) 0.6 MG tablet At first sign of flare, take 2 pills and if pain continues, take 1 more pill 1 hour later. Following days take 1 pill until pain resolves. 30 tablet 2  . cyanocobalamin 1000 MCG tablet Inject 100 mcg as directed every 30 (thirty) days.      Marland Kitchen losartan (COZAAR) 50 MG tablet TAKE 1 TABLET BY MOUTH EVERY DAY 90 tablet 1  . Polyethylene Glycol 3350 POWD Take 17 g by mouth daily.      . RABEprazole (ACIPHEX) 20 MG tablet Take 20 mg by mouth daily.      Marland Kitchen triamterene-hydrochlorothiazide (MAXZIDE-25) 37.5-25 MG per tablet TAKE 1 TABLET BY MOUTH DAILY. 90 tablet 0  . predniSONE (DELTASONE) 20 MG tablet Take 1 tablet (20 mg total) by mouth daily with breakfast. Take 2 tabs for 3 days, then 1 tab for 4 days 10 tablet 0  . traMADol (ULTRAM) 50 MG tablet TAKE 2 TABLETS BY MOUTH TWICE A DAY (Patient not taking: Reported on 10/10/2014) 120 tablet 2   No current facility-administered medications for this visit.    Objective: BP 120/64 mmHg  Temp(Src) 98 F (36.7 C)  Wt 179 lb (81.194 kg) Gen: NAD, resting comfortably CV: RRR no murmurs rubs or gallops Lungs: CTAB no crackles, wheeze, rhonchi Abdomen: soft/nontender/nondistended/normal bowel sounds. No rebound or guarding.  Ext: no edema Skin: warm, dry Neuro: grossly normal, moves all extremities  Neck: no shoulder or neck pain with turning head. Negative Spurling Bilateral shoulders similar and reported below Inspection reveals no abnormalities, atrophy or asymmetry. Palpation is normal with no tenderness over AC joint or bicipital groove. Mild tenderness over lateral aspect.  ROM is full in all planes.Painful arc noted. No drop arm  sign.  Rotator cuff strength normal throughout with exception 4/5 strength with empty can Negative Neer test. Hawkin's negative this time.  Signs of impingement noted empty can. Speeds and Yergason's tests normal.   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 I discussed with patient that I was concerned about his neck as a potential source but with the pain with getting his arms overhead ultimately This does seem to be more related to his rotator cuff.  He is not interested in an injection so we will trial a course of  prednisone again. I handed him a home exercise program to be used. We will follow-up in about 10 days   Return precautions advised. We will also discuss his fasting labs at next visit  Fasting labs Orders Placed This Encounter  Procedures  . Uric Acid  . CBC    [place]  . Comprehensive metabolic panel    [place]    Order Specific Question:  Has the patient fasted?    Answer:  No  . TSH    [place]  . Lipid panel    [place]    Order Specific Question:  Has the patient fasted?    Answer:  No  . Vitamin B12    Meds ordered this encounter  Medications  . predniSONE (DELTASONE) 20 MG tablet    Sig: Take 1 tablet (20 mg total) by mouth daily with breakfast. Take 2 tabs for 3 days, then 1 tab for 4 days    Dispense:  10 tablet    Refill:  0

## 2014-10-10 NOTE — Assessment & Plan Note (Signed)
Recurrent and this time bilateral. I discussed with patient that I was concerned about his neck as a potential source but with the pain with getting his arms overhead ultimately This does seem to be more related to his rotator cuff.  He is not interested in an injection so we will trial a course of prednisone again. I handed him a home exercise program to be used. We will follow-up in about 10 days

## 2014-10-12 ENCOUNTER — Other Ambulatory Visit: Payer: Self-pay | Admitting: Family Medicine

## 2014-10-12 NOTE — Telephone Encounter (Signed)
Medication refilled

## 2014-10-22 ENCOUNTER — Encounter: Payer: Self-pay | Admitting: Family Medicine

## 2014-10-22 ENCOUNTER — Ambulatory Visit (INDEPENDENT_AMBULATORY_CARE_PROVIDER_SITE_OTHER): Payer: Medicare Other | Admitting: Family Medicine

## 2014-10-22 VITALS — BP 100/52 | HR 60 | Temp 98.0°F | Wt 183.0 lb

## 2014-10-22 DIAGNOSIS — M545 Low back pain, unspecified: Secondary | ICD-10-CM | POA: Insufficient documentation

## 2014-10-22 DIAGNOSIS — M7582 Other shoulder lesions, left shoulder: Secondary | ICD-10-CM

## 2014-10-22 DIAGNOSIS — IMO0001 Reserved for inherently not codable concepts without codable children: Secondary | ICD-10-CM

## 2014-10-22 DIAGNOSIS — Z0000 Encounter for general adult medical examination without abnormal findings: Secondary | ICD-10-CM

## 2014-10-22 NOTE — Patient Instructions (Signed)
No concerns on annual wellness visit identified.   Sorry your shoulder is not better-let's get orthopedic surgeries opinion given no improvement. They will call you. If you do not hear within 2 weeks, give [place] a call.

## 2014-10-22 NOTE — Progress Notes (Signed)
Scott Reddish, MD Phone: [phone]  Subjective:  Patient presents today to establish care with me as their new primary care provider and for annual wellness visit. Patient was formerly a patient of Dr. Leanne Chang. Chief complaint-noted.   Bilateral Shoulder Pain From last visit "Patient with a history of rotator cuff tendinitis in august which responded to prednisone. Patient states he did well for about 3 months but unfortunately he never received exercises for home exercise program. Patient states his pain started to recur about a month ago noticed a progressive since that time. Both arms seem to be involved at this point. He denies any neck pain. He does not have any pain with his arms are at rest but when he tries to lift his arms overhead he experiences moderate to severe pain. Pain is similar to the pain in August but bilateral. He denies any stiffness in shoulders and oriented or any stiffness or pain in any other areas. Pain is worse when he lays on shoulders."  Today patient reports Pain only had mild improvement perhaps 10%. Also has some mild neck pain. Admits he has pain in his hands for prolonged period attributed to arthritis. He is already on tramadol for low back pain. Has to avoid nsaids due to renal insufficiency.   ROS-no paresthesias in the hands, no extremity weakness. No pain with neck movement.   AWV Preventive Screening-Counseling & Management  Never Smoker Son smokes in the home in his room-advised to have him smoke outside Lives with wife and oldest son  Risk Factors He exercises regularly-walks twice a week for 1-2 hours.   Eats a generally healthy diet but likes a lot of sweets No fall risk  Cardiac risk factors: advanced age (older than 64 for men, 10 for women), and high cholesterol untreated due to statin intolerance. No diabetes. No known family CAD history. Hypertension-controlled.   Depression Screen None. PHQ2 0  Activities of Daily  Living Independent ADLs and IADLs  Hearing Difficulties: no hearing difficulties  Cognitive Testing He has no trouble.   List the Names of Other Physician/Practitioners you currently use: 1. Dr. Junious Silk [facility] (follows prostate) 2. Dr. Earlean [facility] 3. Optometrist Dr. Newman Nip 4. Dr. Deatra Ina dentistry  Immunization History  Administered Date(s) Administered  . Pneumococcal Conjugate-13 01/30/2014  . Pneumococcal Polysaccharide-23 06/16/2013  . Td 01/18/2009  Has never had flu shot and refuses.   Screening tests- up to date   The following were reviewed and entered/updated in epic: Past Medical History  Diagnosis Date  . B12 DEFICIENCY 07/07/2007  . HYPERLIPIDEMIA 07/13/2007  . Gout, unspecified 06/17/2010  . ANEMIA, OTHER UNSPEC 08/02/2009  . HYPERTENSION 07/13/2007  . GERD 07/13/2007  . RENAL INSUFFICIENCY 06/04/2010  . BPPV (benign paroxysmal positional vertigo)     2003  . Bell's palsy      around 2005   Patient Active Problem List   Diagnosis Date Noted  . Gout 06/17/2010    Priority: Medium  . 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 Hyperlipidemia 07/13/2007    Priority: Medium  . Essential hypertension 07/13/2007    Priority: Medium  . Low back pain 10/22/2014    Priority: Low  . Rotator cuff tendinitis 06/04/2014    Priority: Low  . Anemia 08/02/2009    Priority: Low  . GERD 07/13/2007    Priority: Low  . B12 deficiency 07/07/2007    Priority: Low   Past Surgical History  Procedure Laterality Date  .  Back surgery  07/13/07    Family History  Problem Relation Age of Onset  . Arthritis Father     Medications- reviewed and updated Current Outpatient Prescriptions  Medication Sig Dispense Refill  . allopurinol (ZYLOPRIM) 300 MG tablet TAKE 1 TABLET BY MOUTH ONCE A DAY 90 tablet 3  . colchicine (COLCRYS) 0.6 MG tablet At first sign of flare, take 2 pills and if pain continues, take 1 more pill 1  hour later. Following days take 1 pill until pain resolves. 30 tablet 2  . cyanocobalamin 1000 MCG tablet Inject 100 mcg as directed every 30 (thirty) days.     Marland Kitchen losartan (COZAAR) 50 MG tablet TAKE 1 TABLET BY MOUTH EVERY DAY 90 tablet 1  . Polyethylene Glycol 3350 POWD Take 17 g by mouth daily.      . RABEprazole (ACIPHEX) 20 MG tablet Take 20 mg by mouth daily.      . traMADol (ULTRAM) 50 MG tablet TAKE 2 TABLETS BY MOUTH TWICE A DAY 120 tablet 2  . triamterene-hydrochlorothiazide (MAXZIDE-25) 37.5-25 MG per tablet TAKE 1 TABLET BY MOUTH DAILY. 90 tablet 0   No current facility-administered medications for this visit.    Allergies-reviewed and updated Allergies  Allergen Reactions  . Nsaids     REACTION: hx of renal insufficiency  . Statins     myalgias    History   Social History  . Marital Status: Married    Spouse Name: N/A    Number of Children: N/A  . Years of Education: N/A   Social History Main Topics  . Smoking status: Never Smoker   . Smokeless tobacco: None  . Alcohol Use: No  . Drug Use: No  . Sexual Activity: None   Other Topics Concern  . None   Social History Narrative   Married 1965. 4 children. 11 grandchildren. 1 greatgrandchild on way in 2015.       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  Ext: no edema Skin: warm, dry Neuro: 5/5 strength in lower extremities with arms <90 degrees abducted or forward flexed.   Neck: Mild right sided neck pain and some muscle spasm.  Bilateral shoulders similar and reported below Inspection reveals no abnormalities, atrophy or asymmetry. Palpation is normal with no tenderness over AC joint or bicipital groove. Mild  tenderness over lateral aspect. There is some crepitus noted on right arm with lifting overhead.  ROM is full in all planes.Painful arc noted. No drop arm sign.  Rotator cuff strength normal throughout with exception 4/5 strength with empty can Negative Neer test. Hawkin's negative this time.  Signs of impingement noted empty can.  Assessment/Plan:  AWV completed without concerns.   Rotator cuff tendinitis I am concerned for neck DJD but we have treated as rotator cuff tendinitis. Originally treated with prednisone x 7 days and resolved but didn't start home exercise program. A few months later tried prednisone again with no relief. We will get orthopedic surgeries opinion at this point.   6 month follow up.   Orders Placed This Encounter  Procedures  . Ambulatory referral to Orthopedic Surgery    Referral Priority:  Routine    Referral Type:  Surgical    Referral Reason:  Specialty Services Required    Requested Specialty:  Orthopedic Surgery    Number of Visits Requested:  1

## 2014-10-22 NOTE — Assessment & Plan Note (Signed)
I am concerned for neck DJD but we have treated as rotator cuff tendinitis. Originally treated with prednisone x 7 days and resolved but didn't start home exercise program. A few months later tried prednisone again with no relief. We will get orthopedic surgeries opinion at this point.

## 2014-11-06 ENCOUNTER — Ambulatory Visit (INDEPENDENT_AMBULATORY_CARE_PROVIDER_SITE_OTHER): Payer: Medicare Other | Admitting: Family Medicine

## 2014-11-06 DIAGNOSIS — E538 Deficiency of other specified B group vitamins: Secondary | ICD-10-CM | POA: Diagnosis not present

## 2014-11-06 MED ORDER — CYANOCOBALAMIN 1000 MCG/ML IJ SOLN
1000.0000 ug | Freq: Once | INTRAMUSCULAR | Status: AC
  Administered 2014-11-06: 1000 ug via INTRAMUSCULAR

## 2014-12-02 ENCOUNTER — Other Ambulatory Visit: Payer: Self-pay | Admitting: Family Medicine

## 2014-12-03 ENCOUNTER — Ambulatory Visit (INDEPENDENT_AMBULATORY_CARE_PROVIDER_SITE_OTHER): Payer: Medicare Other | Admitting: Family Medicine

## 2014-12-03 ENCOUNTER — Encounter: Payer: Self-pay | Admitting: Family Medicine

## 2014-12-03 VITALS — BP 110/70 | Temp 98.0°F | Wt 182.0 lb

## 2014-12-03 DIAGNOSIS — N182 Chronic kidney disease, stage 2 (mild): Secondary | ICD-10-CM | POA: Diagnosis not present

## 2014-12-03 DIAGNOSIS — M7581 Other shoulder lesions, right shoulder: Secondary | ICD-10-CM

## 2014-12-03 LAB — BASIC METABOLIC PANEL
BUN: 16 mg/dL (ref 6–23)
CALCIUM: 9.5 mg/dL (ref 8.4–10.5)
CO2: 30 mEq/L (ref 19–32)
Chloride: 100 mEq/L (ref 96–112)
Creatinine, Ser: 1.42 mg/dL (ref 0.40–1.50)
GFR: 63.22 mL/min (ref >60.00)
Glucose, Bld: 97 mg/dL (ref 70–99)
Potassium: 3.9 mEq/L (ref 3.5–5.1)
Sodium: 137 mEq/L (ref 135–145)

## 2014-12-03 MED ORDER — CELECOXIB 100 MG PO CAPS: 100.0000 mg | ORAL_CAPSULE | Freq: Two times a day (BID) | ORAL | Status: DC | PRN

## 2014-12-03 NOTE — Patient Instructions (Signed)
Refill tramadol for back  If your kidney function was ok with taking celebrex, we will reorder celebrex. The risks are heart risk and kidney risk. Stop for any chest pain and see [place] in 3 months regardless to recheck kidney function. Try to use celebrex as minimal as possible.

## 2014-12-03 NOTE — Progress Notes (Signed)
Garret Reddish, MD Phone: [phone]  Subjective:   Scott [name] is a 69 y.o. year old very pleasant male patient who presents with the following:  Right shoulder pain (improved L shoulder pain after injection) Left shoulder pain now largely resolved after steroid injection by orthopedics in December. Right shoulder continues to bother him. Took celebrex for 6 days and this improved pain even more than the steroid shot per patient. Pain still with putting arms overhead primarily but aches with them down as well  Patient aware of CKD stage II or III which has been stable and wanted to evaluate the effects of medicine on his kidney function.   In regards to reflux, did not worsen on celebrex and takes aciphex more prn than scheduled  ROS-no paresthesias in the hands, no extremity weakness. No pain with neck movement. No chest pain, shortness of breath.   Past Medical History- Patient Active Problem List   Diagnosis Date Noted  . Gout 06/17/2010    Priority: Medium  . 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 Hyperlipidemia 07/13/2007    Priority: Medium  . Essential hypertension 07/13/2007    Priority: Medium  . Low back pain 10/22/2014    Priority: Low  . Rotator cuff tendinitis 06/04/2014    Priority: Low  . Anemia 08/02/2009    Priority: Low  . GERD 07/13/2007    Priority: Low  . B12 deficiency 07/07/2007    Priority: Low   Medications- reviewed and updated Current Outpatient Prescriptions  Medication Sig Dispense Refill  . allopurinol (ZYLOPRIM) 300 MG tablet TAKE 1 TABLET BY MOUTH ONCE A DAY 90 tablet 3  . colchicine (COLCRYS) 0.6 MG tablet At first sign of flare, take 2 pills and if pain continues, take 1 more pill 1 hour later. Following days take 1 pill until pain resolves. 30 tablet 2  . cyanocobalamin 1000 MCG tablet Inject 100 mcg as directed every 30 (thirty) days.     Marland Kitchen losartan (COZAAR) 50 MG tablet TAKE 1 TABLET BY MOUTH  EVERY DAY 90 tablet 1  . Polyethylene Glycol 3350 POWD Take 17 g by mouth daily.      . RABEprazole (ACIPHEX) 20 MG tablet Take 20 mg by mouth daily.      Marland Kitchen triamterene-hydrochlorothiazide (MAXZIDE-25) 37.5-25 MG per tablet TAKE 1 TABLET BY MOUTH DAILY. 90 tablet 0  . traMADol (ULTRAM) 50 MG tablet TAKE 2 TABLETS BY MOUTH TWICE A DAY 120 tablet 2   No current facility-administered medications for this visit.    Objective: BP 110/70 mmHg  Temp(Src) 98 F (36.7 C)  Wt 182 lb (82.555 kg) Gen: NAD, resting comfortably CV: RRR no murmurs rubs or gallops Lungs: CTAB no crackles, wheeze, rhonchi  MSK Negative spurlings Left shoulder improved from previous exam Following exam for right arm: Inspection reveals no abnormalities, atrophy or asymmetry. Palpation is normal with no tenderness over AC joint or bicipital groove. Mild tenderness over lateral aspect. There is some crepitus noted on right arm with lifting overhead.  ROM is full in all planes.Painful arc noted. No drop arm sign.  Rotator cuff strength normal throughout with exception 4/-5 strength with empty can Empty can, hawkins, and neer always positive on right    Assessment/Plan:  Rotator cuff tendinitis Continues to have R arm pain. His L arm pain was worse at time of orthopedic visit so got injection in that arm. He still has low level pain on left that resolves  with celebrex and continued pain in R arm that also resolves with celebrex. With CKD stage ii-iii history we will get a BMET today to evaluate effects of celebrex. If no worsening, will prescribe celebrex (discussed cardiac risks) with follow up in 3 months to see frequency of use and repeat BMET. GIven this is still primarily pain with lifting arms overhead I suspect rotator cuff as primary issues. If continued issues that we cannot control or that do not tolerate celebrex , may need to see ortho again    Return precautions advised. 3 month follow up planned.    Order celebrex if Cr/gfr stable Orders Placed This Encounter  Procedures  . Basic metabolic panel    B[place]

## 2014-12-03 NOTE — Assessment & Plan Note (Addendum)
Continues to have R arm pain. His L arm pain was worse at time of orthopedic visit so got injection in that arm. He still has low level pain on left that resolves with celebrex and continued pain in R arm that also resolves with celebrex. With CKD stage ii-iii history we will get a BMET today to evaluate effects of celebrex. If no worsening, will prescribe celebrex (discussed cardiac risks) with follow up in 3 months to see frequency of use and repeat BMET. GIven this is still primarily pain with lifting arms overhead I suspect rotator cuff as primary issues. If continued issues that we cannot control or that do not tolerate celebrex , may need to see ortho again  Addendum. GFR stable around 60 (likely higher given african [place]). Celebrex prescribed up to 100mg  bid prn.

## 2014-12-03 NOTE — Addendum Note (Signed)
Addended by: Marin Olp on: 12/03/2014 05:30 PM   Modules accepted: Orders

## 2014-12-06 ENCOUNTER — Ambulatory Visit (INDEPENDENT_AMBULATORY_CARE_PROVIDER_SITE_OTHER): Payer: Medicare Other | Admitting: Family Medicine

## 2014-12-06 DIAGNOSIS — E538 Deficiency of other specified B group vitamins: Secondary | ICD-10-CM

## 2014-12-06 MED ORDER — CYANOCOBALAMIN 1000 MCG/ML IJ SOLN
1000.0000 ug | Freq: Once | INTRAMUSCULAR | Status: AC
  Administered 2014-12-06: 1000 ug via INTRAMUSCULAR

## 2014-12-20 ENCOUNTER — Other Ambulatory Visit: Payer: Self-pay | Admitting: Internal Medicine

## 2015-01-04 ENCOUNTER — Ambulatory Visit (INDEPENDENT_AMBULATORY_CARE_PROVIDER_SITE_OTHER): Payer: Medicare Other | Admitting: Family Medicine

## 2015-01-04 DIAGNOSIS — E538 Deficiency of other specified B group vitamins: Secondary | ICD-10-CM

## 2015-01-04 MED ORDER — CYANOCOBALAMIN 1000 MCG/ML IJ SOLN
1000.0000 ug | Freq: Once | INTRAMUSCULAR | Status: AC
  Administered 2015-01-04: 1000 ug via INTRAMUSCULAR

## 2015-01-05 ENCOUNTER — Other Ambulatory Visit: Payer: Self-pay | Admitting: Family Medicine

## 2015-01-10 ENCOUNTER — Other Ambulatory Visit: Payer: Self-pay | Admitting: Family Medicine

## 2015-02-05 ENCOUNTER — Ambulatory Visit (INDEPENDENT_AMBULATORY_CARE_PROVIDER_SITE_OTHER): Payer: Medicare Other | Admitting: Family Medicine

## 2015-02-05 DIAGNOSIS — E538 Deficiency of other specified B group vitamins: Secondary | ICD-10-CM | POA: Diagnosis not present

## 2015-02-05 MED ORDER — CYANOCOBALAMIN 1000 MCG/ML IJ SOLN
1000.0000 ug | Freq: Once | INTRAMUSCULAR | Status: AC
  Administered 2015-02-05: 1000 ug via INTRAMUSCULAR

## 2015-03-05 ENCOUNTER — Telehealth: Payer: Self-pay | Admitting: Family Medicine

## 2015-03-05 NOTE — Telephone Encounter (Signed)
Ok to refill 

## 2015-03-05 NOTE — Telephone Encounter (Signed)
Ok to fill 

## 2015-03-05 NOTE — Telephone Encounter (Signed)
Pt request refill of the following: traMADol (ULTRAM) 50 MG tablet  Pt need to pick this up today    Phamacy:  [facility] [address]

## 2015-03-06 MED ORDER — TRAMADOL HCL 50 MG PO TABS: 100.0000 mg | ORAL_TABLET | Freq: Two times a day (BID) | ORAL | Status: DC

## 2015-03-06 NOTE — Telephone Encounter (Signed)
Medication called in 

## 2015-03-07 ENCOUNTER — Ambulatory Visit (INDEPENDENT_AMBULATORY_CARE_PROVIDER_SITE_OTHER): Payer: Medicare Other | Admitting: Family Medicine

## 2015-03-07 DIAGNOSIS — E538 Deficiency of other specified B group vitamins: Secondary | ICD-10-CM

## 2015-03-07 MED ORDER — CYANOCOBALAMIN 1000 MCG/ML IJ SOLN
1000.0000 ug | Freq: Once | INTRAMUSCULAR | Status: AC
  Administered 2015-03-07: 1000 ug via INTRAMUSCULAR

## 2015-03-25 ENCOUNTER — Ambulatory Visit (INDEPENDENT_AMBULATORY_CARE_PROVIDER_SITE_OTHER): Payer: Medicare Other | Admitting: Family Medicine

## 2015-03-25 ENCOUNTER — Encounter: Payer: Self-pay | Admitting: Family Medicine

## 2015-03-25 VITALS — BP 122/78 | HR 82 | Temp 98.0°F | Ht 70.25 in | Wt 181.0 lb

## 2015-03-25 DIAGNOSIS — J309 Allergic rhinitis, unspecified: Secondary | ICD-10-CM | POA: Diagnosis not present

## 2015-03-25 MED ORDER — FLUTICASONE PROPIONATE 50 MCG/ACT NA SUSP: 2.0000 | Freq: Every day | NASAL | Status: DC

## 2015-03-25 NOTE — Progress Notes (Signed)
HPI:  Allergies: -started: this spring, but worse since mowing the lawn 3 days ago -symptoms:nasal congestion,  Itchy nose, sneezing, PND -denies:fever, SOB, NVD, tooth pain, facial pain -has tried:  -sick contacts/travel/risks: denies flu exposure, tick exposure or or Ebola risks -Hx of: allergies - reports had this last year and had a zpack  ROS: See pertinent positives and negatives per HPI.  Past Medical History  Diagnosis Date  . B12 DEFICIENCY 07/07/2007  . HYPERLIPIDEMIA 07/13/2007  . Gout, unspecified 06/17/2010  . ANEMIA, OTHER UNSPEC 08/02/2009  . HYPERTENSION 07/13/2007  . GERD 07/13/2007  . RENAL INSUFFICIENCY 06/04/2010  . BPPV (benign paroxysmal positional vertigo)     2003  . Bell's palsy      around 2005    Past Surgical History  Procedure Laterality Date  . Back surgery  07/13/07    Family History  Problem Relation Age of Onset  . Arthritis Father     History   Social History  . Marital Status: Married    Spouse Name: N/A  . Number of Children: N/A  . Years of Education: N/A   Social History Main Topics  . Smoking status: Never Smoker   . Smokeless tobacco: Not on file  . Alcohol Use: No  . Drug Use: No  . Sexual Activity: Not on file   Other Topics Concern  . None   Social History Narrative   Married 1965. 4 children. 11 grandchildren. 1 greatgrandchild on way in 2015.       Retired from [employer]: [employer], sports     Current outpatient prescriptions:  .  allopurinol (ZYLOPRIM) 300 MG tablet, TAKE 1 TABLET BY MOUTH ONCE A DAY, Disp: 90 tablet, Rfl: 3 .  celecoxib (CELEBREX) 100 MG capsule, Take 1 capsule (100 mg total) by mouth 2 (two) times daily as needed for moderate pain (shoulder)., Disp: 60 capsule, Rfl: 2 .  colchicine (COLCRYS) 0.6 MG tablet, At first sign of flare, take 2 pills and if pain continues, take 1 more pill 1 hour later. Following days take 1 pill until pain resolves., Disp: 30 tablet, Rfl: 2 .   cyanocobalamin 1000 MCG tablet, Inject 100 mcg as directed every 30 (thirty) days. , Disp: , Rfl:  .  losartan (COZAAR) 50 MG tablet, TAKE 1 TABLET BY MOUTH EVERY DAY, Disp: 90 tablet, Rfl: 1 .  Polyethylene Glycol 3350 POWD, Take 17 g by mouth daily.  , Disp: , Rfl:  .  RABEprazole (ACIPHEX) 20 MG tablet, Take 20 mg by mouth daily.  , Disp: , Rfl:  .  traMADol (ULTRAM) 50 MG tablet, Take 2 tablets (100 mg total) by mouth 2 (two) times daily., Disp: 120 tablet, Rfl: 2 .  triamterene-hydrochlorothiazide (MAXZIDE-25) 37.5-25 MG per tablet, TAKE 1 TABLET BY MOUTH DAILY., Disp: 90 tablet, Rfl: 1 .  fluticasone (FLONASE) 50 MCG/ACT nasal spray, Place 2 sprays into both nostrils daily., Disp: 16 g, Rfl: 1  EXAM:  Filed Vitals:   03/25/15 1401  BP: 122/78  Pulse: 82  Temp: 98 F (36.7 C)    Body mass index is 25.8 kg/(m^2).  GENERAL: vitals reviewed and listed above, alert, oriented, appears well hydrated and in no acute distress  HEENT: atraumatic, conjunttiva clear, no obvious abnormalities on inspection of external nose and ears, normal appearance of ear canals and TMs, clear nasal congestion, mild post oropharyngeal erythema with PND, no tonsillar edema or exudate, no sinus TTP  NECK: no  obvious masses on inspection  LUNGS: clear to auscultation bilaterally, no wheezes, rales or rhonchi, good air movement  CV: HRRR, no peripheral edema  MS: moves all extremities without noticeable abnormality  PSYCH: pleasant and cooperative, no obvious depression or anxiety  ASSESSMENT AND PLAN:  Discussed the following assessment and plan:  Allergic rhinitis, unspecified allergic rhinitis type - Plan: fluticasone (FLONASE) 50 MCG/ACT nasal spray  -given HPI and exam findings today, a serious infection or illness is unlikely. We discussed potential etiologies, with AR or VURI being most likely, and advised supportive care and monitoring. We discussed treatment side effects, likely course,  antibiotic misuse, transmission, and signs of developing a serious illness. -of course, we advised to return or notify a doctor immediately if symptoms worsen or persist or new concerns arise.    Patient Instructions  claritin daily for 21 days  flonase 2 sprays each nostril daily for 2 days  Follow up if worsening or not improving as expectd     KIM, HANNAH R.

## 2015-03-25 NOTE — Patient Instructions (Addendum)
claritin daily for 21 days  flonase 2 sprays each nostril daily for 2 days  Follow up if worsening or not improving as expected  AFRIN for 3 days, then stop

## 2015-03-25 NOTE — Progress Notes (Signed)
Pre visit review using our clinic review tool, if applicable. No additional management support is needed unless otherwise documented below in the visit note. 

## 2015-04-03 ENCOUNTER — Ambulatory Visit (INDEPENDENT_AMBULATORY_CARE_PROVIDER_SITE_OTHER): Payer: Medicare Other | Admitting: Family Medicine

## 2015-04-03 DIAGNOSIS — E538 Deficiency of other specified B group vitamins: Secondary | ICD-10-CM | POA: Diagnosis not present

## 2015-04-03 MED ORDER — CYANOCOBALAMIN 1000 MCG/ML IJ SOLN
1000.0000 ug | Freq: Once | INTRAMUSCULAR | Status: AC
  Administered 2015-04-03: 1000 ug via INTRAMUSCULAR

## 2015-04-05 ENCOUNTER — Ambulatory Visit: Payer: Medicare Other | Admitting: Family Medicine

## 2015-04-24 ENCOUNTER — Other Ambulatory Visit: Payer: Self-pay | Admitting: Internal Medicine

## 2015-05-03 ENCOUNTER — Ambulatory Visit (INDEPENDENT_AMBULATORY_CARE_PROVIDER_SITE_OTHER): Payer: Medicare Other | Admitting: Family Medicine

## 2015-05-03 DIAGNOSIS — E538 Deficiency of other specified B group vitamins: Secondary | ICD-10-CM

## 2015-05-03 MED ORDER — CYANOCOBALAMIN 1000 MCG/ML IJ SOLN
1000.0000 ug | Freq: Once | INTRAMUSCULAR | Status: AC
  Administered 2015-05-03: 1000 ug via INTRAMUSCULAR

## 2015-06-03 ENCOUNTER — Ambulatory Visit (INDEPENDENT_AMBULATORY_CARE_PROVIDER_SITE_OTHER): Payer: Medicare Other | Admitting: Family Medicine

## 2015-06-03 DIAGNOSIS — E538 Deficiency of other specified B group vitamins: Secondary | ICD-10-CM

## 2015-06-03 MED ORDER — CYANOCOBALAMIN 1000 MCG/ML IJ SOLN
1000.0000 ug | Freq: Once | INTRAMUSCULAR | Status: AC
  Administered 2015-06-03: 1000 ug via INTRAMUSCULAR

## 2015-06-07 ENCOUNTER — Other Ambulatory Visit: Payer: Self-pay | Admitting: Family Medicine

## 2015-06-07 NOTE — Telephone Encounter (Signed)
Yes thanks, may fill 

## 2015-06-17 ENCOUNTER — Other Ambulatory Visit: Payer: Self-pay | Admitting: Family Medicine

## 2015-07-04 ENCOUNTER — Ambulatory Visit (INDEPENDENT_AMBULATORY_CARE_PROVIDER_SITE_OTHER): Payer: Medicare Other | Admitting: Family Medicine

## 2015-07-04 DIAGNOSIS — E538 Deficiency of other specified B group vitamins: Secondary | ICD-10-CM

## 2015-07-04 MED ORDER — CYANOCOBALAMIN 1000 MCG/ML IJ SOLN
1000.0000 ug | Freq: Once | INTRAMUSCULAR | Status: AC
  Administered 2015-07-04: 1000 ug via INTRAMUSCULAR

## 2015-07-09 ENCOUNTER — Other Ambulatory Visit: Payer: Self-pay | Admitting: Family Medicine

## 2015-07-10 NOTE — Telephone Encounter (Signed)
Tramadol ok to refill 

## 2015-07-10 NOTE — Telephone Encounter (Signed)
Yes thanks, may give 3 refills

## 2015-07-31 ENCOUNTER — Other Ambulatory Visit: Payer: Self-pay | Admitting: Family Medicine

## 2015-08-06 ENCOUNTER — Other Ambulatory Visit: Payer: Self-pay | Admitting: Family Medicine

## 2015-08-06 ENCOUNTER — Ambulatory Visit (INDEPENDENT_AMBULATORY_CARE_PROVIDER_SITE_OTHER): Payer: Medicare Other | Admitting: Family Medicine

## 2015-08-06 DIAGNOSIS — E538 Deficiency of other specified B group vitamins: Secondary | ICD-10-CM | POA: Diagnosis not present

## 2015-08-06 MED ORDER — CYANOCOBALAMIN 1000 MCG/ML IJ SOLN
1000.0000 ug | Freq: Once | INTRAMUSCULAR | Status: AC
  Administered 2015-08-06: 1000 ug via INTRAMUSCULAR

## 2015-09-05 ENCOUNTER — Ambulatory Visit (INDEPENDENT_AMBULATORY_CARE_PROVIDER_SITE_OTHER): Payer: Medicare Other | Admitting: Family Medicine

## 2015-09-05 DIAGNOSIS — E538 Deficiency of other specified B group vitamins: Secondary | ICD-10-CM

## 2015-09-05 MED ORDER — CYANOCOBALAMIN 1000 MCG/ML IJ SOLN
1000.0000 ug | Freq: Once | INTRAMUSCULAR | Status: AC
  Administered 2015-09-05: 1000 ug via INTRAMUSCULAR

## 2015-10-07 ENCOUNTER — Ambulatory Visit (INDEPENDENT_AMBULATORY_CARE_PROVIDER_SITE_OTHER): Payer: Medicare Other | Admitting: Family Medicine

## 2015-10-07 DIAGNOSIS — E538 Deficiency of other specified B group vitamins: Secondary | ICD-10-CM | POA: Diagnosis not present

## 2015-10-07 MED ORDER — CYANOCOBALAMIN 1000 MCG/ML IJ SOLN
1000.0000 ug | Freq: Once | INTRAMUSCULAR | Status: AC
  Administered 2015-10-07: 1000 ug via INTRAMUSCULAR

## 2015-11-07 ENCOUNTER — Ambulatory Visit (INDEPENDENT_AMBULATORY_CARE_PROVIDER_SITE_OTHER): Payer: Medicare Other | Admitting: Family Medicine

## 2015-11-07 DIAGNOSIS — E538 Deficiency of other specified B group vitamins: Secondary | ICD-10-CM

## 2015-11-07 MED ORDER — CYANOCOBALAMIN 1000 MCG/ML IJ SOLN
1000.0000 ug | Freq: Once | INTRAMUSCULAR | Status: AC
  Administered 2015-11-07: 1000 ug via INTRAMUSCULAR

## 2015-11-18 ENCOUNTER — Other Ambulatory Visit: Payer: Self-pay | Admitting: Family Medicine

## 2015-11-18 NOTE — Telephone Encounter (Signed)
Refill ok? 

## 2015-11-18 NOTE — Telephone Encounter (Signed)
Yes thanks 

## 2015-11-21 ENCOUNTER — Encounter: Payer: Medicare Other | Admitting: Family Medicine

## 2015-12-06 ENCOUNTER — Ambulatory Visit (INDEPENDENT_AMBULATORY_CARE_PROVIDER_SITE_OTHER): Payer: Medicare Other | Admitting: Family Medicine

## 2015-12-06 DIAGNOSIS — E538 Deficiency of other specified B group vitamins: Secondary | ICD-10-CM | POA: Diagnosis not present

## 2015-12-06 MED ORDER — CYANOCOBALAMIN 1000 MCG/ML IJ SOLN
1000.0000 ug | Freq: Once | INTRAMUSCULAR | Status: AC
Start: 2015-12-06 — End: 2015-12-06
  Administered 2015-12-06: 1000 ug via INTRAMUSCULAR

## 2016-01-03 ENCOUNTER — Ambulatory Visit (INDEPENDENT_AMBULATORY_CARE_PROVIDER_SITE_OTHER): Payer: Medicare Other | Admitting: Family Medicine

## 2016-01-03 DIAGNOSIS — E538 Deficiency of other specified B group vitamins: Secondary | ICD-10-CM

## 2016-01-03 MED ORDER — CYANOCOBALAMIN 1000 MCG/ML IJ SOLN
1000.0000 ug | Freq: Once | INTRAMUSCULAR | Status: AC
  Administered 2016-01-03: 1000 ug via INTRAMUSCULAR

## 2016-01-14 ENCOUNTER — Telehealth: Payer: Self-pay | Admitting: Family Medicine

## 2016-01-14 NOTE — Telephone Encounter (Signed)
See below, ok to work in today? Or  we have openings tomorrow.

## 2016-01-14 NOTE — Telephone Encounter (Signed)
Pt thinks he has gout and would like appt today

## 2016-01-14 NOTE — Telephone Encounter (Signed)
Pt has been sch

## 2016-01-14 NOTE — Telephone Encounter (Signed)
Used 11:30 am SDA 01/16/16 for this pt

## 2016-01-14 NOTE — Telephone Encounter (Signed)
Dr. Yong Channel unable to work pt in this pm. Please see if pt can come in tomorrow in one of the SDA slots.

## 2016-01-15 ENCOUNTER — Ambulatory Visit: Payer: Medicare Other | Admitting: Family Medicine

## 2016-01-16 ENCOUNTER — Ambulatory Visit (INDEPENDENT_AMBULATORY_CARE_PROVIDER_SITE_OTHER): Payer: Medicare Other | Admitting: Family Medicine

## 2016-01-16 ENCOUNTER — Encounter: Payer: Self-pay | Admitting: Family Medicine

## 2016-01-16 VITALS — BP 110/70 | HR 77 | Temp 98.1°F | Wt 180.0 lb

## 2016-01-16 DIAGNOSIS — M1A09X0 Idiopathic chronic gout, multiple sites, without tophus (tophi): Secondary | ICD-10-CM

## 2016-01-16 MED ORDER — PREDNISONE 20 MG PO TABS: ORAL_TABLET | ORAL | Status: DC

## 2016-01-16 NOTE — Patient Instructions (Signed)
Gout flare Let's trial prednisone to try to calm this down for 7 days  See you next week Consider drawing off fluid here if not improved or refer to orthopedics or sports medicine

## 2016-01-16 NOTE — Progress Notes (Signed)
Garret Reddish, MD  Subjective:  Scott [name] is a 70 y.o. year old very pleasant male patient who presents for/with See problem oriented charting ROS- no fever, chills, nausea, vomiting, abnormal fatigue  Past Medical History-  Patient Active Problem List   Diagnosis Date Noted  . Gout 06/17/2010    Priority: Medium  . 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 Hyperlipidemia 07/13/2007    Priority: Medium  . Essential hypertension 07/13/2007    Priority: Medium  . Low back pain 10/22/2014    Priority: Low  . Rotator cuff tendinitis 06/04/2014    Priority: Low  . Anemia 08/02/2009    Priority: Low  . GERD 07/13/2007    Priority: Low  . B12 deficiency 07/07/2007    Priority: Low    Medications- reviewed and updated Current Outpatient Prescriptions  Medication Sig Dispense Refill  . allopurinol (ZYLOPRIM) 300 MG tablet TAKE 1 TABLET BY MOUTH ONCE A DAY 90 tablet 3  . celecoxib (CELEBREX) 100 MG capsule Take 1 capsule (100 mg total) by mouth 2 (two) times daily as needed for moderate pain (shoulder). 60 capsule 2  . colchicine 0.6 MG tablet TAKE 1 TABLET BY MOUTH DAILY 30 tablet 5  . cyanocobalamin 1000 MCG tablet Inject 100 mcg as directed every 30 (thirty) days.     . fluticasone (FLONASE) 50 MCG/ACT nasal spray Place 2 sprays into both nostrils daily. 16 g 1  . losartan (COZAAR) 50 MG tablet TAKE 1 TABLET BY MOUTH EVERY DAY 90 tablet 3  . Polyethylene Glycol 3350 POWD Take 17 g by mouth daily.      . RABEprazole (ACIPHEX) 20 MG tablet Take 20 mg by mouth daily.      Marland Kitchen triamterene-hydrochlorothiazide (MAXZIDE-25) 37.5-25 MG per tablet TAKE 1 TABLET BY MOUTH DAILY. 90 tablet 3  . traMADol (ULTRAM) 50 MG tablet TAKE 2 TABS BY MOUTH 2 TIMES DAILY (Patient not taking: Reported on 01/16/2016) 120 tablet 2   Objective: BP 110/70 mmHg  Pulse 77  Temp(Src) 98.1 F (36.7 C)  Wt 180 lb (81.647 kg) Gen: NAD, resting comfortably, walks with slight  limp CV: RRR no murmurs rubs or gallops Lungs: CTAB no crackles, wheeze, rhonchi  Right Knee: Inspection with no erythema but with notable effusion  Patient with pain of palpation around joint and on joint line.  ROM full in flexion and extension and lower leg rotation. But with some pain Ligaments with solid consistent endpoints including ACL, PCL, LCL, MCL. Negative Mcmurray's Patellar and quadriceps tendons unremarkable. Hamstring and quadriceps strength is normal.   Assessment/Plan:  Gout S: Patient was working on his knees last Wednesday and noted knee pain afterwards that worsened and worsened. By Friday felt like his typical gout though he has not had an attack in his knee in sometime (prior arthrocentesis required as fluid would not go down over 10 years ago). He took 3 colchicine on first day then 1 a day since then. Swelling and pain have improved singificantly but normally would be compeltely gone by this point so decided to come in. Rates pain as moderate aching. A/P: we considered arthrocentesis or referral for this today but ultimately decided on 7 day trial of prednisone as I see him back next week for CPE- may do arthrocentesis then vs. Refer. Also we discussed likely will need to trial off the hctz (would have to stop triamterene as well as could have hyperkalemia). We will do this next  visit  could also have some underlying OA  Return precautions advised.   Meds ordered this encounter  Medications  . predniSONE (DELTASONE) 20 MG tablet    Sig: Take 2 pills for 3 days, 1 pill for 4 days    Dispense:  10 tablet    Refill:  0

## 2016-01-16 NOTE — Assessment & Plan Note (Signed)
S: Patient was working on his knees last Wednesday and noted knee pain afterwards that worsened and worsened. By Friday felt like his typical gout though he has not had an attack in his knee in sometime (prior arthrocentesis required as fluid would not go down over 10 years ago). He took 3 colchicine on first day then 1 a day since then. Swelling and pain have improved singificantly but normally would be compeltely gone by this point so decided to come in. Rates pain as moderate aching. A/P: we considered arthrocentesis or referral for this today but ultimately decided on 7 day trial of prednisone as I see him back next week for CPE- may do arthrocentesis then vs. Refer. Also we discussed likely will need to trial off the hctz (would have to stop triamterene as well as could have hyperkalemia). We will do this next visit

## 2016-01-24 ENCOUNTER — Other Ambulatory Visit: Payer: Self-pay | Admitting: Family Medicine

## 2016-01-24 ENCOUNTER — Encounter: Payer: Self-pay | Admitting: Family Medicine

## 2016-01-24 ENCOUNTER — Ambulatory Visit (INDEPENDENT_AMBULATORY_CARE_PROVIDER_SITE_OTHER): Payer: Medicare Other | Admitting: Family Medicine

## 2016-01-24 VITALS — BP 120/74 | HR 70 | Temp 98.3°F | Ht 69.5 in | Wt 176.0 lb

## 2016-01-24 DIAGNOSIS — N401 Enlarged prostate with lower urinary tract symptoms: Secondary | ICD-10-CM

## 2016-01-24 DIAGNOSIS — Z0000 Encounter for general adult medical examination without abnormal findings: Secondary | ICD-10-CM | POA: Diagnosis not present

## 2016-01-24 DIAGNOSIS — R351 Nocturia: Secondary | ICD-10-CM | POA: Diagnosis not present

## 2016-01-24 DIAGNOSIS — E785 Hyperlipidemia, unspecified: Secondary | ICD-10-CM | POA: Diagnosis not present

## 2016-01-24 DIAGNOSIS — I10 Essential (primary) hypertension: Secondary | ICD-10-CM

## 2016-01-24 DIAGNOSIS — M1A09X0 Idiopathic chronic gout, multiple sites, without tophus (tophi): Secondary | ICD-10-CM

## 2016-01-24 DIAGNOSIS — Z20828 Contact with and (suspected) exposure to other viral communicable diseases: Secondary | ICD-10-CM | POA: Diagnosis not present

## 2016-01-24 LAB — CBC
HCT: 42.9 % (ref 39.0–52.0)
HEMOGLOBIN: 14 g/dL (ref 13.0–17.0)
MCHC: 32.6 g/dL (ref 30.0–36.0)
MCV: 84.3 fl (ref 78.0–100.0)
Platelets: 256 10*3/uL (ref 150.0–400.0)
RBC: 5.09 Mil/uL (ref 4.22–5.81)
RDW: 16.1 % — AB (ref 11.5–15.5)
WBC: 6.4 10*3/uL (ref 4.0–10.5)

## 2016-01-24 LAB — COMPREHENSIVE METABOLIC PANEL
ALBUMIN: 4.4 g/dL (ref 3.5–5.2)
ALT: 17 U/L (ref 0–53)
AST: 17 U/L (ref 0–37)
Alkaline Phosphatase: 82 U/L (ref 39–117)
BUN: 24 mg/dL — AB (ref 6–23)
CHLORIDE: 94 meq/L — AB (ref 96–112)
CO2: 34 mEq/L — ABNORMAL HIGH (ref 19–32)
CREATININE: 1.46 mg/dL (ref 0.40–1.50)
Calcium: 9.5 mg/dL (ref 8.4–10.5)
GFR: 61.02 mL/min (ref >60.00)
GLUCOSE: 88 mg/dL (ref 70–99)
Potassium: 3.8 mEq/L (ref 3.5–5.1)
SODIUM: 135 meq/L (ref 135–145)
Total Bilirubin: 0.6 mg/dL (ref 0.2–1.2)
Total Protein: 7.7 g/dL (ref 6.0–8.3)

## 2016-01-24 LAB — LIPID PANEL
CHOL/HDL RATIO: 4
CHOLESTEROL: 215 mg/dL — AB (ref 0–200)
HDL: 48.3 mg/dL (ref >39.00)
NONHDL: 166.39
Triglycerides: 223 mg/dL — ABNORMAL HIGH (ref 0.0–149.0)
VLDL: 44.6 mg/dL — ABNORMAL HIGH (ref 0.0–40.0)

## 2016-01-24 LAB — LDL CHOLESTEROL, DIRECT: LDL DIRECT: 131 mg/dL

## 2016-01-24 LAB — HEPATITIS C ANTIBODY: HCV AB: NEGATIVE

## 2016-01-24 LAB — PSA: PSA: 4.16 ng/mL — ABNORMAL HIGH (ref 0.10–4.00)

## 2016-01-24 NOTE — Patient Instructions (Signed)
I think you are doing great other than that recent gout flare  Let's have you back in 6 months for annual wellness visit  Lets get labs before you leave

## 2016-01-24 NOTE — Progress Notes (Signed)
Scott Reddish, MD Phone: [phone]  Subjective:  Patient presents today for their annual physical. Chief complaint-noted.   See problem oriented charting- ROS- full  review of systems was completed and negative except for: some right knee pain and swelling with recent gout flare- improving now. No chest pain or shortness of breath. No headache or blurry vision.   The following were reviewed and entered/updated in epic: Past Medical History  Diagnosis Date  . B12 DEFICIENCY 07/07/2007  . HYPERLIPIDEMIA 07/13/2007  . Gout, unspecified 06/17/2010  . ANEMIA, OTHER UNSPEC 08/02/2009  . HYPERTENSION 07/13/2007  . GERD 07/13/2007  . RENAL INSUFFICIENCY 06/04/2010  . BPPV (benign paroxysmal positional vertigo)     2003  . Bell's palsy      around 2005   Patient Active Problem List   Diagnosis Date Noted  . Gout 06/17/2010    Priority: Medium  . 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 Hyperlipidemia 07/13/2007    Priority: Medium  . Essential hypertension 07/13/2007    Priority: Medium  . GERD 07/13/2007    Priority: Medium  . Low back pain 10/22/2014    Priority: Low  . Rotator cuff tendinitis 06/04/2014    Priority: Low  . Anemia 08/02/2009    Priority: Low  . B12 deficiency 07/07/2007    Priority: Low   Past Surgical History  Procedure Laterality Date  . Back surgery  07/13/07    Family History  Problem Relation Age of Onset  . Arthritis Father     Medications- reviewed and updated Current Outpatient Prescriptions  Medication Sig Dispense Refill  . allopurinol (ZYLOPRIM) 300 MG tablet TAKE 1 TABLET BY MOUTH ONCE A DAY 90 tablet 3  . celecoxib (CELEBREX) 100 MG capsule Take 1 capsule (100 mg total) by mouth 2 (two) times daily as needed for moderate pain (shoulder). 60 capsule 2  . colchicine 0.6 MG tablet TAKE 1 TABLET BY MOUTH DAILY 30 tablet 5  . cyanocobalamin 1000 MCG tablet Inject 100 mcg as directed every 30 (thirty) days.       . fluticasone (FLONASE) 50 MCG/ACT nasal spray Place 2 sprays into both nostrils daily. 16 g 1  . losartan (COZAAR) 50 MG tablet TAKE 1 TABLET BY MOUTH EVERY DAY 90 tablet 3  . Polyethylene Glycol 3350 POWD Take 17 g by mouth daily.      . RABEprazole (ACIPHEX) 20 MG tablet Take 20 mg by mouth daily.      . traMADol (ULTRAM) 50 MG tablet TAKE 2 TABS BY MOUTH 2 TIMES DAILY 120 tablet 2  . triamterene-hydrochlorothiazide (MAXZIDE-25) 37.5-25 MG per tablet TAKE 1 TABLET BY MOUTH DAILY. 90 tablet 3   No current facility-administered medications for this visit.    Allergies-reviewed and updated Allergies  Allergen Reactions  . Nsaids     REACTION: hx of renal insufficiency  . Statins     myalgias    Social History   Social History  . Marital Status: Married    Spouse Name: N/A  . Number of Children: N/A  . Years of Education: N/A   Social History Main Topics  . Smoking status: Never Smoker   . Smokeless tobacco: None  . Alcohol Use: No  . Drug Use: No  . Sexual Activity: Not Asked   Other Topics Concern  . None   Social History Narrative   Married 1965. 4 children. 11 grandchildren. 1 greatgrandchild on way in 2015.  Retired from [employer]: [employer], sports    ROS--See HPI   Objective: BP 120/74 mmHg  Pulse 70  Temp(Src) 98.3 F (36.8 C) (Oral)  Ht 5' 9.5" (1.765 m)  Wt 176 lb (79.833 kg)  BMI 25.63 kg/m2 Gen: NAD, resting comfortably, thin HEENT: Mucous membranes are moist. Oropharynx normal Neck: no thyromegaly CV: RRR no murmurs rubs or gallops Lungs: CTAB no crackles, wheeze, rhonchi Abdomen: soft/nontender/nondistended/normal bowel sounds. No rebound or guarding.  Rectal: normal tone, diffusely enlarged prostate, no masses or tenderness Ext: no edema Skin: warm, dry Neuro: grossly normal, moves all extremities, PERRLA  Assessment/Plan:  70 y.o. male presenting for annual physical.  Health Maintenance counseling: 1.  Anticipatory guidance: Patient counseled regarding regular dental exams, eye exams.  2. Risk factor reduction:  Advised patient of need for regular exercise and diet rich and fruits and vegetables to reduce risk of heart attack and stroke. Once knee fully recovered- encouraged to increase activity.  3. Immunizations/screenings/ancillary studies- declines shingles shot Health Maintenance Due  Topic Date Due  . Hepatitis C Screening - today September 02, 1946   4. Prostate cancer screening- BPH on exam- suspect that is cause for elevated BPH- will update level today  Lab Results  Component Value Date   PSA 3.77 03/01/2014   PSA 4.11* 01/23/2014   PSA 3.68 06/16/2013   5. Colon cancer screening - 06/09/12 with Dr. Earlean Shawl- patient thinks needs 5 year follow up which would be next year  Hyperlipidemia- poor control in past but has been statin intolerant- consider once a week medication Gout- on allopurinol 300mg  , no regular flare ups other than flare 1-2 weeks ago Hypertension- controlled on losartan 100mg  and triamterine hctz 37.5-25mg  CKD II- GFR just around 60- try to avoid nsaids but does use celebrex some for shoulder- very sparing GERD- only taking PPI prn Mentions R knee pain- some swelling- declines steroid shot.  BPH with nocturia about once a night- wants to continue screening until age 45  Return in about 6 months (around 07/26/2016) for annual wellness visit. Return precautions advised.   Orders Placed This Encounter  Procedures  . CBC    [place]  . Comprehensive metabolic panel    [place]    Order Specific Question:  Has the patient fasted?    Answer:  No  . Lipid panel    [place]    Order Specific Question:  Has the patient fasted?    Answer:  No  . PSA  . Hepatitis C antibody, reflex    [facility]  . Uric Acid

## 2016-01-24 NOTE — Progress Notes (Signed)
Pre visit review using our clinic review tool, if applicable. No additional management support is needed unless otherwise documented below in the visit note. 

## 2016-01-27 ENCOUNTER — Other Ambulatory Visit: Payer: Self-pay

## 2016-01-27 ENCOUNTER — Telehealth: Payer: Self-pay | Admitting: Family Medicine

## 2016-01-27 ENCOUNTER — Other Ambulatory Visit: Payer: Self-pay | Admitting: Family Medicine

## 2016-01-27 DIAGNOSIS — R972 Elevated prostate specific antigen [PSA]: Secondary | ICD-10-CM

## 2016-01-27 DIAGNOSIS — M25569 Pain in unspecified knee: Secondary | ICD-10-CM

## 2016-01-27 LAB — URIC ACID: URIC ACID, SERUM: 5.1 mg/dL (ref 4.0–7.8)

## 2016-01-27 MED ORDER — ATORVASTATIN CALCIUM 20 MG PO TABS: 20.0000 mg | ORAL_TABLET | ORAL | Status: DC

## 2016-01-27 NOTE — Telephone Encounter (Signed)
Ok to refer.

## 2016-01-27 NOTE — Telephone Encounter (Signed)
Referral placed.

## 2016-01-27 NOTE — Telephone Encounter (Signed)
Pt states his knee is still bothering him and he would like referral to an orthopedic.

## 2016-01-27 NOTE — Telephone Encounter (Signed)
Yes thanks 

## 2016-01-28 DIAGNOSIS — M1711 Unilateral primary osteoarthritis, right knee: Secondary | ICD-10-CM | POA: Diagnosis not present

## 2016-01-28 DIAGNOSIS — M25561 Pain in right knee: Secondary | ICD-10-CM | POA: Diagnosis not present

## 2016-01-28 DIAGNOSIS — M25461 Effusion, right knee: Secondary | ICD-10-CM | POA: Diagnosis not present

## 2016-02-04 ENCOUNTER — Ambulatory Visit (INDEPENDENT_AMBULATORY_CARE_PROVIDER_SITE_OTHER): Payer: Medicare Other | Admitting: Family Medicine

## 2016-02-04 DIAGNOSIS — E538 Deficiency of other specified B group vitamins: Secondary | ICD-10-CM

## 2016-02-04 MED ORDER — CYANOCOBALAMIN 1000 MCG/ML IJ SOLN
1000.0000 ug | Freq: Once | INTRAMUSCULAR | Status: AC
  Administered 2016-02-04: 1000 ug via INTRAMUSCULAR

## 2016-02-21 ENCOUNTER — Other Ambulatory Visit: Payer: Self-pay | Admitting: Family Medicine

## 2016-02-21 NOTE — Telephone Encounter (Signed)
Refill ok? 

## 2016-02-21 NOTE — Telephone Encounter (Signed)
Yes thanks 

## 2016-03-05 ENCOUNTER — Ambulatory Visit (INDEPENDENT_AMBULATORY_CARE_PROVIDER_SITE_OTHER): Payer: Medicare Other | Admitting: Family Medicine

## 2016-03-05 DIAGNOSIS — E538 Deficiency of other specified B group vitamins: Secondary | ICD-10-CM | POA: Diagnosis not present

## 2016-03-05 MED ORDER — CYANOCOBALAMIN 1000 MCG/ML IJ SOLN
1000.0000 ug | Freq: Once | INTRAMUSCULAR | Status: AC
  Administered 2016-03-05: 1000 ug via INTRAMUSCULAR

## 2016-03-10 DIAGNOSIS — R351 Nocturia: Secondary | ICD-10-CM | POA: Diagnosis not present

## 2016-03-10 DIAGNOSIS — Z0000 Encounter for general adult medical examination without abnormal findings: Secondary | ICD-10-CM | POA: Diagnosis not present

## 2016-03-10 DIAGNOSIS — R972 Elevated prostate specific antigen [PSA]: Secondary | ICD-10-CM | POA: Diagnosis not present

## 2016-04-06 ENCOUNTER — Ambulatory Visit (INDEPENDENT_AMBULATORY_CARE_PROVIDER_SITE_OTHER): Payer: Medicare Other | Admitting: Family Medicine

## 2016-04-06 DIAGNOSIS — E538 Deficiency of other specified B group vitamins: Secondary | ICD-10-CM | POA: Diagnosis not present

## 2016-04-06 MED ORDER — CYANOCOBALAMIN 1000 MCG/ML IJ SOLN
1000.0000 ug | INTRAMUSCULAR | Status: DC
  Administered 2016-04-06: 1000 ug via INTRAMUSCULAR

## 2016-05-07 ENCOUNTER — Ambulatory Visit: Payer: Medicare Other | Admitting: Family Medicine

## 2016-05-07 ENCOUNTER — Ambulatory Visit (INDEPENDENT_AMBULATORY_CARE_PROVIDER_SITE_OTHER): Payer: Medicare Other | Admitting: *Deleted

## 2016-05-07 DIAGNOSIS — E538 Deficiency of other specified B group vitamins: Secondary | ICD-10-CM | POA: Diagnosis not present

## 2016-05-07 MED ORDER — CYANOCOBALAMIN 1000 MCG/ML IJ SOLN
1000.0000 ug | Freq: Once | INTRAMUSCULAR | Status: AC
  Administered 2016-05-07: 1000 ug via INTRAMUSCULAR

## 2016-05-29 ENCOUNTER — Other Ambulatory Visit: Payer: Self-pay

## 2016-05-29 ENCOUNTER — Telehealth: Payer: Self-pay | Admitting: Family Medicine

## 2016-05-29 ENCOUNTER — Other Ambulatory Visit: Payer: Self-pay | Admitting: Family Medicine

## 2016-05-29 MED ORDER — TRAMADOL HCL 50 MG PO TABS
100.0000 mg | ORAL_TABLET | Freq: Two times a day (BID) | ORAL | 2 refills | Status: DC
Start: 2016-05-29 — End: 2016-08-30

## 2016-05-29 NOTE — Telephone Encounter (Signed)
Pt need new Rx for tramdol    Pharm:  [facility]

## 2016-05-29 NOTE — Telephone Encounter (Signed)
Prescription printed and faxed to the pharmacy 

## 2016-06-01 NOTE — Telephone Encounter (Signed)
Pt is requesting a refill. Last filled on 12/03/2014, last OV 01/24/16. Ok to refill?

## 2016-06-02 NOTE — Telephone Encounter (Signed)
Yes thanks 

## 2016-06-08 ENCOUNTER — Ambulatory Visit (INDEPENDENT_AMBULATORY_CARE_PROVIDER_SITE_OTHER): Payer: Medicare Other | Admitting: Family Medicine

## 2016-06-08 DIAGNOSIS — E538 Deficiency of other specified B group vitamins: Secondary | ICD-10-CM

## 2016-06-08 MED ORDER — CYANOCOBALAMIN 1000 MCG/ML IJ SOLN
1000.0000 ug | Freq: Once | INTRAMUSCULAR | Status: AC
  Administered 2016-06-08: 1000 ug via INTRAMUSCULAR

## 2016-06-11 ENCOUNTER — Other Ambulatory Visit: Payer: Self-pay | Admitting: Family Medicine

## 2016-06-11 DIAGNOSIS — R972 Elevated prostate specific antigen [PSA]: Secondary | ICD-10-CM | POA: Diagnosis not present

## 2016-06-16 DIAGNOSIS — H25812 Combined forms of age-related cataract, left eye: Secondary | ICD-10-CM | POA: Diagnosis not present

## 2016-06-18 DIAGNOSIS — R972 Elevated prostate specific antigen [PSA]: Secondary | ICD-10-CM | POA: Diagnosis not present

## 2016-07-07 ENCOUNTER — Ambulatory Visit (INDEPENDENT_AMBULATORY_CARE_PROVIDER_SITE_OTHER): Payer: Medicare Other | Admitting: Family Medicine

## 2016-07-07 DIAGNOSIS — E538 Deficiency of other specified B group vitamins: Secondary | ICD-10-CM

## 2016-07-07 MED ORDER — CYANOCOBALAMIN 1000 MCG/ML IJ SOLN
1000.0000 ug | Freq: Once | INTRAMUSCULAR | Status: AC
  Administered 2016-07-07: 1000 ug via INTRAMUSCULAR

## 2016-07-09 ENCOUNTER — Other Ambulatory Visit: Payer: Self-pay | Admitting: Family Medicine

## 2016-07-10 ENCOUNTER — Other Ambulatory Visit: Payer: Self-pay | Admitting: Family Medicine

## 2016-07-13 ENCOUNTER — Other Ambulatory Visit: Payer: Self-pay | Admitting: Family Medicine

## 2016-07-14 ENCOUNTER — Other Ambulatory Visit: Payer: Self-pay

## 2016-07-14 MED ORDER — TRIAMTERENE-HCTZ 37.5-25 MG PO TABS: 1.0000 | ORAL_TABLET | Freq: Every day | ORAL | 3 refills | Status: DC

## 2016-07-19 ENCOUNTER — Other Ambulatory Visit: Payer: Self-pay | Admitting: Family Medicine

## 2016-08-06 ENCOUNTER — Ambulatory Visit (INDEPENDENT_AMBULATORY_CARE_PROVIDER_SITE_OTHER): Payer: Medicare Other | Admitting: Family Medicine

## 2016-08-06 DIAGNOSIS — E538 Deficiency of other specified B group vitamins: Secondary | ICD-10-CM

## 2016-08-06 MED ORDER — CYANOCOBALAMIN 1000 MCG/ML IJ SOLN
1000.0000 ug | Freq: Once | INTRAMUSCULAR | Status: AC
  Administered 2016-08-06: 1000 ug via INTRAMUSCULAR

## 2016-08-30 ENCOUNTER — Other Ambulatory Visit: Payer: Self-pay | Admitting: Family Medicine

## 2016-09-01 NOTE — Telephone Encounter (Signed)
Yes thanks 

## 2016-09-02 ENCOUNTER — Other Ambulatory Visit: Payer: Self-pay | Admitting: Family Medicine

## 2016-09-07 ENCOUNTER — Ambulatory Visit (INDEPENDENT_AMBULATORY_CARE_PROVIDER_SITE_OTHER): Payer: Medicare Other | Admitting: Family Medicine

## 2016-09-07 DIAGNOSIS — E538 Deficiency of other specified B group vitamins: Secondary | ICD-10-CM | POA: Diagnosis not present

## 2016-09-07 MED ORDER — CYANOCOBALAMIN 1000 MCG/ML IJ SOLN
1000.0000 ug | Freq: Once | INTRAMUSCULAR | Status: AC
  Administered 2016-09-07: 1000 ug via INTRAMUSCULAR

## 2016-09-07 NOTE — Progress Notes (Signed)
B 12

## 2016-10-07 ENCOUNTER — Ambulatory Visit: Payer: Medicare Other

## 2016-10-08 ENCOUNTER — Ambulatory Visit: Payer: Medicare Other

## 2016-10-09 ENCOUNTER — Ambulatory Visit (INDEPENDENT_AMBULATORY_CARE_PROVIDER_SITE_OTHER): Payer: Medicare Other

## 2016-10-09 DIAGNOSIS — E538 Deficiency of other specified B group vitamins: Secondary | ICD-10-CM

## 2016-10-09 MED ORDER — CYANOCOBALAMIN 1000 MCG/ML IJ SOLN
1000.0000 ug | Freq: Once | INTRAMUSCULAR | Status: AC
  Administered 2016-10-09: 1000 ug via INTRAMUSCULAR

## 2016-11-09 ENCOUNTER — Ambulatory Visit (INDEPENDENT_AMBULATORY_CARE_PROVIDER_SITE_OTHER): Payer: Medicare Other

## 2016-11-09 DIAGNOSIS — E538 Deficiency of other specified B group vitamins: Secondary | ICD-10-CM

## 2016-11-09 MED ORDER — CYANOCOBALAMIN 1000 MCG/ML IJ SOLN
1000.0000 ug | Freq: Once | INTRAMUSCULAR | Status: AC
  Administered 2016-11-09: 1000 ug via INTRAMUSCULAR

## 2016-11-09 NOTE — Progress Notes (Signed)
Patient had is B12 injection for his vit B 12 deficiency on 11/09/2017 at 11.55am. Given by Rosealee Albee CMA

## 2016-12-03 ENCOUNTER — Other Ambulatory Visit: Payer: Self-pay | Admitting: Family Medicine

## 2016-12-07 ENCOUNTER — Other Ambulatory Visit: Payer: Self-pay | Admitting: Family Medicine

## 2016-12-08 ENCOUNTER — Ambulatory Visit (INDEPENDENT_AMBULATORY_CARE_PROVIDER_SITE_OTHER): Payer: Medicare Other | Admitting: Family Medicine

## 2016-12-08 DIAGNOSIS — E538 Deficiency of other specified B group vitamins: Secondary | ICD-10-CM

## 2016-12-08 MED ORDER — CYANOCOBALAMIN 1000 MCG/ML IJ SOLN
1000.0000 ug | Freq: Once | INTRAMUSCULAR | Status: AC
  Administered 2016-12-08: 1000 ug via INTRAMUSCULAR

## 2016-12-08 NOTE — Progress Notes (Signed)
Here for monthly b12 injection for low b12  Scott [name]

## 2016-12-14 ENCOUNTER — Other Ambulatory Visit: Payer: Self-pay | Admitting: Family Medicine

## 2016-12-14 DIAGNOSIS — J309 Allergic rhinitis, unspecified: Secondary | ICD-10-CM

## 2016-12-18 DIAGNOSIS — K5904 Chronic idiopathic constipation: Secondary | ICD-10-CM | POA: Diagnosis not present

## 2016-12-18 DIAGNOSIS — Z86010 Personal history of colonic polyps: Secondary | ICD-10-CM | POA: Diagnosis not present

## 2017-01-05 ENCOUNTER — Other Ambulatory Visit: Payer: Self-pay | Admitting: Family Medicine

## 2017-01-05 ENCOUNTER — Ambulatory Visit (INDEPENDENT_AMBULATORY_CARE_PROVIDER_SITE_OTHER): Payer: Medicare Other

## 2017-01-05 DIAGNOSIS — E538 Deficiency of other specified B group vitamins: Secondary | ICD-10-CM | POA: Diagnosis not present

## 2017-01-05 MED ORDER — CYANOCOBALAMIN 1000 MCG/ML IJ SOLN
1000.0000 ug | Freq: Once | INTRAMUSCULAR | Status: AC
  Administered 2017-01-05: 1000 ug via INTRAMUSCULAR

## 2017-01-05 NOTE — Progress Notes (Addendum)
Patient got his B12 injection for his Vit B 12 deficiency on 01/05/2017 at 11.30am. Given by Rosealee Albee CMA.  I have reviewed and agree with note, evaluation, plan. Need b12 level next labs  Garret Reddish, MD

## 2017-01-28 DIAGNOSIS — Z1211 Encounter for screening for malignant neoplasm of colon: Secondary | ICD-10-CM | POA: Diagnosis not present

## 2017-01-28 DIAGNOSIS — K529 Noninfective gastroenteritis and colitis, unspecified: Secondary | ICD-10-CM | POA: Diagnosis not present

## 2017-01-28 DIAGNOSIS — K5790 Diverticulosis of intestine, part unspecified, without perforation or abscess without bleeding: Secondary | ICD-10-CM | POA: Diagnosis not present

## 2017-01-28 DIAGNOSIS — K5730 Diverticulosis of large intestine without perforation or abscess without bleeding: Secondary | ICD-10-CM | POA: Diagnosis not present

## 2017-01-28 DIAGNOSIS — K648 Other hemorrhoids: Secondary | ICD-10-CM | POA: Diagnosis not present

## 2017-01-28 DIAGNOSIS — Z86010 Personal history of colonic polyps: Secondary | ICD-10-CM | POA: Diagnosis not present

## 2017-01-28 LAB — HM COLONOSCOPY

## 2017-02-01 ENCOUNTER — Encounter: Payer: Self-pay | Admitting: Family Medicine

## 2017-02-05 ENCOUNTER — Ambulatory Visit (INDEPENDENT_AMBULATORY_CARE_PROVIDER_SITE_OTHER): Payer: Medicare Other | Admitting: Family Medicine

## 2017-02-05 DIAGNOSIS — E538 Deficiency of other specified B group vitamins: Secondary | ICD-10-CM | POA: Diagnosis not present

## 2017-02-05 MED ORDER — CYANOCOBALAMIN 1000 MCG/ML IJ SOLN
1000.0000 ug | Freq: Once | INTRAMUSCULAR | Status: AC
  Administered 2017-02-05: 1000 ug via INTRAMUSCULAR

## 2017-02-05 NOTE — Progress Notes (Signed)
Monthly b12 injection given. History of deficiency Lab Results  Component Value Date   [id number] 10/10/2014  last b12 was ok- will need repeat next visit  Garret Reddish

## 2017-03-05 ENCOUNTER — Ambulatory Visit (INDEPENDENT_AMBULATORY_CARE_PROVIDER_SITE_OTHER): Payer: Medicare Other

## 2017-03-05 DIAGNOSIS — E538 Deficiency of other specified B group vitamins: Secondary | ICD-10-CM | POA: Diagnosis not present

## 2017-03-05 MED ORDER — CYANOCOBALAMIN 1000 MCG/ML IJ SOLN
1000.0000 ug | Freq: Once | INTRAMUSCULAR | Status: AC
  Administered 2017-03-05: 1000 ug via INTRAMUSCULAR

## 2017-03-05 NOTE — Progress Notes (Signed)
Patient in office today for Vit B12 injection. Patient tolerated well.

## 2017-03-14 ENCOUNTER — Other Ambulatory Visit: Payer: Self-pay | Admitting: Family Medicine

## 2017-03-30 ENCOUNTER — Encounter: Payer: Self-pay | Admitting: Family Medicine

## 2017-03-30 ENCOUNTER — Ambulatory Visit (INDEPENDENT_AMBULATORY_CARE_PROVIDER_SITE_OTHER): Payer: Medicare Other | Admitting: Family Medicine

## 2017-03-30 VITALS — BP 118/60 | HR 64 | Temp 98.4°F | Ht 70.0 in | Wt 176.6 lb

## 2017-03-30 DIAGNOSIS — R972 Elevated prostate specific antigen [PSA]: Secondary | ICD-10-CM | POA: Diagnosis not present

## 2017-03-30 DIAGNOSIS — E785 Hyperlipidemia, unspecified: Secondary | ICD-10-CM

## 2017-03-30 DIAGNOSIS — M1A09X0 Idiopathic chronic gout, multiple sites, without tophus (tophi): Secondary | ICD-10-CM | POA: Diagnosis not present

## 2017-03-30 DIAGNOSIS — E538 Deficiency of other specified B group vitamins: Secondary | ICD-10-CM

## 2017-03-30 DIAGNOSIS — Z0000 Encounter for general adult medical examination without abnormal findings: Secondary | ICD-10-CM | POA: Diagnosis not present

## 2017-03-30 DIAGNOSIS — I10 Essential (primary) hypertension: Secondary | ICD-10-CM

## 2017-03-30 DIAGNOSIS — K219 Gastro-esophageal reflux disease without esophagitis: Secondary | ICD-10-CM

## 2017-03-30 LAB — VITAMIN B12: Vitamin B-12: 698 pg/mL (ref 211–911)

## 2017-03-30 LAB — COMPREHENSIVE METABOLIC PANEL
ALBUMIN: 4.5 g/dL (ref 3.5–5.2)
ALT: 19 U/L (ref 0–53)
AST: 24 U/L (ref 0–37)
Alkaline Phosphatase: 63 U/L (ref 39–117)
BUN: 14 mg/dL (ref 6–23)
CALCIUM: 9.4 mg/dL (ref 8.4–10.5)
CHLORIDE: 97 meq/L (ref 96–112)
CO2: 31 mEq/L (ref 19–32)
Creatinine, Ser: 1.3 mg/dL (ref 0.40–1.50)
GFR: 69.54 mL/min (ref >60.00)
Glucose, Bld: 91 mg/dL (ref 70–99)
POTASSIUM: 3.9 meq/L (ref 3.5–5.1)
SODIUM: 133 meq/L — AB (ref 135–145)
Total Bilirubin: 0.6 mg/dL (ref 0.2–1.2)
Total Protein: 7.4 g/dL (ref 6.0–8.3)

## 2017-03-30 LAB — CBC
HEMATOCRIT: 38 % — AB (ref 39.0–52.0)
Hemoglobin: 12.6 g/dL — ABNORMAL LOW (ref 13.0–17.0)
MCHC: 33.2 g/dL (ref 30.0–36.0)
MCV: 85.4 fl (ref 78.0–100.0)
Platelets: 220 10*3/uL (ref 150.0–400.0)
RBC: 4.44 Mil/uL (ref 4.22–5.81)
RDW: 15.8 % — ABNORMAL HIGH (ref 11.5–15.5)
WBC: 4.4 10*3/uL (ref 4.0–10.5)

## 2017-03-30 LAB — LIPID PANEL
CHOLESTEROL: 203 mg/dL — AB (ref 0–200)
HDL: 43.3 mg/dL (ref >39.00)
LDL CALC: 137 mg/dL — AB (ref 0–99)
NonHDL: 159.54
TRIGLYCERIDES: 113 mg/dL (ref 0.0–149.0)
Total CHOL/HDL Ratio: 5
VLDL: 22.6 mg/dL (ref 0.0–40.0)

## 2017-03-30 LAB — PSA: PSA: 3.52 ng/mL (ref 0.10–4.00)

## 2017-03-30 LAB — URIC ACID: URIC ACID, SERUM: 4.8 mg/dL (ref 4.0–7.8)

## 2017-03-30 NOTE — Assessment & Plan Note (Signed)
Hyperlipidemia- statin intolerant in past. trialing atorvastatin 20mg . He stopped taking this because he didn't think it would be effective. Agrees to restart

## 2017-03-30 NOTE — Progress Notes (Signed)
Phone: [phone]  Subjective:  Patient presents today for their annual physical. Chief complaint-noted.   See problem oriented charting- ROS- full  review of systems was completed and negative including No chest pain or shortness of breath. No headache or blurry vision.   The following were reviewed and entered/updated in epic: Past Medical History:  Diagnosis Date  . ANEMIA, OTHER UNSPEC 08/02/2009  . B12 DEFICIENCY 07/07/2007  . Bell's palsy     around 2005  . BPPV (benign paroxysmal positional vertigo)    2003  . GERD 07/13/2007  . Gout, unspecified 06/17/2010  . HYPERLIPIDEMIA 07/13/2007  . HYPERTENSION 07/13/2007  . RENAL INSUFFICIENCY 06/04/2010   Patient Active Problem List   Diagnosis Date Noted  . Gout 06/17/2010    Priority: Medium  . 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 Hyperlipidemia 07/13/2007    Priority: Medium  . Essential hypertension 07/13/2007    Priority: Medium  . GERD 07/13/2007    Priority: Medium  . Low back pain 10/22/2014    Priority: Low  . Rotator cuff tendinitis 06/04/2014    Priority: Low  . Anemia 08/02/2009    Priority: Low  . B12 deficiency 07/07/2007    Priority: Low   Past Surgical History:  Procedure Laterality Date  . BACK SURGERY  07/13/07    Family History  Problem Relation Age of Onset  . Arthritis Father     Medications- reviewed and updated Current Outpatient Prescriptions  Medication Sig Dispense Refill  . allopurinol (ZYLOPRIM) 300 MG tablet TAKE 1 TABLET BY MOUTH ONCE A DAY 90 tablet 3  . atorvastatin (LIPITOR) 20 MG tablet Take 1 tablet (20 mg total) by mouth once a week. 13 tablet 3  . celecoxib (CELEBREX) 100 MG capsule TAKE 1 CAPSULE (100 MG TOTAL) BY MOUTH 2 (TWO) TIMES DAILY AS NEEDED FOR MODERATE PAIN (SHOULDER). 60 capsule 5  . colchicine 0.6 MG tablet TAKE 1 TABLET BY MOUTH DAILY 30 tablet 4  . cyanocobalamin 1000 MCG tablet Inject 100 mcg as directed every 30 (thirty)  days.     . fluticasone (FLONASE) 50 MCG/ACT nasal spray PLACE 2 SPRAYS INTO BOTH NOSTRILS DAILY. 16 g 1  . losartan (COZAAR) 50 MG tablet TAKE 1 TABLET BY MOUTH EVERY DAY 90 tablet 3  . Polyethylene Glycol 3350 POWD Take 17 g by mouth daily.      . RABEprazole (ACIPHEX) 20 MG tablet Take 20 mg by mouth daily.      . traMADol (ULTRAM) 50 MG tablet TAKE 2 TABLETS BY MOUTH TWICE A DAY 120 tablet 2  . triamterene-hydrochlorothiazide (MAXZIDE-25) 37.5-25 MG tablet Take 1 tablet by mouth daily. 90 tablet 3   No current facility-administered medications for this visit.     Allergies-reviewed and updated Allergies  Allergen Reactions  . Nsaids     REACTION: hx of renal insufficiency  . Statins     myalgias    Social History   Social History  . Marital status: Married    Spouse name: N/A  . Number of children: N/A  . Years of education: N/A   Social History Main Topics  . Smoking status: Never Smoker  . Smokeless tobacco: Never Used  . Alcohol use No  . Drug use: No  . Sexual activity: Not Asked   Other Topics Concern  . None   Social History Narrative   Married 1965. 4 children. 11 grandchildren. 1 greatgrandchild on way in 2015.  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 Oropharynx normal Neck: no thyromegaly CV: RRR no murmurs rubs or gallops Lungs: CTAB no crackles, wheeze, rhonchi Abdomen: soft/nontender/nondistended/normal bowel sounds. No rebound or guarding.  Ext: no edema Skin: warm, dry Neuro: grossly normal, moves all extremities, PERRLA Rectal: normal tone, diffusely enlarged prostate, no masses or tenderness  Assessment/Plan:  71 y.o. male presenting  for annual physical.  Health Maintenance counseling: 1. Anticipatory guidance: Patient counseled regarding regular dental exams q6 months, eye exams -yearly, wearing seatbelts.  2. Risk factor reduction:  Advised patient of need for regular exercise and diet rich and fruits and vegetables to reduce risk of heart attack and stroke. Exercise- stays active- advised regular exercise. Diet-weight reasonable- reasonable diet.  Wt Readings from Last 3 Encounters:  03/30/17 176 lb 9.6 oz (80.1 kg)  01/24/16 176 lb (79.8 kg)  01/16/16 180 lb (81.6 kg)  3. Immunizations/screenings/ancillary studies- declines shingrix this year, consider again next year Immunization History  Administered Date(s) Administered  . Pneumococcal Conjugate-13 01/30/2014  . Pneumococcal Polysaccharide-23 06/16/2013  . Td 01/18/2009  4. Prostate cancer screening-  BPH on exam last year but with PSA above 4 referred to urology for monitoring. 06/11/16 PSA did go down again to 3.88. 06/18/16 advised 6 month follow up - he states he will call for this. Will update psa today. Exam stable with BPH today Lab Results  Component Value Date   PSA 4.16 (H) 01/24/2016   PSA 3.77 03/01/2014   PSA 4.11 (H) 01/23/2014   5. Colon cancer screening - 01/28/17 with Dr. Earlean Shawl. Repeat 5 years with adenoma history.   Status of chronic or acute concerns   For shoulder pain- using sparing celebrex  Hyperlipidemia Hyperlipidemia- statin intolerant in past. trialing atorvastatin 20mg . He stopped taking this because he didn't think it would be effective. Agrees to restart  Gout will update uric acid. Last flare a year ago- usually has a colchicine when eats steak. Will check uric acid. Taking allopurinol 300mg . Could stop hctz  GERD  on aciphex rarely- controlled for most part with just tums   Essential hypertension controlled on maxzide 25 and losartan 50. Could stop hctz if gout issues  B12 deficiency b12 deficiency- update b12 today,  getting monthly injections. Has been on this since 2012 at least- no plans to change  Return in about 6 months (around 09/30/2017) for follow up- or sooner if needed.  Orders Placed This Encounter  Procedures  . CBC    [place]  . Comprehensive metabolic panel    [place]    Order Specific Question:   Has the patient fasted?    Answer:   No  . PSA  . Lipid panel        Order Specific Question:   Has the patient fasted?    Answer:   No  . Vitamin B12  . Uric Acid   Return precautions advised.  Garret Reddish, MD

## 2017-03-30 NOTE — Assessment & Plan Note (Signed)
will update uric acid. Last flare a year ago- usually has a colchicine when eats steak. Will check uric acid. Taking allopurinol 300mg . Could stop hctz

## 2017-03-30 NOTE — Assessment & Plan Note (Signed)
on aciphex rarely- controlled for most part with just tums

## 2017-03-30 NOTE — Assessment & Plan Note (Signed)
controlled on maxzide 25 and losartan 50. Could stop hctz if gout issues

## 2017-03-30 NOTE — Assessment & Plan Note (Addendum)
b12 deficiency- update b12 today, getting monthly injections. Has been on this since 2012 at least- no plans to change

## 2017-03-30 NOTE — Patient Instructions (Addendum)
Restart atorvastatin 20mg  once a week.   I would also like for you to sign up for an annual wellness visit with our nurse, Manuela Schwartz, who specializes in the annual wellness exam. This is a free benefit under medicare that may help [place] find additional ways to help you. Some highlights are reviewing medications, lifestyle, and doing a dementia screen.   ______________________________________________________________________  Starting October 1st 2018, I will be transferring to our new location: [facility] (corner of [address] and [address] from [employer]) [place], [place] Phone: [phone]  I would love to have you remain my patient at this new location as long as it remains convenient for you. I am excited about the opportunity to have x-ray and sports medicine in the new building but will really miss the awesome staff and physicians at [place]. Continue to schedule appointments at [facility] and we will automatically transfer them to the [place] location starting October 1st.

## 2017-03-31 ENCOUNTER — Other Ambulatory Visit: Payer: Self-pay

## 2017-03-31 DIAGNOSIS — D649 Anemia, unspecified: Secondary | ICD-10-CM

## 2017-04-06 ENCOUNTER — Ambulatory Visit: Payer: Medicare Other

## 2017-05-07 ENCOUNTER — Ambulatory Visit (INDEPENDENT_AMBULATORY_CARE_PROVIDER_SITE_OTHER): Payer: Medicare Other | Admitting: Family Medicine

## 2017-05-07 ENCOUNTER — Encounter: Payer: Self-pay | Admitting: Family Medicine

## 2017-05-07 VITALS — BP 120/80 | HR 62 | Temp 98.3°F | Wt 178.5 lb

## 2017-05-07 DIAGNOSIS — S0990XA Unspecified injury of head, initial encounter: Secondary | ICD-10-CM

## 2017-05-07 DIAGNOSIS — E538 Deficiency of other specified B group vitamins: Secondary | ICD-10-CM | POA: Diagnosis not present

## 2017-05-07 MED ORDER — CYANOCOBALAMIN 1000 MCG/ML IJ SOLN
1000.0000 ug | Freq: Once | INTRAMUSCULAR | Status: AC
  Administered 2017-05-07: 1000 ug via INTRAMUSCULAR

## 2017-05-07 NOTE — Patient Instructions (Signed)
Head Injury, Adult  There are many types of head injuries. Head injuries can be as minor as a bump, or they can be more severe. More severe head injuries include:   A jarring injury to the brain (concussion).   A bruise of the brain (contusion). This means there is bleeding in the brain that can cause swelling.   A cracked skull (skull fracture).   Bleeding in the brain that collects, clots, and forms a bump (hematoma).    After a head injury, you may need to be observed for a while in the emergency department or urgent care. Sometimes admission to the hospital is needed.  After a head injury has happened, most problems occur within the first 24 hours, but side effects may occur up to 7-10 days after the injury. It is important to watch your condition for any changes.  What are the causes?  There are many possible causes of a head injury. A serious head injury may happen to someone who is in a car accident (motor vehicle collision). Other causes of major head injuries include bicycle or motorcycle accidents, sports injuries, and falls.  Risk factors  This condition is more likely to occur in people who:   Drink a lot of alcohol or use drugs.   Are over the age of 65.   Are at risk for falls.    What are the symptoms?  There are many possible symptoms of a head injury. Visible symptoms of a head injury include a bruise, bump, or bleeding at the site of the injury. Other non-visible symptoms include:   Feeling sleepy or not being able to stay awake.   Passing out.   Headache.   Seizures.   Dizziness.   Confusion.   Memory problems.   Nausea or vomiting.    Other possible symptoms that may develop after the head injury include:   Poor attention and concentration.   Fatigue or tiring easily.   Irritability.   Being uncomfortable around bright lights or loud noises.   Anxiety or depression.   Disturbed sleep.    How is this diagnosed?  This condition can usually be diagnosed based on your symptoms, a  description of the injury, and a physical exam. You may also have imaging tests done, such as a CT scan or MRI. You will also be closely watched.  How is this treated?  Treatment for this condition depends on the severity and type of injury you have. The main goal of treatment is to prevent complications and allow the brain time to heal.  For mild head injury, you may be sent home and treatment may include:   Observation. A responsible adult should stay with you for 24 hours after your injury and check on you often.   Physical rest.   Brain rest.   Pain medicines.    For severe brain injury, treatment may include:   Close observation. This includes hospitalization with frequent physical exams. You may need to go to a hospital that specializes in head injury.   Pain medicines.   Breathing support. This may include using a ventilator.   Managing the pressure inside the brain (intracranial pressure, or ICP). This may include:  ? Monitoring the ICP.  ? Giving medicines to decrease the ICP.  ? Positioning you to decrease the ICP.   Medicine to prevent seizures.   Surgery to stop bleeding or to remove blood clots (craniotomy).   Surgery to remove part of the skull (decompressive   craniectomy). This allows room for the brain to swell.    Follow these instructions at home:  Activity   Rest as much as possible and avoid activities that are physically hard or tiring.   Make sure you get enough sleep.   Limit activities that require a lot of thought or attention, such as:  ? Watching TV.  ? Playing memory games and puzzles.  ? Job-related work or homework.  ? Working on the computer, social media, and texting.   Avoid activities that could cause another head injury, such as playing sports, until your health care provider approves. Having another head injury, especially before the first one has healed, can be dangerous.   Ask your health care provider when it is safe for you to return to your regular activities,  including work or school. Ask your health care provider for a step-by-step plan for gradually returning to activities.   Ask your health care provider when you can drive, ride a bicycle, or use heavy machinery. Your ability to react may be slower after a brain injury. Never do these activities if you are dizzy.    Lifestyle   Do not drink alcohol until your health care provider approves, and avoid drug use. Alcohol and certain drugs may slow your recovery and can put you at risk of further injury.   If it is harder than usual to remember things, write them down.   If you are easily distracted, try to do one thing at a time.   Talk with family members or close friends when making important decisions.   Tell your friends, family, a trusted colleague, and work manager about your injury, symptoms, and restrictions. Have them watch for any new or worsening problems.    General instructions   Take over-the-counter and prescription medicines only as told by your health care provider.   Have someone stay with you for 24 hours after your head injury. This person should watch you for any changes in your symptoms and be ready to seek medical help, as needed.   Keep all follow-up visits as told by your health care provider. This is important.    Prevention   Work on improving your balance and strength to avoid falls.   Wear a seatbelt when you are in a moving vehicle.   Wear a helmet when riding a bicycle, skiing, or doing any other sport or activity that has a risk of injury.   Drink alcohol only in moderation.   Take safety measures in your home, such as:  ? Removing clutter and tripping hazards from floors and stairways.  ? Using grab bars in bathrooms and handrails by stairs.  ? Placing non-slip mats on floors and in bathtubs.  ? Improving lighting in dim areas.  Get help right away if:   You have:  ? A severe headache that is not helped by medicine.  ? Trouble walking, have weakness in your arms and legs, or  lose your balance.  ? Clear or bloody fluid coming from your nose or ears.  ? Changes in your vision.  ? A seizure.   You vomit.   Your symptoms get worse.   Your speech is slurred.   You pass out.   You are sleepier and have trouble staying awake.   Your pupils change size.  These symptoms may represent a serious problem that is an emergency. Do not wait to see if the symptoms will go away. Get medical help right away.

## 2017-05-07 NOTE — Addendum Note (Signed)
Addended by: Westley Hummer B on: 05/07/2017 01:42 PM   Modules accepted: Orders

## 2017-05-07 NOTE — Progress Notes (Signed)
Subjective:     Patient ID: Scott [name], male   DOB: [birth year], 71 y.o.   MRN: [id number]  HPI Patient seen today as a work and following closed head injury. He states this occurred 2 weeks ago today. He was out mowing and was talking to someone and started backing up and ran into a curb and fell back and hit mid occipital region of his skull against the cemented. There was no loss of consciousness. He denied any headache, nausea, or vomiting afterwards. He was told by someone to "get this checked out ". He basically feels at baseline now. No confusion. No fatigue. No focal weakness. No visual changes.  Denies any significant cervical neck pain  He states he had some minimal swelling initially but none since then. He does not take any anticoagulants. He has hypertension which is been well controlled.  Patient has history of B12 deficiency and requesting injection today.  Past Medical History:  Diagnosis Date  . ANEMIA, OTHER UNSPEC 08/02/2009  . B12 DEFICIENCY 07/07/2007  . Bell's palsy     around 2005  . BPPV (benign paroxysmal positional vertigo)    2003  . GERD 07/13/2007  . Gout, unspecified 06/17/2010  . HYPERLIPIDEMIA 07/13/2007  . HYPERTENSION 07/13/2007  . RENAL INSUFFICIENCY 06/04/2010   Past Surgical History:  Procedure Laterality Date  . BACK SURGERY  07/13/07    reports that he has never smoked. He has never used smokeless tobacco. He reports that he does not drink alcohol or use drugs. family history includes Arthritis in his father. Allergies  Allergen Reactions  . Nsaids     REACTION: hx of renal insufficiency  . Statins     myalgias     Review of Systems  Constitutional: Negative for appetite change and unexpected weight change.  Respiratory: Negative for shortness of breath.   Cardiovascular: Negative for chest pain.  Gastrointestinal: Negative for abdominal pain, nausea and vomiting.  Musculoskeletal: Negative for neck pain.  Neurological: Negative for  dizziness, seizures, syncope, speech difficulty, weakness and headaches.  Psychiatric/Behavioral: Negative for confusion.       Objective:   Physical Exam  Constitutional: He is oriented to person, place, and time. He appears well-developed and well-nourished.  HENT:  Right Ear: External ear normal.  Left Ear: External ear normal.  Head is atraumatic  Eyes: Pupils are equal, round, and reactive to light.  Neck: Neck supple.  Cardiovascular: Normal rate and regular rhythm.   Pulmonary/Chest: Effort normal and breath sounds normal. No respiratory distress. He has no wheezes. He has no rales.  Neurological: He is alert and oriented to person, place, and time. No cranial nerve deficit. Coordination normal.  No focal strength deficits. Cerebellar function normal. Gait normal.       Assessment:     #1 closed head injury following recent fall-2 weeks ago. No loss of consciousness. He has not had any concussion type symptoms and basically asymptomatic. Nonfocal neuro exam  #2 history of B12 deficiency    Plan:     -No indication for CT scanning at this time -Handout given on head injury with things to watch for.  -B12 injection given -Follow-up with primary for any headaches or other new symptoms  Eulas Post MD [facility]

## 2017-05-13 ENCOUNTER — Telehealth: Payer: Self-pay | Admitting: Family Medicine

## 2017-05-13 NOTE — Telephone Encounter (Signed)
Patient fell "about 3 weeks ago" and hit the back of his after tripping on a curb.  States yesterday he had a headache with blurred that lasted about 15 minutes.  Patient is concerned if there needs to be a CT or MRI done to check his head or if Dr. Yong Channel would like to see him.  Dr. Elease Hashimoto evaluated the patient Friday 05/07/17.

## 2017-05-14 ENCOUNTER — Encounter: Payer: Self-pay | Admitting: Family Medicine

## 2017-05-14 ENCOUNTER — Ambulatory Visit (INDEPENDENT_AMBULATORY_CARE_PROVIDER_SITE_OTHER): Payer: Medicare Other | Admitting: Family Medicine

## 2017-05-14 VITALS — BP 104/66 | HR 59 | Temp 98.1°F | Ht 70.0 in | Wt 180.4 lb

## 2017-05-14 DIAGNOSIS — R51 Headache: Secondary | ICD-10-CM | POA: Diagnosis not present

## 2017-05-14 DIAGNOSIS — R519 Headache, unspecified: Secondary | ICD-10-CM

## 2017-05-14 NOTE — Patient Instructions (Signed)
Things look great today  Let us know if you have recurrent symptoms  Make sure to stay well hydrated with water

## 2017-05-14 NOTE — Telephone Encounter (Signed)
I called and scheduled him for 3:45 this afternoon to see Dr. Yong Channel

## 2017-05-14 NOTE — Progress Notes (Signed)
Subjective:  Scott [name] is a 71 y.o. year old very pleasant male patient who presents for/with See problem oriented charting ROS- No facial or extremity weakness. No slurred words or trouble swallowing. no double vision. No paresthesias. No confusion or word finding difficulties.    Past Medical History-  Patient Active Problem List   Diagnosis Date Noted  . Gout 06/17/2010    Priority: Medium  . 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 Hyperlipidemia 07/13/2007    Priority: Medium  . Essential hypertension 07/13/2007    Priority: Medium  . GERD 07/13/2007    Priority: Medium  . Low back pain 10/22/2014    Priority: Low  . Rotator cuff tendinitis 06/04/2014    Priority: Low  . Anemia 08/02/2009    Priority: Low  . B12 deficiency 07/07/2007    Priority: Low    Medications- reviewed and updated Current Outpatient Prescriptions  Medication Sig Dispense Refill  . allopurinol (ZYLOPRIM) 300 MG tablet TAKE 1 TABLET BY MOUTH ONCE A DAY 90 tablet 3  . atorvastatin (LIPITOR) 20 MG tablet Take 1 tablet (20 mg total) by mouth once a week. 13 tablet 3  . celecoxib (CELEBREX) 100 MG capsule TAKE 1 CAPSULE (100 MG TOTAL) BY MOUTH 2 (TWO) TIMES DAILY AS NEEDED FOR MODERATE PAIN (SHOULDER). 60 capsule 5  . colchicine 0.6 MG tablet TAKE 1 TABLET BY MOUTH DAILY 30 tablet 4  . cyanocobalamin 1000 MCG tablet Inject 100 mcg as directed every 30 (thirty) days.     . fluticasone (FLONASE) 50 MCG/ACT nasal spray PLACE 2 SPRAYS INTO BOTH NOSTRILS DAILY. 16 g 1  . losartan (COZAAR) 50 MG tablet TAKE 1 TABLET BY MOUTH EVERY DAY 90 tablet 3  . Polyethylene Glycol 3350 POWD Take 17 g by mouth daily.      . RABEprazole (ACIPHEX) 20 MG tablet Take 20 mg by mouth daily.      . traMADol (ULTRAM) 50 MG tablet TAKE 2 TABLETS BY MOUTH TWICE A DAY 120 tablet 2  . triamterene-hydrochlorothiazide (MAXZIDE-25) 37.5-25 MG tablet Take 1 tablet by mouth daily. 90 tablet 3   No  current facility-administered medications for this visit.     Objective: BP 104/66 (BP Location: Left Arm, Patient Position: Sitting, Cuff Size: Large)   Pulse (!) 59   Temp 98.1 F (36.7 C) (Oral)   Ht 5\' 10"  (1.778 m)   Wt 180 lb 6.4 oz (81.8 kg)   SpO2 94%   BMI 25.88 kg/m  Gen: NAD, resting comfortably CV: RRR no murmurs rubs or gallops Lungs: CTAB no crackles, wheeze, rhonchi Ext: no edema Skin: warm, dry Neuro: CN II-XII intact, sensation and reflexes normal throughout, 5/5 muscle strength in bilateral upper and lower extremities. Normal finger to nose. Normal rapid alternating movements. No pronator drift. Normal romberg. Normal gait.   Assessment/Plan:  Frontal headache S: patient saw Dr. Elease Hashimoto on 05/07/17 following closed head injury 2 weeks prior- tripped over a curb and hit right to mid occipital region of skull against cement. No LOC. He had no symptoms at that time- other than stable baseline occasional neck pain. No swelling at time of visit. Not on anticoagulants.   He felt well since that time then 2 days ago he developed a frontal headache lasting for about 15 minutes. Mild aching headache. States he was out doing errands in 95 degree weather and had not stayed well hydrated. States eyes felt slightly watery/runny and when  they were more full with fluid his vision was slightly off. He got home and issues seemed to resolve. No recurrence. With prior head injury he was told he needed to get checked out by a few people so he decided to call in.  A/P: 71 year old with closed head injury over 3 weeks ago now. Brief headache 2 days ago when out in the heat and dehydrated. Had reported blurry vision- but states today this was really due to some watering in his eyes which also quickly resolved. Reassuring neurological examination today. Doubt serious pathology such as subdural hematoma. Reassurance provided- encouraged him to stay well hydrated.   Patient Instructions  Things  look great today  Let us know if you have recurrent symptoms  Make sure to stay well hydrated with water  Return precautions advised.  Garret Reddish, MD

## 2017-06-03 ENCOUNTER — Telehealth: Payer: Self-pay | Admitting: Family Medicine

## 2017-06-03 NOTE — Telephone Encounter (Signed)
Pt completed AWV with [employer] nurse.  Pt wife will find out the date and schedule next year's AWV at the office with Cassie.

## 2017-06-08 ENCOUNTER — Ambulatory Visit (INDEPENDENT_AMBULATORY_CARE_PROVIDER_SITE_OTHER): Payer: Medicare Other

## 2017-06-08 DIAGNOSIS — E538 Deficiency of other specified B group vitamins: Secondary | ICD-10-CM

## 2017-06-08 MED ORDER — CYANOCOBALAMIN 1000 MCG/ML IJ SOLN
1000.0000 ug | Freq: Once | INTRAMUSCULAR | Status: AC
  Administered 2017-06-08: 1000 ug via INTRAMUSCULAR

## 2017-06-11 ENCOUNTER — Other Ambulatory Visit: Payer: Self-pay | Admitting: Family Medicine

## 2017-06-16 ENCOUNTER — Other Ambulatory Visit: Payer: Self-pay | Admitting: Family Medicine

## 2017-06-17 NOTE — Telephone Encounter (Signed)
Yes thanks may fill 

## 2017-06-21 ENCOUNTER — Other Ambulatory Visit: Payer: Self-pay | Admitting: Family Medicine

## 2017-07-02 ENCOUNTER — Other Ambulatory Visit: Payer: Self-pay | Admitting: Family Medicine

## 2017-07-08 ENCOUNTER — Ambulatory Visit (INDEPENDENT_AMBULATORY_CARE_PROVIDER_SITE_OTHER): Payer: Medicare Other

## 2017-07-08 DIAGNOSIS — E538 Deficiency of other specified B group vitamins: Secondary | ICD-10-CM | POA: Diagnosis not present

## 2017-07-08 MED ORDER — CYANOCOBALAMIN 1000 MCG/ML IJ SOLN
1000.0000 ug | Freq: Once | INTRAMUSCULAR | Status: AC
  Administered 2017-07-08: 1000 ug via INTRAMUSCULAR

## 2017-07-14 ENCOUNTER — Other Ambulatory Visit: Payer: Self-pay | Admitting: Family Medicine

## 2017-07-14 NOTE — Progress Notes (Signed)
On injections since 2012 for vitamin b12 deficiency  Lab Results  Component Value Date   VITAMINB12 698 03/30/2017  continue monthly injections. Injection given today.

## 2017-08-05 ENCOUNTER — Ambulatory Visit (INDEPENDENT_AMBULATORY_CARE_PROVIDER_SITE_OTHER): Payer: Medicare Other | Admitting: Family Medicine

## 2017-08-05 DIAGNOSIS — E538 Deficiency of other specified B group vitamins: Secondary | ICD-10-CM | POA: Diagnosis not present

## 2017-08-05 MED ORDER — CYANOCOBALAMIN 1000 MCG/ML IJ SOLN
1000.0000 ug | Freq: Once | INTRAMUSCULAR | Status: AC
  Administered 2017-08-05: 1000 ug via INTRAMUSCULAR

## 2017-08-05 NOTE — Progress Notes (Signed)
Monthly b12 shot for low b12 history  Scott [name]

## 2017-09-07 ENCOUNTER — Ambulatory Visit (INDEPENDENT_AMBULATORY_CARE_PROVIDER_SITE_OTHER): Payer: Medicare Other | Admitting: Family Medicine

## 2017-09-07 ENCOUNTER — Encounter: Payer: Self-pay | Admitting: Family Medicine

## 2017-09-07 ENCOUNTER — Ambulatory Visit: Payer: Medicare Other

## 2017-09-07 DIAGNOSIS — E538 Deficiency of other specified B group vitamins: Secondary | ICD-10-CM | POA: Diagnosis not present

## 2017-09-07 MED ORDER — CYANOCOBALAMIN 1000 MCG/ML IJ SOLN
1000.0000 ug | Freq: Once | INTRAMUSCULAR | Status: AC
  Administered 2017-09-07: 1000 ug via INTRAMUSCULAR

## 2017-09-07 NOTE — Progress Notes (Signed)
Patient tolerated B12 injection without difficulty.  Stated he will call to schedule his next B12 injection.   Monthly injection for low b12  I have reviewed and agree with note, evaluation, plan.   Garret Reddish, MD

## 2017-09-17 ENCOUNTER — Ambulatory Visit (INDEPENDENT_AMBULATORY_CARE_PROVIDER_SITE_OTHER): Payer: Medicare Other | Admitting: Family Medicine

## 2017-09-17 ENCOUNTER — Encounter: Payer: Self-pay | Admitting: Family Medicine

## 2017-09-17 ENCOUNTER — Ambulatory Visit (INDEPENDENT_AMBULATORY_CARE_PROVIDER_SITE_OTHER)
Admission: RE | Admit: 2017-09-17 | Discharge: 2017-09-17 | Disposition: A | Discharge status: Home / Self Care | Payer: Medicare Other | Source: Ambulatory Visit | Attending: Family Medicine | Admitting: Family Medicine

## 2017-09-17 VITALS — BP 98/70 | HR 60 | Temp 97.6°F | Ht 69.5 in | Wt 175.3 lb

## 2017-09-17 DIAGNOSIS — M436 Torticollis: Secondary | ICD-10-CM | POA: Diagnosis not present

## 2017-09-17 DIAGNOSIS — M1A09X0 Idiopathic chronic gout, multiple sites, without tophus (tophi): Secondary | ICD-10-CM

## 2017-09-17 DIAGNOSIS — E538 Deficiency of other specified B group vitamins: Secondary | ICD-10-CM

## 2017-09-17 DIAGNOSIS — M542 Cervicalgia: Secondary | ICD-10-CM | POA: Diagnosis not present

## 2017-09-17 DIAGNOSIS — M7581 Other shoulder lesions, right shoulder: Secondary | ICD-10-CM

## 2017-09-17 MED ORDER — PREDNISONE 10 MG PO TABS: ORAL_TABLET | ORAL | 0 refills | Status: DC

## 2017-09-17 NOTE — Patient Instructions (Addendum)
Acute Torticollis, Adult Torticollis is a condition in which the muscles of the neck tighten (contract) abnormally, causing the neck to twist and the head to move into an unnatural position. Torticollis that develops suddenly is called acute torticollis. People with acute torticollis may have trouble turning their head. The condition can be painful and may range from mild to severe. What are the causes? This condition may be caused by:  Sleeping in an awkward position (common).  Extending or twisting the neck muscles beyond their normal position.  An injury to the neck muscles.  An infection.  A tumor.  Certain medicines.  Long-lasting spasms of the neck muscles.  In some cases, the cause may not be known. What increases the risk? You are more likely to develop this condition if:  You have a condition associated with loose ligaments, such as Down syndrome.  You have a brain condition that affects vision, such as strabismus.  What are the signs or symptoms? The main symptom of this condition is tilting of the head to one side. Other symptoms include:  Pain in the neck.  Trouble turning the head from side to side or up and down.  How is this diagnosed? This condition may be diagnosed based on:  A physical exam.  Your medical history.  Imaging tests, such as: ? An X-ray. ? An ultrasound. ? A CT scan. ? An MRI.  How is this treated? Treatment for this condition depends on what is causing the condition. Mild cases may go away without treatment. Treatment for more serious cases may include:  Medicines or shots to relax the muscles.  Other medicines, such as antibiotics to treat the underlying cause.  Wearing a soft neck collar.  Physical therapy and stretching to improve neck strength and flexibility.  Neck massage.  In severe cases, surgery may be needed to repair dislocated or broken bones or to treat nerves in the neck. Follow these instructions at  home:  Take over-the-counter and prescription medicines only as told by your health care provider.  Do stretching exercises and massage your neck as told by your health care provider.  If directed, apply heat to the affected area as often as told by your health care provider. Use the heat source that your health care provider recommends, such as a moist heat pack or a heating pad. ? Place a towel between your skin and the heat source. ? Leave the heat on for 20-30 minutes. ? Remove the heat if your skin turns bright red. This is especially important if you are unable to feel pain, heat, or cold. You may have a greater risk of getting burned.  If you wake up with torticollis after sleeping, check your bed or sleeping area. Look for lumpy pillows or unusual objects. Make sure your bed and sleeping area are comfortable.  Keep all follow-up visits as told by your health care provider. This is important. Contact a health care provider if:  You have a fever.  Your symptoms do not improve or they get worse. Get help right away if:  You have trouble breathing.  You develop noisy breathing (stridor).  You start to drool.  You have trouble swallowing or pain when swallowing.  You develop numbness or weakness in your hands or feet.  You have changes in your speech, understanding, or vision.  You are in severe pain.  You cannot move your head or neck. Summary  Torticollis is a condition in which the muscles of the neck   tighten (contract) abnormally, causing the neck to twist and the head to move into an unnatural position. Torticollis that develops suddenly is called acute torticollis.  Treatment for this condition depends on what is causing the condition. Mild cases may go away without treatment.  Do stretching exercises and massage your neck as told by your health care provider. You may also be instructed to apply heat to the area.  Contact your health care provider if your symptoms  do not improve or they get worse. This information is not intended to replace advice given to you by your health care provider. Make sure you discuss any questions you have with your health care provider. Document Released: 10/16/2000 Document Revised: 12/17/2016 Document Reviewed: 12/17/2016 Elsevier Interactive Patient Education  2018 [employer].  Neck Exercises Neck exercises can be important for many reasons:  They can help you to improve and maintain flexibility in your neck. This can be especially important as you age.  They can help to make your neck stronger. This can make movement easier.  They can reduce or prevent neck pain.  They may help your upper back.  Ask your health care provider which neck exercises would be best for you. Exercises Neck Press Repeat this exercise 10 times. Do it first thing in the morning and right before bed or as told by your health care provider. 1. Lie on your back on a firm bed or on the floor with a pillow under your head. 2. Use your neck muscles to push your head down on the pillow and straighten your spine. 3. Hold the position as well as you can. Keep your head facing up and your chin tucked. 4. Slowly count to 5 while holding this position. 5. Relax for a few seconds. Then repeat.  Isometric Strengthening Do a full set of these exercises 2 times a day or as told by your health care provider. 1. Sit in a supportive chair and place your hand on your forehead. 2. Push forward with your head and neck while pushing back with your hand. Hold for 10 seconds. 3. Relax. Then repeat the exercise 3 times. 4. Next, do thesequence again, this time putting your hand against the back of your head. Use your head and neck to push backward against the hand pressure. 5. Finally, do the same exercise on either side of your head, pushing sideways against the pressure of your hand.  Prone Head Lifts Repeat this exercise 5 times. Do this 2 times a day or  as told by your health care provider. 1. Lie face-down, resting on your elbows so that your chest and upper back are raised. 2. Start with your head facing downward, near your chest. Position your chin either on or near your chest. 3. Slowly lift your head upward. Lift until you are looking straight ahead. Then continue lifting your head as far back as you can stretch. 4. Hold your head up for 5 seconds. Then slowly lower it to your starting position.  Supine Head Lifts Repeat this exercise 8-10 times. Do this 2 times a day or as told by your health care provider. 1. Lie on your back, bending your knees to point to the ceiling and keeping your feet flat on the floor. 2. Lift your head slowly off the floor, raising your chin toward your chest. 3. Hold for 5 seconds. 4. Relax and repeat.  Scapular Retraction Repeat this exercise 5 times. Do this 2 times a day or as told by  your health care provider. 1. Stand with your arms at your sides. Look straight ahead. 2. Slowly pull both shoulders backward and downward until you feel a stretch between your shoulder blades in your upper back. 3. Hold for 10-30 seconds. 4. Relax and repeat.  Contact a health care provider if:  Your neck pain or discomfort gets much worse when you do an exercise.  Your neck pain or discomfort does not improve within 2 hours after you exercise. If you have any of these problems, stop exercising right away. Do not do the exercises again unless your health care provider says that you can. Get help right away if:  You develop sudden, severe neck pain. If this happens, stop exercising right away. Do not do the exercises again unless your health care provider says that you can. Exercises Neck Stretch  Repeat this exercise 3-5 times. 1. Do this exercise while standing or while sitting in a chair. 2. Place your feet flat on the floor, shoulder-width apart. 3. Slowly turn your head to the right. Turn it all the way to the  right so you can look over your right shoulder. Do not tilt or tip your head. 4. Hold this position for 10-30 seconds. 5. Slowly turn your head to the left, to look over your left shoulder. 6. Hold this position for 10-30 seconds.  Neck Retraction Repeat this exercise 8-10 times. Do this 3-4 times a day or as told by your health care provider. 1. Do this exercise while standing or while sitting in a sturdy chair. 2. Look straight ahead. Do not bend your neck. 3. Use your fingers to push your chin backward. Do not bend your neck for this movement. Continue to face straight ahead. If you are doing the exercise properly, you will feel a slight sensation in your throat and a stretch at the back of your neck. 4. Hold the stretch for 1-2 seconds. Relax and repeat.  This information is not intended to replace advice given to you by your health care provider. Make sure you discuss any questions you have with your health care provider. Document Released: 09/30/2015 Document Revised: 03/26/2016 Document Reviewed: 04/29/2015 Elsevier Interactive Patient Education  2017 [employer].

## 2017-09-17 NOTE — Progress Notes (Signed)
Subjective:    Patient ID: Scott [name], male    DOB: [birth year], 71 y.o.   MRN: [id number]  No chief complaint on file. Pt accompanied by his grandson Herbie [place].  HPI Patient was seen today for f/u.  Neck stiffness: -Unable to turn neck side to side -Duration 6-7 months -Has not taken anything for this problem -States feels like muscles and neck are stiff -Makes it hard to sleep at night. -pt has a h/o R rotator cuff tendonitis and back surgery  Ongoing right shoulder issues: -H/o right rotator cuff tendinitis -Taking Celebrex 100 mg -Also endorses history of arthritis -Patient states if he "gets going" i.e. doing yard work he does not notice the pain but when he is still he notices it -Endorses difficulty pulling off half zip jacket over his head. -Has gone to physical therapy in the past.  h/o back surgery in 2003 or 4  Gout: -Chronic -We will have a flare if he eats beef. -Last flare was a few months ago -Locations vary.  Initially happened in his toe but has had issues with knee and shoulder -Has Rx for colchicine and allopurinol -Is on Maxide which may contribute to flares  B12 def: -has been getting monthly injections x 25-30 yrs -next injection due Dec. -pt does not want to stop.  Past Medical History:  Diagnosis Date  . ANEMIA, OTHER UNSPEC 08/02/2009  . B12 DEFICIENCY 07/07/2007  . Bell's palsy     around 2005  . BPPV (benign paroxysmal positional vertigo)    2003  . GERD 07/13/2007  . Gout, unspecified 06/17/2010  . HYPERLIPIDEMIA 07/13/2007  . HYPERTENSION 07/13/2007  . RENAL INSUFFICIENCY 06/04/2010    Allergies  Allergen Reactions  . Nsaids     REACTION: hx of renal insufficiency  . Statins     myalgias    ROS General: Denies fever, chills, night sweats, changes in weight, changes in appetite HEENT: Denies headaches, ear pain, changes in vision, rhinorrhea, sore throat CV: Denies CP, palpitations, SOB, orthopnea Pulm: Denies SOB, cough,  wheezing GI: Denies abdominal pain, nausea, vomiting, diarrhea, constipation GU: Denies dysuria, hematuria, frequency, vaginal discharge Msk: Denies muscle cramps   +neck stiffness, R shoulder pain/discomfort., h/o gout, h/o arthritis Neuro: Denies weakness, numbness, tingling Skin: Denies rashes, bruising Psych: Denies depression, anxiety, hallucinations     Objective:    Blood pressure 98/70, pulse 60, temperature 97.6 F (36.4 C), temperature source Oral, height 5' 9.5" (1.765 m), weight 175 lb 4.8 oz (79.5 kg).   Gen. Pleasant, well-nourished, in no distress, normal affect  HEENT: [place]/AT, face symmetric, no scleral icterus, PERRLA, nares patent without drainage, pharynx without erythema or exudate. Neck: No JVD, no carotid bruits Lungs: no accessory muscle use, CTAB, no wheezes or rales Cardiovascular: RRR, no m/r/g, no peripheral edema Abdomen: BS present, soft, NT/ND Musculoskeletal: No deformities, no cyanosis or clubbing, normal tone.  Full active ROM with pain in Upper R extremity.  No TTP of b/l  shoulders or spine.  Limited passive ROM of neck, ~ 30 degrees to the R and 40-50 on L. Neuro:  A&Ox3, CN II-XII intact, normal gait Skin:  Warm, no lesions/ rash   Wt Readings from Last 3 Encounters:  09/17/17 175 lb 4.8 oz (79.5 kg)  05/14/17 180 lb 6.4 oz (81.8 kg)  05/07/17 178 lb 8 oz (81 kg)    Lab Results  Component Value Date   WBC 4.4 03/30/2017   HGB 12.6 (L) 03/30/2017   HCT 38.0 (  L) 03/30/2017   PLT 220.0 03/30/2017   GLUCOSE 91 03/30/2017   CHOL 203 (H) 03/30/2017   TRIG 113.0 03/30/2017   HDL 43.30 03/30/2017   LDLDIRECT 131.0 01/24/2016   LDLCALC 137 (H) 03/30/2017   ALT 19 03/30/2017   AST 24 03/30/2017   NA 133 (L) 03/30/2017   K 3.9 03/30/2017   CL 97 03/30/2017   CREATININE 1.30 03/30/2017   BUN 14 03/30/2017   CO2 31 03/30/2017   TSH 1.93 10/10/2014   PSA 3.52 03/30/2017   HGBA1C 6.2 04/19/2012    Assessment/Plan:  Neck stiffness   -Discussed going back to PT.  At this time pt wishes to wait.  Will try some exercises at home. -Will obtain imaging. -May benefit from Sports med or Ortho referral. -Already taking Celebrex and Tramadol prn for pain. - Plan: DG Cervical Spine Complete, predniSONE (DELTASONE) 10 MG tablet  Tendinitis of right rotator cuff -ongoing issue -discussed going back to PT.  Pt wishes to wait at this time -Prednisone taper  Idiopathic chronic gout of multiple sites without tophus -continue current meds.   -Has allopurinol 300 mg daily, colchicine 0.6 mg prn -If has recurrent flares can consider d/c'ing hctz 25 mg.  B12 deficiency -last B12 checked 03/30/17, was 698. -wishes to continue monthly b12 injections. -Pt to schedule next injection for Dec 6th   F/u in next few wks for neck if no improvement.  F/u in ~1 mo for B12 injection.

## 2017-09-22 ENCOUNTER — Other Ambulatory Visit: Payer: Self-pay

## 2017-09-22 DIAGNOSIS — J309 Allergic rhinitis, unspecified: Secondary | ICD-10-CM

## 2017-09-22 MED ORDER — FLUTICASONE PROPIONATE 50 MCG/ACT NA SUSP: 2.0000 | Freq: Every day | NASAL | 1 refills | Status: DC

## 2017-09-27 ENCOUNTER — Telehealth: Payer: Self-pay | Admitting: Family Medicine

## 2017-09-27 NOTE — Telephone Encounter (Signed)
Left  A  Detailed  meassage  On  Answering  macine     Machine   About  rx  Refill    And  Cream  For  muscle  Pain

## 2017-09-27 NOTE — Telephone Encounter (Signed)
Copied from [place] [id number]. Topic: Quick Communication - See Telephone Encounter >> Sep 27, 2017 10:23 AM Synthia Innocent wrote: CRM for notification. See Telephone encounter for:  Requesting refill on traMADol (ULTRAM) 50 MG tablet, also would like to speak to nurse, would like to know what a good cream for muscle pain would be.  09/27/17.

## 2017-09-27 NOTE — Telephone Encounter (Signed)
REFILL  OF    ULTRAM  REQUEST

## 2017-09-28 ENCOUNTER — Other Ambulatory Visit: Payer: Self-pay | Admitting: Emergency Medicine

## 2017-09-28 MED ORDER — TRAMADOL HCL 50 MG PO TABS: 100.0000 mg | ORAL_TABLET | Freq: Two times a day (BID) | ORAL | 2 refills | Status: DC

## 2017-09-28 NOTE — Telephone Encounter (Signed)
Per Dr. Volanda Napoleon okay to refill tramadol. Prescription for tramadol has been phoned into pharmacy. Patient is aware and has no further questions.

## 2017-09-28 NOTE — Telephone Encounter (Signed)
Spoke with patient regarding muscle relaxer cream and also which medication he was needing a refill for. Advise patient that if he would like he could try bio freeze, icy hot, or bengay. Patient agreed and wants to try bio freeze. Patient would also like a refill for tramadol for his back. Patient states that he has been on this medication for years and it has help somewhat relieve the pain.

## 2017-09-30 ENCOUNTER — Ambulatory Visit: Payer: Medicare Other | Admitting: Family Medicine

## 2017-10-06 ENCOUNTER — Ambulatory Visit: Payer: Medicare Other

## 2017-10-06 ENCOUNTER — Encounter: Payer: Self-pay | Admitting: Family Medicine

## 2017-10-06 ENCOUNTER — Ambulatory Visit: Payer: Medicare Other | Admitting: Family Medicine

## 2017-10-06 VITALS — BP 134/80 | HR 87 | Temp 97.7°F | Wt 177.9 lb

## 2017-10-06 DIAGNOSIS — R51 Headache: Secondary | ICD-10-CM | POA: Diagnosis not present

## 2017-10-06 DIAGNOSIS — I10 Essential (primary) hypertension: Secondary | ICD-10-CM

## 2017-10-06 DIAGNOSIS — E538 Deficiency of other specified B group vitamins: Secondary | ICD-10-CM | POA: Diagnosis not present

## 2017-10-06 DIAGNOSIS — R519 Headache, unspecified: Secondary | ICD-10-CM

## 2017-10-06 MED ORDER — CYANOCOBALAMIN 1000 MCG/ML IJ SOLN
1000.0000 ug | Freq: Once | INTRAMUSCULAR | Status: AC
  Administered 2017-10-06: 1000 ug via INTRAMUSCULAR

## 2017-10-06 NOTE — Progress Notes (Signed)
Subjective:    Patient ID: Scott [name], male    DOB: [birth year], 71 y.o.   MRN: [id number]  Chief Complaint  Patient presents with  . Migraine    HPI Patient was seen today for HAs.  Pt expresses concern about having a HA.  Pt had a HA on Tuesday, states hurt on "top of head".  Pt thinks it was a dull ache.  Pt denies changes in vision, CP, difficulty speaking or moving.  He didn't take anything for the HA.  He was able to wash his car while his head was hurting.  Pt concerned b/c he never has HAs.  Pt also wonders if he has anything going on in his brain after having a fall in July 2018.  Pt's brother in law had an aneurysm after having a fall.  Due for monthly b12 injection. Past Medical History:  Diagnosis Date  . ANEMIA, OTHER UNSPEC 08/02/2009  . B12 DEFICIENCY 07/07/2007  . Bell's palsy     around 2005  . BPPV (benign paroxysmal positional vertigo)    2003  . GERD 07/13/2007  . Gout, unspecified 06/17/2010  . HYPERLIPIDEMIA 07/13/2007  . HYPERTENSION 07/13/2007  . RENAL INSUFFICIENCY 06/04/2010    Allergies  Allergen Reactions  . Nsaids     REACTION: hx of renal insufficiency  . Statins     myalgias    ROS General: Denies fever, chills, night sweats, changes in weight, changes in appetite    HEENT: Denies ear pain, changes in vision, rhinorrhea, sore throat  +HA CV: Denies CP, palpitations, SOB, orthopnea Pulm: Denies SOB, cough, wheezing GI: Denies abdominal pain, nausea, vomiting, diarrhea, constipation GU: Denies dysuria, hematuria, frequency, vaginal discharge Msk: Denies muscle cramps, joint pains Neuro: Denies weakness, numbness, tingling Skin: Denies rashes, bruising Psych: Denies depression, anxiety, hallucinations     Objective:    Blood pressure 134/80, pulse 87, temperature 97.7 F (36.5 C), temperature source Oral, weight 177 lb 14.4 oz (80.7 kg).   Gen. Pleasant, well-nourished, in no distress, normal affect   HEENT: wearing glasses, [place]/AT, face  symmetric, no scleral icterus, PERRLA, EOMI without nystagmus, nares patent without drainage, pharynx without erythema or exudate. Lungs: no accessory muscle use, CTAB, no wheezes or rales Cardiovascular: RRR, no m/r/g, no peripheral edema Musculoskeletal: No deformities, no cyanosis or clubbing, normal tone Neuro:  A&Ox3, CN II-XII intact, normal gait. Normal finger to nose, normal rapid alternating movement Skin:  Warm, no lesions/ rash   Wt Readings from Last 3 Encounters:  10/06/17 177 lb 14.4 oz (80.7 kg)  09/17/17 175 lb 4.8 oz (79.5 kg)  05/14/17 180 lb 6.4 oz (81.8 kg)    Lab Results  Component Value Date   WBC 4.4 03/30/2017   HGB 12.6 (L) 03/30/2017   HCT 38.0 (L) 03/30/2017   PLT 220.0 03/30/2017   GLUCOSE 91 03/30/2017   CHOL 203 (H) 03/30/2017   TRIG 113.0 03/30/2017   HDL 43.30 03/30/2017   LDLDIRECT 131.0 01/24/2016   LDLCALC 137 (H) 03/30/2017   ALT 19 03/30/2017   AST 24 03/30/2017   NA 133 (L) 03/30/2017   K 3.9 03/30/2017   CL 97 03/30/2017   CREATININE 1.30 03/30/2017   BUN 14 03/30/2017   CO2 31 03/30/2017   TSH 1.93 10/10/2014   PSA 3.52 03/30/2017   HGBA1C 6.2 04/19/2012    Assessment/Plan:  Acute nonintractable headache, unspecified headache type -no red flag symptoms -Pt to start keeping a HA diary. -Pt advised ok to  take Tylenol prn for HA -Pt also advised to check bp at home to see if there is a correlation with elevated bp and HA. -pt encouraged to increase po intake of water. -f/u in the next few wks to 1 month for HAs. -given RTC or ED precautions.  Essential HTN -continue losartan 50 mg dialy -encouraged to purchase a bp cuff for daily home monitoring or see if his wife's bp cuff is in working condition.  F/u in next few wks to 1 month for HAs.  Sooner if needed.

## 2017-10-06 NOTE — Patient Instructions (Addendum)
Please keep a record of the frequency of your headaches, where the pain is, a description of the pain, how long it last, and if you take any Tylenol for the pain did it help.  Of course, if your headache is the worst pain you have ever felt, causes changes in vision, or wakes you up from your sleep please seek help immediately by going to the Emergency Department or calling 911   It is ok to take Tylenol for your headaches.    Remember to keep drinking more water and less sodas.   General Headache Without Cause A headache is pain or discomfort felt around the head or neck area. There are many causes and types of headaches. In some cases, the cause may not be found. Follow these instructions at home: Managing pain  Take over-the-counter and prescription medicines only as told by your doctor.  Lie down in a dark, quiet room when you have a headache.  If directed, apply ice to the head and neck area: ? Put ice in a plastic bag. ? Place a towel between your skin and the bag. ? Leave the ice on for 20 minutes, 2-3 times per day.  Use a heating pad or hot shower to apply heat to the head and neck area as told by your doctor.  Keep lights dim if bright lights bother you or make your headaches worse. Eating and drinking  Eat meals on a regular schedule.  Lessen how much alcohol you drink.  Lessen how much caffeine you drink, or stop drinking caffeine. General instructions  Keep all follow-up visits as told by your doctor. This is important.  Keep a journal to find out if certain things bring on headaches. For example, write down: ? What you eat and drink. ? How much sleep you get. ? Any change to your diet or medicines.  Relax by getting a massage or doing other relaxing activities.  Lessen stress.  Sit up straight. Do not tighten (tense) your muscles.  Do not use tobacco products. This includes cigarettes, chewing tobacco, or e-cigarettes. If you need help quitting, ask your  doctor.  Exercise regularly as told by your doctor.  Get enough sleep. This often means 7-9 hours of sleep. Contact a doctor if:  Your symptoms are not helped by medicine.  You have a headache that feels different than the other headaches.  You feel sick to your stomach (nauseous) or you throw up (vomit).  You have a fever. Get help right away if:  Your headache becomes really bad.  You keep throwing up.  You have a stiff neck.  You have trouble seeing.  You have trouble speaking.  You have pain in the eye or ear.  Your muscles are weak or you lose muscle control.  You lose your balance or have trouble walking.  You feel like you will pass out (faint) or you pass out.  You have confusion. This information is not intended to replace advice given to you by your health care provider. Make sure you discuss any questions you have with your health care provider. Document Released: 07/28/2008 Document Revised: 03/26/2016 Document Reviewed: 02/11/2015 Elsevier Interactive Patient Education  2018 [employer]. Headache and Arthritis If you have arthritis and headaches, it is possible the two problems are related. Some headaches can be caused by arthritis in your neck (cervicogenic headaches). Pain medicine is another possible link between arthritis and headache. If you take a lot of over-the-counter medicines for arthritis pain,  you may develop the type of headache that can happen when you stop taking your over-the-counter pain reliever or lower your dose too quickly (rebound headache). What types of arthritis can cause a headache? There are two types of arthritis, rheumatoid arthritis and osteoarthritis. Both types of arthritis can cause headaches.  Rheumatoid arthritis (RA) is an autoimmune disease that causes inflammation of your joints. When you have RA, your body's defense system (immune system) attacks the joints of your body and causes inflammation. This can lead to  deformity over time.  Osteoarthritis (OA) is wear and tear caused by joint use over time. Osteoarthritis is not an inflammatory disease.  Both OA and RA can cause neck pain that is felt in the head. When the pain is felt in a different location than it originates, it is called radiating or referred pain. This pain is usually felt in the back of the head. How are headaches and arthritis related? RA can affect any joint in the body, including the joints between the bones of the neck (cervical vertebrae). The neck joints most commonly affected by RA are the top two joints, between the first and second cervical vertebra. Inflammation in these joints may be felt as neck pain and head pain. OA of the neck may be caused by gradual wear and tear or by a neck injury. Neck vertebrae may develop calcium deposits in the areas where muscle attach. Wear and tear of the vertebra may cause pressure on the nerves that leave the spinal cord. These changes can cause referred pain that may be felt as a headache. How are headaches associated with arthritis diagnosed?  Your health care provider may diagnose headache caused by RA if you have inflammation of vertebrae in your neck. You may have: ? Blood tests to measure how much inflammation you have. ? Imaging studies of your neck (MRI) to check for inflammation of cervical vertebrae.  Your health care provider may diagnose headache caused by OA if an X-ray shows: ? Calcium deposits. ? Bone spurs. ? Narrowing of the space between neck vertebrae.  Your health care provider may diagnose rebound headache if you have a history of using over-the-counter pain relievers frequently. When should I seek care for my headaches? Call your health care provider if:  You have more than three headaches per week.  You take an over-the-counter pain reliever almost every day.  Your headaches are getting worse and happening more often.  You have headache with fever.  You have  headache with numbness, weakness, or dizziness.  You have headache with nausea or vomiting.  What are my treatment options?  If you have headache caused by RA, treatment may include: ? Over-the-counter or prescription-strength anti-inflammatory medicines. ? Disease-modifying antirheumatic drugs (DMARDs). These medicines slow or stop the progression of RA.  If you have headache caused by OA, treatment may include: ? Over-the-counter pain medicines. ? Heat or massage. ? Physical therapy.  If you have rebound headaches: ? They will usually go away within several days of stopping the medicine that caused them. ? You may be able to gradually reduce the amount of medicines you take to prevent headache. ? Ask your health care provider if you can take another type of medicine instead. This information is not intended to replace advice given to you by your health care provider. Make sure you discuss any questions you have with your health care provider. Document Released: 01/09/2004 Document Revised: 03/26/2016 Document Reviewed: 01/22/2014 Elsevier Interactive Patient Education  2018  [employer].

## 2017-10-13 ENCOUNTER — Telehealth: Payer: Self-pay | Admitting: Family Medicine

## 2017-10-13 NOTE — Telephone Encounter (Signed)
Copied from [place] [id number]. Topic: Quick Communication - See Telephone Encounter >> Oct 13, 2017  4:18 PM Boyd Kerbs wrote: CRM for notification. See Telephone encounter for:  Prescription refill for Celebrex is needed. Patient is out, he called pharmacy on Sat. To have this filled. Preferred Pharmacy     [id number] - [place], [place]  Patient is asking to speak to the nurse for  Dr. Volanda Napoleon   10/13/17.

## 2017-10-14 NOTE — Telephone Encounter (Signed)
Would you like for me to send refill to patient pharmacy?

## 2017-10-14 NOTE — Telephone Encounter (Signed)
Pt asking for refill on Celebrex. He has refills for it but it looks like they expired on 8/18.

## 2017-10-15 ENCOUNTER — Other Ambulatory Visit: Payer: Self-pay | Admitting: Emergency Medicine

## 2017-10-15 MED ORDER — CELECOXIB 100 MG PO CAPS: 100.0000 mg | ORAL_CAPSULE | Freq: Two times a day (BID) | ORAL | 5 refills | Status: DC | PRN

## 2017-10-15 NOTE — Telephone Encounter (Signed)
Ok to refill Celebrex.

## 2017-10-15 NOTE — Telephone Encounter (Signed)
Refill has been sent to patient pharmacy.  

## 2017-11-05 ENCOUNTER — Ambulatory Visit (INDEPENDENT_AMBULATORY_CARE_PROVIDER_SITE_OTHER): Payer: Medicare Other

## 2017-11-05 DIAGNOSIS — E538 Deficiency of other specified B group vitamins: Secondary | ICD-10-CM

## 2017-11-05 MED ORDER — CYANOCOBALAMIN 1000 MCG/ML IJ SOLN
1000.0000 ug | Freq: Once | INTRAMUSCULAR | Status: AC
  Administered 2017-11-05: 1000 ug via INTRAMUSCULAR

## 2017-11-08 ENCOUNTER — Ambulatory Visit (INDEPENDENT_AMBULATORY_CARE_PROVIDER_SITE_OTHER): Payer: Medicare Other | Admitting: Family Medicine

## 2017-11-08 ENCOUNTER — Encounter: Payer: Self-pay | Admitting: Family Medicine

## 2017-11-08 VITALS — BP 110/68 | HR 68 | Temp 98.1°F | Wt 175.6 lb

## 2017-11-08 DIAGNOSIS — E871 Hypo-osmolality and hyponatremia: Secondary | ICD-10-CM | POA: Diagnosis not present

## 2017-11-08 DIAGNOSIS — G4452 New daily persistent headache (NDPH): Secondary | ICD-10-CM | POA: Diagnosis not present

## 2017-11-08 LAB — BASIC METABOLIC PANEL
BUN: 13 mg/dL (ref 6–23)
CALCIUM: 9.4 mg/dL (ref 8.4–10.5)
CO2: 31 meq/L (ref 19–32)
Chloride: 92 mEq/L — ABNORMAL LOW (ref 96–112)
Creatinine, Ser: 1.32 mg/dL (ref 0.40–1.50)
GFR: 68.21 mL/min (ref >60.00)
GLUCOSE: 95 mg/dL (ref 70–99)
POTASSIUM: 3.9 meq/L (ref 3.5–5.1)
SODIUM: 130 meq/L — AB (ref 135–145)

## 2017-11-08 NOTE — Progress Notes (Addendum)
Subjective:    Patient ID: Scott [name], male    DOB: 1946-07-09, 72 y.o.   MRN: [id number]  Chief Complaint  Patient presents with  . Headache    HPI Patient was seen today for f/u on HAs.  Pt states he never gets HAs, but has been having regular HAs for the last few months.  Pt states he does not take anything for his HAs.  Pain is a ache, 7/10, located in the occipital/parietal region of his head.  Patient endorses headache when laying down and when waking up in the morning.  Patient endorses a headache this morning that has since resolved.  Patient typically drinks~44 ounces of soda all day, and some water (2 glasses/day).  Patient endorses difficulty sleeping, states may get 2 or so hours per night before having to get up.  Headache pain may wake him up.  Past Medical History:  Diagnosis Date  . ANEMIA, OTHER UNSPEC 08/02/2009  . B12 DEFICIENCY 07/07/2007  . Bell's palsy     around 2005  . BPPV (benign paroxysmal positional vertigo)    2003  . GERD 07/13/2007  . Gout, unspecified 06/17/2010  . HYPERLIPIDEMIA 07/13/2007  . HYPERTENSION 07/13/2007  . RENAL INSUFFICIENCY 06/04/2010    Allergies  Allergen Reactions  . Nsaids     REACTION: hx of renal insufficiency  . Statins     myalgias    ROS General: Denies fever, chills, night sweats, changes in weight, changes in appetite  +difficulty sleeping HEENT: Denies ear pain, changes in vision, rhinorrhea, sore throat  +HAs CV: Denies CP, palpitations, SOB, orthopnea Pulm: Denies SOB, cough, wheezing GI: Denies abdominal pain, nausea, vomiting, diarrhea, constipation GU: Denies dysuria, hematuria, frequency, vaginal discharge Msk: Denies muscle cramps, joint pains Neuro: Denies weakness, numbness, tingling Skin: Denies rashes, bruising Psych: Denies depression, anxiety, hallucinations     Objective:    Blood pressure 110/68, pulse 68, temperature 98.1 F (36.7 C), temperature source Oral, weight 175 lb 9.6 oz (79.7  kg). Orthostatics: 120/70 pulse 63, sitting 116/80 pulse 57, standing 110/80 pulse 64  Gen. Pleasant, well-nourished, in no distress, normal affect  HEENT: [place]/AT, face symmetric, conjunctiva clear, no scleral icterus, PERRLA, EOMI, nares patent without drainage, pharynx without erythema or exudate. Neck: No JVD, no thyromegaly, no carotid bruits Lungs: no accessory muscle use, CTAB, no wheezes or rales Cardiovascular: RRR, no m/r/g, no peripheral edema Musculoskeletal: No deformities, no cyanosis or clubbing, normal tone  Neuro:  A&Ox3, CN II-XII intact, normal gait Skin:  Warm, no lesions/ rash   Wt Readings from Last 3 Encounters:  11/08/17 175 lb 9.6 oz (79.7 kg)  10/06/17 177 lb 14.4 oz (80.7 kg)  09/17/17 175 lb 4.8 oz (79.5 kg)    Lab Results  Component Value Date   WBC 4.4 03/30/2017   HGB 12.6 (L) 03/30/2017   HCT 38.0 (L) 03/30/2017   PLT 220.0 03/30/2017   GLUCOSE 91 03/30/2017   CHOL 203 (H) 03/30/2017   TRIG 113.0 03/30/2017   HDL 43.30 03/30/2017   LDLDIRECT 131.0 01/24/2016   LDLCALC 137 (H) 03/30/2017   ALT 19 03/30/2017   AST 24 03/30/2017   NA 133 (L) 03/30/2017   K 3.9 03/30/2017   CL 97 03/30/2017   CREATININE 1.30 03/30/2017   BUN 14 03/30/2017   CO2 31 03/30/2017   TSH 1.93 10/10/2014   PSA 3.52 03/30/2017   HGBA1C 6.2 04/19/2012    Assessment/Plan:  New daily persistent headache  -exam reassuring as  no bruits and no neuro deficits -Discussed taking Tylenol prn for HAs -Discussed increasing po intake of water daily. -Given continued symptoms and waking up with HA will proceed with imaging. - Plan: MR Brain W Wo Contrast, Basic metabolic panel, Ambulatory referral to Neurology -F/u in 1 mo, sooner if needed.    Grier Mitts, MD

## 2017-11-08 NOTE — Patient Instructions (Addendum)

## 2017-11-09 DIAGNOSIS — H25812 Combined forms of age-related cataract, left eye: Secondary | ICD-10-CM | POA: Diagnosis not present

## 2017-11-10 ENCOUNTER — Ambulatory Visit
Admission: RE | Admit: 2017-11-10 | Discharge: 2017-11-10 | Disposition: A | Discharge status: Home / Self Care | Payer: Medicare Other | Source: Ambulatory Visit | Attending: Family Medicine | Admitting: Family Medicine

## 2017-11-10 DIAGNOSIS — G4452 New daily persistent headache (NDPH): Secondary | ICD-10-CM

## 2017-11-10 DIAGNOSIS — I639 Cerebral infarction, unspecified: Secondary | ICD-10-CM | POA: Diagnosis not present

## 2017-11-10 MED ORDER — GADOBENATE DIMEGLUMINE 529 MG/ML IV SOLN
15.0000 mL | Freq: Once | INTRAVENOUS | Status: AC | PRN
  Administered 2017-11-10: 15 mL via INTRAVENOUS

## 2017-11-12 ENCOUNTER — Other Ambulatory Visit: Payer: Self-pay | Admitting: Family Medicine

## 2017-11-12 DIAGNOSIS — E871 Hypo-osmolality and hyponatremia: Secondary | ICD-10-CM | POA: Diagnosis not present

## 2017-11-12 LAB — BASIC METABOLIC PANEL
BUN/Creatinine Ratio: 8 (calc) (ref 6–22)
BUN: 11 mg/dL (ref 7–25)
CO2: 30 mmol/L (ref 20–32)
CREATININE: 1.43 mg/dL — AB (ref 0.70–1.18)
Calcium: 9.5 mg/dL (ref 8.6–10.3)
Chloride: 93 mmol/L — ABNORMAL LOW (ref 98–110)
Glucose, Bld: 91 mg/dL (ref 65–99)
Potassium: 4 mmol/L (ref 3.5–5.3)
Sodium: 130 mmol/L — ABNORMAL LOW (ref 135–146)

## 2017-11-12 NOTE — Addendum Note (Signed)
Addended by: Elmer Picker on: 11/12/2017 04:08 PM   Modules accepted: Orders

## 2017-11-13 ENCOUNTER — Other Ambulatory Visit: Payer: Self-pay | Admitting: Family Medicine

## 2017-11-13 NOTE — Progress Notes (Signed)
Patient called.  Patient aware.  Pt advised to go to the ED for evaluation and correction of mild hyponatremia. Currently pt is asymptomatic.  Per review of med list for causes of hyponatremia, pt states he is no longer taking the PPI.  Discussed with pt the potential consequences of further decline in sodium.  Pt is hesitant to go to the ED.  He states if he starts feeling bad he will go.  Pt's wife informed of the above.  She states she will try to get him to go to the ED.

## 2017-11-17 NOTE — Addendum Note (Signed)
Addended by: Grier Mitts R on: 11/17/2017 07:04 PM   Modules accepted: Level of Service

## 2017-11-17 NOTE — Progress Notes (Signed)
Pt seen by nurse for B12 injection.

## 2017-12-07 ENCOUNTER — Ambulatory Visit (INDEPENDENT_AMBULATORY_CARE_PROVIDER_SITE_OTHER): Payer: Medicare Other | Admitting: *Deleted

## 2017-12-07 DIAGNOSIS — E538 Deficiency of other specified B group vitamins: Secondary | ICD-10-CM

## 2017-12-07 MED ORDER — CYANOCOBALAMIN 1000 MCG/ML IJ SOLN
1000.0000 ug | Freq: Once | INTRAMUSCULAR | Status: AC
  Administered 2017-12-07: 1000 ug via INTRAMUSCULAR

## 2017-12-07 NOTE — Progress Notes (Addendum)
Per orders of Dr. Banks, injection of B12 given by Jalyssa Fleisher J Ariyan Brisendine. Patient tolerated injection well. 

## 2017-12-29 ENCOUNTER — Other Ambulatory Visit: Payer: Self-pay | Admitting: Family Medicine

## 2018-01-04 ENCOUNTER — Ambulatory Visit: Payer: Medicare Other

## 2018-01-05 ENCOUNTER — Other Ambulatory Visit: Payer: Self-pay | Admitting: Family Medicine

## 2018-01-05 ENCOUNTER — Ambulatory Visit (INDEPENDENT_AMBULATORY_CARE_PROVIDER_SITE_OTHER): Payer: Medicare Other | Admitting: Family Medicine

## 2018-01-05 DIAGNOSIS — E538 Deficiency of other specified B group vitamins: Secondary | ICD-10-CM

## 2018-01-05 MED ORDER — TRAMADOL HCL 50 MG PO TABS: 100.0000 mg | ORAL_TABLET | Freq: Two times a day (BID) | ORAL | 2 refills | Status: DC

## 2018-01-05 MED ORDER — CYANOCOBALAMIN 1000 MCG/ML IJ SOLN
1000.0000 ug | Freq: Once | INTRAMUSCULAR | Status: AC
  Administered 2018-01-05: 1000 ug via INTRAMUSCULAR

## 2018-01-05 NOTE — Progress Notes (Signed)
Per orders of Dr. Banks, injection of Vitamin B 12 given by NIMMONS, SYLVIA ANN. Patient tolerated injection well. 

## 2018-02-02 ENCOUNTER — Telehealth: Payer: Self-pay | Admitting: Family Medicine

## 2018-02-02 ENCOUNTER — Ambulatory Visit (INDEPENDENT_AMBULATORY_CARE_PROVIDER_SITE_OTHER): Payer: Medicare Other | Admitting: Family Medicine

## 2018-02-02 DIAGNOSIS — E538 Deficiency of other specified B group vitamins: Secondary | ICD-10-CM | POA: Diagnosis not present

## 2018-02-02 MED ORDER — CYANOCOBALAMIN 1000 MCG/ML IJ SOLN
1000.0000 ug | Freq: Once | INTRAMUSCULAR | Status: AC
  Administered 2018-02-02: 1000 ug via INTRAMUSCULAR

## 2018-02-02 NOTE — Telephone Encounter (Signed)
How often are Vitamin B 12 injections and are labs due for this?

## 2018-02-02 NOTE — Progress Notes (Signed)
Per orders of Dr. Banks, injection of Vitamin B 12 given by NIMMONS, SYLVIA ANN. Patient tolerated injection well. 

## 2018-02-07 ENCOUNTER — Other Ambulatory Visit: Payer: Self-pay | Admitting: Family Medicine

## 2018-02-07 DIAGNOSIS — E538 Deficiency of other specified B group vitamins: Secondary | ICD-10-CM

## 2018-02-07 NOTE — Telephone Encounter (Signed)
I spoke with pt and went over below information, also scheduled lab appointment.

## 2018-02-07 NOTE — Telephone Encounter (Signed)
Pt had a chronic h/o b12 def.  I have tried to get him to check his b12.  If he has not had a recent b12 check he should have this done.  We may be able to space out his B12 injections every few months as opposed to every month.

## 2018-02-16 ENCOUNTER — Encounter: Payer: Self-pay | Admitting: Family Medicine

## 2018-02-16 ENCOUNTER — Ambulatory Visit (INDEPENDENT_AMBULATORY_CARE_PROVIDER_SITE_OTHER): Payer: Medicare Other | Admitting: Family Medicine

## 2018-02-16 VITALS — BP 124/58 | Temp 97.6°F | Ht 69.5 in | Wt 178.7 lb

## 2018-02-16 DIAGNOSIS — M7581 Other shoulder lesions, right shoulder: Secondary | ICD-10-CM

## 2018-02-16 DIAGNOSIS — G44229 Chronic tension-type headache, not intractable: Secondary | ICD-10-CM | POA: Diagnosis not present

## 2018-02-16 DIAGNOSIS — M778 Other enthesopathies, not elsewhere classified: Secondary | ICD-10-CM

## 2018-02-16 MED ORDER — PREDNISONE 10 MG PO TABS: ORAL_TABLET | ORAL | 0 refills | Status: DC

## 2018-02-16 NOTE — Patient Instructions (Addendum)
Her last physical was May 29th if you would like you can schedule a physical next month, 2018.  On or after Mar 31, 2018.   Tension Headache A tension headache is pain, pressure, or aching that is felt over the front and sides of your head. These headaches can last from 30 minutes to several days. Follow these instructions at home: Managing pain  Take over-the-counter and prescription medicines only as told by your doctor.  Lie down in a dark, quiet room when you have a headache.  If directed, apply ice to your head and neck area: ? Put ice in a plastic bag. ? Place a towel between your skin and the bag. ? Leave the ice on for 20 minutes, 2-3 times per day.  Use a heating pad or a hot shower to apply heat to your head and neck area as told by your doctor. Eating and drinking  Eat meals on a regular schedule.  Do not drink a lot of alcohol.  Do not use a lot of caffeine, or stop using caffeine. General instructions  Keep all follow-up visits as told by your doctor. This is important.  Keep a journal to find out if certain things bring on headaches. For example, write down: ? What you eat and drink. ? How much sleep you get. ? Any change to your diet or medicines.  Try getting a massage, or doing other things that help you to relax.  Lessen stress.  Sit up straight. Do not tighten (tense) your muscles.  Do not use tobacco products. This includes cigarettes, chewing tobacco, or e-cigarettes. If you need help quitting, ask your doctor.  Exercise regularly as told by your doctor.  Get enough sleep. This may mean 7-9 hours of sleep. Contact a doctor if:  Your symptoms are not helped by medicine.  You have a headache that feels different from your usual headache.  You feel sick to your stomach (nauseous) or you throw up (vomit).  You have a fever. Get help right away if:  Your headache becomes very bad.  You keep throwing up.  You have a stiff neck.  You have  trouble seeing.  You have trouble speaking.  You have pain in your eye or ear.  Your muscles are weak or you lose muscle control.  You lose your balance or you have trouble walking.  You feel like you will pass out (faint) or you pass out.  You have confusion. This information is not intended to replace advice given to you by your health care provider. Make sure you discuss any questions you have with your health care provider. Document Released: 01/13/2010 Document Revised: 06/18/2016 Document Reviewed: 02/11/2015 Elsevier Interactive Patient Education  2018 [employer] Cuff Tendinitis Rotator cuff tendinitis is inflammation of the tough, cord-like bands that connect muscle to bone (tendons) in the rotator cuff. The rotator cuff includes all of the muscles and tendons that connect the arm to the shoulder. The rotator cuff holds the head of the upper arm bone (humerus) in the cup (fossa) of the shoulder blade (scapula). This condition can lead to a long-lasting (chronic) tear. The tear may be partial or complete. What are the causes? This condition is usually caused by overusing the rotator cuff. What increases the risk? This condition is more likely to develop in athletes and workers who frequently use their shoulder or reach over their heads. This can include activities such as:  Tennis.  Baseball or softball.  Swimming.  Construction work.  Painting.  What are the signs or symptoms? Symptoms of this condition include:  Pain spreading (radiating) from the shoulder to the upper arm.  Swelling and tenderness in front of the shoulder.  Pain when reaching, pulling, or lifting the arm above the head.  Pain when lowering the arm from above the head.  Minor pain in the shoulder when resting.  Increased pain in the shoulder at night.  Difficulty placing the arm behind the back.  How is this diagnosed? This condition is diagnosed with a medical history and  physical exam. Tests may also be done, including:  X-rays.  MRI.  Ultrasounds.  CT or MR arthrogram. During this test, a contrast material is injected and then images are taken.  How is this treated? Treatment for this condition depends on the severity of the condition. In less severe cases, treatment may include:  Rest. This may be done with a sling that holds the shoulder still (immobilization). Your health care provider may also recommend avoiding activities that involve lifting your arm over your head.  Icing the shoulder.  Anti-inflammatory medicines, such as aspirin or ibuprofen.  In more severe cases, treatment may include:  Physical therapy.  Steroid injections.  Surgery.  Follow these instructions at home: If you have a sling:  Wear the sling as told by your health care provider. Remove it only as told by your health care provider.  Loosen the sling if your fingers tingle, become numb, or turn cold and blue.  Keep the sling clean.  If the sling is not waterproof, do not let it get wet. Remove it, if allowed, or cover it with a watertight covering when you take a bath or shower. Managing pain, stiffness, and swelling  If directed, put ice on the injured area. ? If you have a removable sling, remove it as told by your health care provider. ? Put ice in a plastic bag. ? Place a towel between your skin and the bag. ? Leave the ice on for 20 minutes, 2-3 times a day.  Move your fingers often to avoid stiffness and to lessen swelling.  Raise (elevate) the injured area above the level of your heart while you are lying down.  Find a comfortable sleeping position or sleep on a recliner, if available. Driving  Do not drive or use heavy machinery while taking prescription pain medicine.  Ask your health care provider when it is safe to drive if you have a sling on your arm. Activity  Rest your shoulder as told by your health care provider.  Return to your  normal activities as told by your health care provider. Ask your health care provider what activities are safe for you.  Do any exercises or stretches as told by your health care provider.  If you do repetitive overhead tasks, take small breaks in between and include stretching exercises as told by your health care provider. General instructions  Do not use any products that contain nicotine or tobacco, such as cigarettes and e-cigarettes. These can delay healing. If you need help quitting, ask your health care provider.  Take over-the-counter and prescription medicines only as told by your health care provider.  Keep all follow-up visits as told by your health care provider. This is important. Contact a health care provider if:  Your pain gets worse.  You have new pain in your arm, hands, or fingers.  Your pain is not relieved with medicine or does not get better after  6 weeks of treatment.  You have cracking sensations when moving your shoulder in certain directions.  You hear a snapping sound after using your shoulder, followed by severe pain and weakness. Get help right away if:  Your arm, hand, or fingers are numb or tingling.  Your arm, hand, or fingers are swollen or painful or they turn white or blue. Summary  Rotator cuff tendinitis is inflammation of the tough, cord-like bands that connect muscle to bone (tendons) in the rotator cuff.  This condition is usually caused by overusing the rotator cuff, which includes all of the muscles and tendons that connect the arm to the shoulder.  This condition is more likely to develop in athletes and workers who frequently use their shoulder or reach over their heads.  Treatment generally includes rest, anti-inflammatory medicines, and icing. In some cases, physical therapy and steroid injections may be needed. In severe cases, surgery may be needed. This information is not intended to replace advice given to you by your health care  provider. Make sure you discuss any questions you have with your health care provider. Document Released: 01/09/2004 Document Revised: 10/05/2016 Document Reviewed: 10/05/2016 Elsevier Interactive Patient Education  2017 [employer].

## 2018-02-16 NOTE — Progress Notes (Signed)
Subjective:    Patient ID: Scott [name], male    DOB: July 16, 1946, 72 y.o.   MRN: [id number]  Chief Complaint  Patient presents with  . Headache    HPI Patient was seen today for ongoing concern.  Patient endorses headache when he looks up.  Cspine xrays on 09/17/18 had degenerative changes.  Pt also endorses right shoulder pain off and on times 1 week after mowing the grass with a riding mower that has the 2 levers for steering.  Pt endorses gripping the levers so tight while mowing that he made the fingers of his L hand numb.  In the past patient endorsed headache MRI of brain was negative.  Pt was referred to neurology however he did not make the appointment.  Pt is continuing to have HAs off and on.  Pt endorses HAs mostly when he sits down at the end of the day.  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 Pt has not taken anything for his head.   Past Medical History:  Diagnosis Date  . ANEMIA, OTHER UNSPEC 08/02/2009  . B12 DEFICIENCY 07/07/2007  . Bell's palsy     around 2005  . BPPV (benign paroxysmal positional vertigo)    2003  . GERD 07/13/2007  . Gout, unspecified 06/17/2010  . HYPERLIPIDEMIA 07/13/2007  . HYPERTENSION 07/13/2007  . RENAL INSUFFICIENCY 06/04/2010    Allergies  Allergen Reactions  . Nsaids     REACTION: hx of renal insufficiency  . Statins     myalgias    ROS General: Denies fever, chills, night sweats, changes in weight, changes in appetite HEENT: Denies ear pain, changes in vision, rhinorrhea, sore throat  +HAs CV: Denies CP, palpitations, SOB, orthopnea Pulm: Denies SOB, cough, wheezing GI: Denies abdominal pain, nausea, vomiting, diarrhea, constipation GU: Denies dysuria, hematuria, frequency, vaginal discharge Msk: Denies muscle cramps, joint pains  +R shoulder pain Neuro: Denies weakness, numbness, tingling Skin: Denies rashes, bruising Psych: Denies depression, anxiety, hallucinations      Objective:    Blood pressure (!) 124/58, temperature 97.6 F (36.4 C), temperature source Oral, height 5' 9.5" (1.765 m), weight 178 lb 11.2 oz (81.1 kg).   Gen. Pleasant, well-nourished, in no distress, normal affect   HEENT: [place]/AT, face symmetric,  no scleral icterus, PERRLA, nares patent without drainage, pharynx without erythema or exudate. Lungs: no accessory muscle use, CTAB, no wheezes or rales Cardiovascular: RRR, no m/r/g, no peripheral edema Musculoskeletal: No deformities, no cyanosis or clubbing, normal tone.  Mild TTP of right distal clavic near [facility] joint.  TTP of supraspinatus.   limited range of motion with active movement and passive movement 2/2 pain.  Abduction of right limited to 80 degrees with the assistance of left hand.  No deformities noted Neuro:  A&Ox3, CN II-XII intact, normal gait Skin:  Warm, no lesions/ rash.  6 x 6 cm lipoma on right upper back at the medial edge of scapula.   Wt Readings from Last 3 Encounters:  02/16/18 178 lb 11.2 oz (81.1 kg)  11/08/17 175 lb 9.6 oz (79.7 kg)  10/06/17 177 lb 14.4 oz (80.7 kg)    Lab Results  Component Value Date   WBC 4.4 03/30/2017   HGB 12.6 (L) 03/30/2017   HCT 38.0 (L) 03/30/2017   PLT 220.0 03/30/2017   GLUCOSE 91 11/12/2017   CHOL 203 (H) 03/30/2017   TRIG 113.0 03/30/2017   HDL 43.30  03/30/2017   LDLDIRECT 131.0 01/24/2016   LDLCALC 137 (H) 03/30/2017   ALT 19 03/30/2017   AST 24 03/30/2017   NA 130 (L) 11/12/2017   K 4.0 11/12/2017   CL 93 (L) 11/12/2017   CREATININE 1.43 (H) 11/12/2017   BUN 11 11/12/2017   CO2 30 11/12/2017   TSH 1.93 10/10/2014   PSA 3.52 03/30/2017   HGBA1C 6.2 04/19/2012    Assessment/Plan:  Tendonitis of shoulder, right  -Patient advised to take Tylenol as needed -Patient advised to rest and refrain from excessive use of his arm/shoulder for the next wk or two. -Patient given handout -Patient has tramadol if needed   - Plan: predniSONE (DELTASONE) 10 MG  tablet  Chronic tension-type headache, not intractable -pt advised to reconsider Neurology eval given continued HAs. -Xray of cspine and MRI head negative for causes of HA -Patient encouraged to stay hydrated. -Okay to take Tylenol as needed -Patient states he has tramadol  follow-up next month for CPE.  Patient also has upcoming lab appointment for B12 level.  Order is already been placed  Grier Mitts, MD

## 2018-03-03 ENCOUNTER — Other Ambulatory Visit (INDEPENDENT_AMBULATORY_CARE_PROVIDER_SITE_OTHER): Payer: Medicare Other

## 2018-03-03 ENCOUNTER — Ambulatory Visit (INDEPENDENT_AMBULATORY_CARE_PROVIDER_SITE_OTHER): Payer: Medicare Other | Admitting: *Deleted

## 2018-03-03 DIAGNOSIS — E538 Deficiency of other specified B group vitamins: Secondary | ICD-10-CM

## 2018-03-03 LAB — VITAMIN B12: VITAMIN B 12: 850 pg/mL (ref 211–911)

## 2018-03-03 MED ORDER — CYANOCOBALAMIN 1000 MCG/ML IJ SOLN
1000.0000 ug | Freq: Once | INTRAMUSCULAR | Status: AC
  Administered 2018-03-03: 1000 ug via INTRAMUSCULAR

## 2018-03-03 NOTE — Progress Notes (Signed)
Per orders of Dr.  Volanda Napoleon, injection of B12 given by Dorrene German. Patient tolerated injection well.  Patient also had B12 labs drawn today prior to receiving injection and is aware that dosing schedule may change moving forward depending on labs.

## 2018-03-04 ENCOUNTER — Other Ambulatory Visit: Payer: Medicare Other

## 2018-03-04 ENCOUNTER — Ambulatory Visit: Payer: Medicare Other

## 2018-03-08 ENCOUNTER — Encounter: Payer: Self-pay | Admitting: Family Medicine

## 2018-03-08 ENCOUNTER — Ambulatory Visit (INDEPENDENT_AMBULATORY_CARE_PROVIDER_SITE_OTHER): Payer: Medicare Other | Admitting: Family Medicine

## 2018-03-08 VITALS — BP 100/62 | HR 62 | Temp 98.4°F | Wt 175.0 lb

## 2018-03-08 DIAGNOSIS — M542 Cervicalgia: Secondary | ICD-10-CM

## 2018-03-08 DIAGNOSIS — G44229 Chronic tension-type headache, not intractable: Secondary | ICD-10-CM | POA: Diagnosis not present

## 2018-03-08 NOTE — Progress Notes (Signed)
Subjective:    Patient ID: Scott [name], male    DOB: [birth year], 72 y.o.   MRN: [id number]  No chief complaint on file. Pt is accompanied by his grandson, Herbie [place].  HPI Patient was seen today for ongoing concern.  Pt states he needs a neck brace to keep his head up/ straight and decrease his pain/discomfort.  Pt endorses headaches and neck pain at the end of the day.  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  In the past pt has not taken anything for his headaches/neck pain.  Pt states he took 2 Aleve last night prior to bed.  He is currently sleeping on 3 pillows (per Herbie [place]) to make his neck comfortable.  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  In the past pt has been referred to neurology but declined to make an appointment.  PT has also been suggested.  Of note: pt not taking celebrex 2/2 cost.  Past Medical History:  Diagnosis Date  . ANEMIA, OTHER UNSPEC 08/02/2009  . B12 DEFICIENCY 07/07/2007  . Bell's palsy     around 2005  . BPPV (benign paroxysmal positional vertigo)    2003  . GERD 07/13/2007  . Gout, unspecified 06/17/2010  . HYPERLIPIDEMIA 07/13/2007  . HYPERTENSION 07/13/2007  . RENAL INSUFFICIENCY 06/04/2010    Allergies  Allergen Reactions  . Nsaids     REACTION: hx of renal insufficiency  . Statins     myalgias    ROS General: Denies fever, chills, night sweats, changes in weight, changes in appetite HEENT: Denies ear pain, changes in vision, rhinorrhea, sore throat  +HA, neck pain CV: Denies CP, palpitations, SOB, orthopnea Pulm: Denies SOB, cough, wheezing GI: Denies abdominal pain, nausea, vomiting, diarrhea, constipation GU:  Denies dysuria, hematuria, frequency, vaginal discharge Msk: Denies muscle cramps, joint pains  +shoulder pain Neuro: Denies weakness, numbness, tingling Skin: Denies rashes, bruising Psych: Denies depression, anxiety, hallucinations     Objective:    Blood pressure 100/62, pulse 62, temperature 98.4 F (36.9 C), temperature source Oral, weight 175 lb (79.4 kg), SpO2 97 %.   Gen. Pleasant, well-nourished, in no distress, normal affect   HEENT: [place]/AT, face symmetric, no scleral icterus, PERRLA, nares patent without drainage Lungs: no accessory muscle use, CTAB, no wheezes or rales Cardiovascular: RRR, no peripheral edema Musculoskeletal: No deformities, no cyanosis or clubbing, normal tone Neuro:  A&Ox3, CN II-XII intact, normal gait   Wt Readings from Last 3 Encounters:  03/08/18 175 lb (79.4 kg)  02/16/18 178 lb 11.2 oz (81.1 kg)  11/08/17 175 lb 9.6 oz (79.7 kg)    Lab Results  Component Value Date   WBC 4.4 03/30/2017   HGB 12.6 (L) 03/30/2017   HCT 38.0 (L) 03/30/2017   PLT 220.0 03/30/2017   GLUCOSE 91 11/12/2017   CHOL 203 (H) 03/30/2017   TRIG 113.0 03/30/2017   HDL 43.30 03/30/2017   LDLDIRECT 131.0 01/24/2016   LDLCALC 137 (H) 03/30/2017   ALT 19 03/30/2017   AST 24 03/30/2017   NA 130 (L) 11/12/2017   K 4.0 11/12/2017   CL 93 (L) 11/12/2017   CREATININE 1.43 (H) 11/12/2017   BUN 11 11/12/2017   CO2  30 11/12/2017   TSH 1.93 10/10/2014   PSA 3.52 03/30/2017   HGBA1C 6.2 04/19/2012    Assessment/Plan:  Chronic tension-type headache, not intractable -likely 2/2 increased stress (pt & wife take care of his grandchildren, pt still doing labor intensive jobs around the house) and ongoing neck and shoulder pain. -encouraged to stay hydrated -Pt advised it is ok to take Tylenol prn for HAs/discomfort. 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 -pt advised to work on posture -PT  recommended -pt given handout  F/u prn  Grier Mitts, MD

## 2018-03-08 NOTE — Patient Instructions (Addendum)
The referral for [facility] is still open.  All you need to do is call to schedule an appointment.  Their office number is [phone] Cervical Strain and Sprain Rehab Ask your health care provider which exercises are safe for you. Do exercises exactly as told by your health care provider and adjust them as directed. It is normal to feel mild stretching, pulling, tightness, or discomfort as you do these exercises, but you should stop right away if you feel sudden pain or your pain gets worse.Do not begin these exercises until told by your health care provider. Stretching and range of motion exercises These exercises warm up your muscles and joints and improve the movement and flexibility of your neck. These exercises also help to relieve pain, numbness, and tingling. Exercise A: Cervical side bend  1. Using good posture, sit on a stable chair or stand up. 2. Without moving your shoulders, slowly tilt your left / right ear to your shoulder until you feel a stretch in your neck muscles. You should be looking straight ahead. 3. Hold for __________ seconds. 4. Repeat with the other side of your neck. Repeat __________ times. Complete this exercise __________ times a day. Exercise B: Cervical rotation  1. Using good posture, sit on a stable chair or stand up. 2. Slowly turn your head to the side as if you are looking over your left / right shoulder. ? Keep your eyes level with the ground. ? Stop when you feel a stretch along the side and the back of your neck. 3. Hold for __________ seconds. 4. Repeat this by turning to your other side. Repeat __________ times. Complete this exercise __________ times a day. Exercise C: Thoracic extension and pectoral stretch 1. Roll a towel or a small blanket so it is about 4 inches (10 cm) in diameter. 2. Lie down on your back on a firm surface. 3. Put the towel lengthwise, under your spine in the middle of your back. It should not be not under your  shoulder blades. The towel should line up with your spine from your middle back to your lower back. 4. Put your hands behind your head and let your elbows fall out to your sides. 5. Hold for __________ seconds. Repeat __________ times. Complete this exercise __________ times a day. Strengthening exercises These exercises build strength and endurance in your neck. Endurance is the ability to use your muscles for a long time, even after your muscles get tired. Exercise D: Upper cervical flexion, isometric 1. Lie on your back with a thin pillow behind your head and a small rolled-up towel under your neck. 2. Gently tuck your chin toward your chest and nod your head down to look toward your feet. Do not lift your head off the pillow. 3. Hold for __________ seconds. 4. Release the tension slowly. Relax your neck muscles completely before you repeat this exercise. Repeat __________ times. Complete this exercise __________ times a day. Exercise E: Cervical extension, isometric  1. Stand about 6 inches (15 cm) away from a wall, with your back facing the wall. 2. Place a soft object, about 6-8 inches (15-20 cm) in diameter, between the back of your head and the wall. A soft object could be a small pillow, a ball, or a folded towel. 3. Gently tilt your head back and press into the soft object. Keep your jaw and forehead relaxed. 4. Hold for __________ seconds. 5. Release the tension slowly. Relax your neck muscles completely before you repeat  this exercise. Repeat __________ times. Complete this exercise __________ times a day. Posture and body mechanics  Body mechanics refers to the movements and positions of your body while you do your daily activities. Posture is part of body mechanics. Good posture and healthy body mechanics can help to relieve stress in your body's tissues and joints. Good posture means that your spine is in its natural S-curve position (your spine is neutral), your shoulders are  pulled back slightly, and your head is not tipped forward. The following are general guidelines for applying improved posture and body mechanics to your everyday activities. Standing  When standing, keep your spine neutral and keep your feet about hip-width apart. Keep a slight bend in your knees. Your ears, shoulders, and hips should line up.  When you do a task in which you stand in one place for a long time, place one foot up on a stable object that is 2-4 inches (5-10 cm) high, such as a footstool. This helps keep your spine neutral. Sitting   When sitting, keep your spine neutral and your keep feet flat on the floor. Use a footrest, if necessary, and keep your thighs parallel to the floor. Avoid rounding your shoulders, and avoid tilting your head forward.  When working at a desk or a computer, keep your desk at a height where your hands are slightly lower than your elbows. Slide your chair under your desk so you are close enough to maintain good posture.  When working at a computer, place your monitor at a height where you are looking straight ahead and you do not have to tilt your head forward or downward to look at the screen. Resting When lying down and resting, avoid positions that are most painful for you. Try to support your neck in a neutral position. You can use a contour pillow or a small rolled-up towel. Your pillow should support your neck but not push on it. This information is not intended to replace advice given to you by your health care provider. Make sure you discuss any questions you have with your health care provider. Document Released: 10/19/2005 Document Revised: 06/25/2016 Document Reviewed: 09/25/2015 Elsevier Interactive Patient Education  [employer].

## 2018-03-09 ENCOUNTER — Encounter: Payer: Self-pay | Admitting: Family Medicine

## 2018-03-11 ENCOUNTER — Other Ambulatory Visit: Payer: Self-pay | Admitting: Family Medicine

## 2018-03-11 DIAGNOSIS — M542 Cervicalgia: Secondary | ICD-10-CM

## 2018-03-11 DIAGNOSIS — R519 Headache, unspecified: Secondary | ICD-10-CM

## 2018-03-11 DIAGNOSIS — R51 Headache: Secondary | ICD-10-CM

## 2018-03-14 ENCOUNTER — Encounter: Payer: Self-pay | Admitting: Neurology

## 2018-03-16 ENCOUNTER — Telehealth: Payer: Self-pay | Admitting: Family Medicine

## 2018-03-16 NOTE — Telephone Encounter (Signed)
Copied from [place] [id number]. Topic: Quick Communication - See Telephone Encounter >> Mar 16, 2018 11:06 AM Ivar Drape wrote: CRM for notification. See Telephone encounter for: 03/16/18. Patient stated that his Neurology appt is not until the middle of July and he can't wait that long.  He stated his head almost killed him last night from the pain.  Please advise.

## 2018-03-16 NOTE — Telephone Encounter (Signed)
Please advise if patient can be worked in sooner and contact patient. Thanks!

## 2018-03-18 ENCOUNTER — Telehealth: Payer: Self-pay | Admitting: Family Medicine

## 2018-03-18 NOTE — Telephone Encounter (Signed)
I spoke with pt and advised him to check into other offices, can find out which place might have a sooner appointment, we have no way of knowing. Pt says that his head is really hurting and he cannot wait this long. Please advise?

## 2018-03-18 NOTE — Telephone Encounter (Signed)
Copied from [place] [phone]. Topic: Referral - Request >> Mar 18, 2018  3:12 PM Oliver Pila B wrote: Reason for CRM: pt is needing another neurologist b/c the one he was sent to cant get him in [facility] July, call pt to advise

## 2018-03-21 NOTE — Telephone Encounter (Signed)
I spoke with pt and went over advice from Dr. Volanda Napoleon, pt agreed.

## 2018-03-21 NOTE — Telephone Encounter (Signed)
Please advise patient I cannot do much about the appointment time.  This will referral was placed months ago, however patient declined the appointment at that time, and a new referral had to be placed.  Patient can be asked to place on a cancellation list and helps a sooner appointment becomes available.  He can take Tylenol as needed for his headache.  Patient is also advised to start physical therapy if he has not done so already.  If headaches continue or become worse patient is advised to go to the emergency room.

## 2018-03-30 ENCOUNTER — Telehealth: Payer: Self-pay | Admitting: Family Medicine

## 2018-03-30 NOTE — Telephone Encounter (Signed)
Copied from [place] [id number]. Topic: Quick Communication - Rx Refill/Question >> Mar 30, 2018  3:56 PM Robina Ade, Helene Kelp D wrote: Medication: celecoxib (CELEBREX) 100 MG capsule  Has the patient contacted their pharmacy? Yes but the cost is too much and needs something less expensive.  (Agent: If no, request that the patient contact the pharmacy for the refill.) (Agent: If yes, when and what did the pharmacy advise?)  Preferred Pharmacy (with phone number or street name): [id number] - [place], [place]: Please be advised that RX refills may take up to 3 business days. We ask that you follow-up with your pharmacy.

## 2018-03-31 NOTE — Telephone Encounter (Signed)
Please advise 

## 2018-04-05 NOTE — Telephone Encounter (Signed)
Pt can try to find a coupon on goodrx.com for the celebrex.

## 2018-04-06 ENCOUNTER — Ambulatory Visit (INDEPENDENT_AMBULATORY_CARE_PROVIDER_SITE_OTHER): Payer: Medicare Other | Admitting: Family Medicine

## 2018-04-06 DIAGNOSIS — E538 Deficiency of other specified B group vitamins: Secondary | ICD-10-CM | POA: Diagnosis not present

## 2018-04-06 MED ORDER — CYANOCOBALAMIN 1000 MCG/ML IJ SOLN
1000.0000 ug | Freq: Once | INTRAMUSCULAR | Status: AC
  Administered 2018-04-06: 1000 ug via INTRAMUSCULAR

## 2018-04-06 NOTE — Progress Notes (Signed)
Per orders of Dr. Banks, injection of Vitamin B 12 given by Yerik Zeringue ANN. Patient tolerated injection well. 

## 2018-04-07 NOTE — Telephone Encounter (Signed)
Wonderful. 

## 2018-04-07 NOTE — Telephone Encounter (Signed)
Patient states the pharmacy re-ran the Celebrex and he was able to afford it and picked it up last week. Also wanted PCP to know the neurology had a cancellation and is working him in tomorrow afternoon.

## 2018-04-08 ENCOUNTER — Encounter: Payer: Self-pay | Admitting: Neurology

## 2018-04-08 ENCOUNTER — Ambulatory Visit: Payer: Medicare Other | Admitting: Neurology

## 2018-04-08 VITALS — BP 112/60 | HR 63 | Ht 70.0 in | Wt 179.0 lb

## 2018-04-08 DIAGNOSIS — R51 Headache: Secondary | ICD-10-CM

## 2018-04-08 DIAGNOSIS — G4486 Cervicogenic headache: Secondary | ICD-10-CM

## 2018-04-08 MED ORDER — TIZANIDINE HCL 4 MG PO TABS: ORAL_TABLET | ORAL | 2 refills | Status: DC

## 2018-04-08 NOTE — Progress Notes (Signed)
NEUROLOGY CONSULTATION NOTE  Scott [name] MRN: [id number] DOB: Jun 08, 1946  Referring provider: Dr. Volanda Napoleon Primary care provider: Dr. Volanda Napoleon  Reason for consult:  headache  HISTORY OF PRESENT ILLNESS: Scott [name] is a 72 year old right-handed male with hypertension, hyperlipidemia, renal insufficiency and history of Bell's palsy who presents for headaches.  History supplemented by PCP notes.  Onset:  In August 2018, he fell and hit the right occipital region on concrete.  He has since had headaches.  No prior history of headaches. Location:  Right occipital/subocciptal region with right sided neck pain into the shoulder. Quality:  aching Intensity:  Mild to moderate.  He denies new headache, thunderclap headache or severe headache that wakes him from sleep. Aura:  no Prodrome:  no Postdrome:  no Associated symptoms:  No nausea, vomiting, photophobia, phonophobia, or visual disturbance.  She denies associated unilateral numbness or weakness. 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  MRI of brain with and without contrast from 11/10/17 was personally reviewed and showed chronic small vessel ischemic changes and possible tiny late subacute-chronic right frontal lacunar infarct.  Current NSAIDS:  Celebrex (back pain) Current analgesics:  Tylenol (ineffective), Tramadol (back pain) Current triptans:  no Current anti-emetic:  no Current muscle relaxants:  no Current anti-anxiolytic:  no Current sleep aide:  no Current Antihypertensive medications:  Cozaar, Maxzide-25 Current Antidepressant medications:  no Current Anticonvulsant medications:  no Current Vitamins/Herbal/Supplements:   no Current Antihistamines/Decongestants:  no Other therapy:  no  Past NSAIDS:  Aleve Past analgesics:  no Past abortive triptans:  no Past muscle relaxants:  no Past anti-emetic:  no Other past therapies:  No His PCP recommended PT, but he is concerned about the expense.  Depression:  no; Anxiety:  no Other pain:  Back pain  11/12/17 BMP:  Na 130, K 4, glucose 91, BUN, Cr 1.43 03/03/18 B12: 850.  PAST MEDICAL HISTORY: Past Medical History:  Diagnosis Date  . ANEMIA, OTHER UNSPEC 08/02/2009  . B12 DEFICIENCY 07/07/2007  . Bell's palsy     around 2005  . BPPV (benign paroxysmal positional vertigo)    2003  . GERD 07/13/2007  . Gout, unspecified 06/17/2010  . HYPERLIPIDEMIA 07/13/2007  . HYPERTENSION 07/13/2007  . RENAL INSUFFICIENCY 06/04/2010    PAST SURGICAL HISTORY: Past Surgical History:  Procedure Laterality Date  . BACK SURGERY  07/13/07    MEDICATIONS: Current Outpatient Medications on File Prior to Visit  Medication Sig Dispense Refill  . allopurinol (ZYLOPRIM) 300 MG tablet TAKE 1 TABLET BY MOUTH ONCE A DAY 90 tablet 3  . atorvastatin (LIPITOR) 20 MG tablet Take 1 tablet (20 mg total) by mouth once a week. (Patient not taking: Reported on 04/08/2018) 13 tablet 3  . celecoxib (CELEBREX) 100 MG capsule Take 1 capsule (100 mg total) by mouth 2 (two) times daily as needed for moderate pain (shoulder). 60 capsule 5  . colchicine 0.6 MG tablet TAKE 1 TABLET BY MOUTH DAILY 30 tablet 4  . cyanocobalamin 1000 MCG tablet Inject 100 mcg as directed every 30 (thirty) days.     . fluticasone (FLONASE) 50 MCG/ACT nasal spray Place 2 sprays into both nostrils daily. 16 g 1  . losartan (COZAAR) 50 MG tablet TAKE 1 TABLET BY  MOUTH EVERY DAY 90 tablet 3  . Polyethylene Glycol 3350 POWD Take 17 g by mouth daily.      . predniSONE (DELTASONE) 10 MG tablet Take 5 tabs on day 1, 4 tabs on day 2, 3 tabs on day 3, 2 tabs on day 4, 1 tab on day 5. (Patient not taking: Reported on 04/08/2018) 15  tablet 0  . predniSONE (DELTASONE) 10 MG tablet Take 4 tabs every morning for 3 days, 3 tabs for 2 days, 2 tabs for 2 days, 1 tab for 1 day. (Patient not taking: Reported on 04/08/2018) 23 tablet 0  . RABEprazole (ACIPHEX) 20 MG tablet Take 20 mg by mouth daily.      . traMADol (ULTRAM) 50 MG tablet Take 2 tablets (100 mg total) by mouth 2 (two) times daily. 120 tablet 2  . triamterene-hydrochlorothiazide (MAXZIDE-25) 37.5-25 MG tablet TAKE 1 TABLET BY MOUTH DAILY. 90 tablet 3   No current facility-administered medications on file prior to visit.     ALLERGIES: Allergies  Allergen Reactions  . Nsaids     REACTION: hx of renal insufficiency  . Statins     myalgias    FAMILY HISTORY: Family History  Problem Relation Age of Onset  . Arthritis Father     SOCIAL HISTORY: Social History   Socioeconomic History  . Marital status: Married    Spouse name: MaryAnn  . Number of children: 4  . Years of education: Not on file  . Highest education level: Some college, no degree  Occupational History    Employer: RETIRED  Social Needs  . Financial resource strain: Not on file  . Food insecurity:    Worry: Not on file    Inability: Not on file  . Transportation needs:    Medical: Not on file    Non-medical: Not on file  Tobacco Use  . Smoking status: Never Smoker  . Smokeless tobacco: Never Used  Substance and Sexual Activity  . Alcohol use: No  . Drug use: No  . Sexual activity: Not on file  Lifestyle  . Physical activity:    Days per week: Not on file    Minutes per session: Not on file  . Stress: Not on file  Relationships  . Social connections:    Talks on phone: Not on file    Gets together: Not on file    Attends religious service: Not on file    Active member of club or organization: Not on file    Attends meetings of clubs or organizations: Not on file    Relationship status: Not on file  . Intimate partner violence:    Fear of current or ex partner: Not on file     Emotionally abused: Not on file    Physically abused: Not on file    Forced sexual activity: Not on file  Other Topics Concern  . Not on file  Social History Narrative   Married 1965. 4 children. 11 grandchildren. 1 greatgrandchild on way in 2015.       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 He is married, lives with his wife. He states he stops every morning and gets a 52oz tea. He walks most days.    REVIEW OF SYSTEMS: Constitutional: No fevers, chills, or sweats, no generalized fatigue, change in appetite Eyes: No visual changes, double vision, eye pain Ear, nose and throat: No hearing loss, ear  pain, nasal congestion, sore throat Cardiovascular: No chest pain, palpitations Respiratory:  No shortness of breath at rest or with exertion, wheezes GastrointestinaI: No nausea, vomiting, diarrhea, abdominal pain, fecal incontinence Genitourinary:  No dysuria, urinary retention or frequency Musculoskeletal:  No neck pain, back pain Integumentary: No rash, pruritus, skin lesions Neurological: as above Psychiatric: No depression, insomnia, anxiety Endocrine: No palpitations, fatigue, diaphoresis, mood swings, change in appetite, change in weight, increased thirst Hematologic/Lymphatic:  No purpura, petechiae. Allergic/Immunologic: no itchy/runny eyes, nasal congestion, recent allergic reactions, rashes  PHYSICAL EXAM: Vitals:   04/08/18 1551  BP: 112/60  Pulse: 63  SpO2: 98%   General: No acute distress.  Patient appears well-groomed.  Head:  Normocephalic/atraumatic, right occipital and suboccipital tenderness Eyes:  fundi examined but not visualized Neck: supple, right paraspinal tenderness, full range of motion Back: No paraspinal tenderness Heart: regular rate and rhythm Lungs: Clear to auscultation bilaterally. Vascular: No carotid bruits. Neurological Exam: Mental status: alert and oriented to person, place, and time,  recent and remote memory intact, fund of knowledge intact, attention and concentration intact, speech fluent and not dysarthric, language intact. Cranial nerves: CN I: not tested CN II: pupils equal, round and reactive to light, visual fields intact CN III, IV, VI:  full range of motion, no nystagmus, no ptosis CN V: facial sensation intact CN VII: upper and lower face symmetric CN VIII: hearing intact CN IX, X: gag intact, uvula midline CN XI: sternocleidomastoid and trapezius muscles intact CN XII: tongue midline Bulk & Tone: normal, no fasciculations. Motor:  5/5 throughout  Sensation: temperature and vibration sensation intact. Deep Tendon Reflexes:  2+ throughout, toes downgoing.  Finger to nose testing:  Without dysmetria.  Heel to shin:  Without dysmetria.  Gait:  Normal station and stride.  Able to turnRomberg negative.  IMPRESSION: Cervicogenic headache  PLAN: 1.  Tizanidine 2 to 4mg  at bedtime.  If ineffective in 6 weeks, he will contact us and we can instead try gabapentin. 2.  Advised to reconsider PT of neck and right shoudler 3.  Limit pain relievers to no more than 2 days out of week 4.  Keep headache diary 5.  Follow up in 3 months.  Thank you for allowing me to take part in the care of this patient.  Metta Clines, DO  CC:  Grier Mitts, MD

## 2018-04-08 NOTE — Patient Instructions (Signed)
1.  Start tizanidine 4mg  tablet.  Take 1/2 tablet at bedtime.  If ineffective for neck and head pain, then take 1 full tablet at bedtime.  May cause drowsiness.  If headache and neck pain not improved in 6 weeks, contact me and I will change to a different medication. 2.  Reconsider going to physical therapy for the neck and shoulder, as it could help the headache. 3.  If possible, limit use of pain relievers such as Aleve and Tylenol to no more than 2 days out of the week to prevent rebound headache. 4.  Keep headache diary 5.  Follow up in 3 months.

## 2018-04-11 ENCOUNTER — Other Ambulatory Visit: Payer: Self-pay | Admitting: Family Medicine

## 2018-04-11 NOTE — Telephone Encounter (Signed)
Pt last OV was 6/72019 and last refill was 01/05/2018 for 120 tablets with 2 refill, pt is requesting for refills. Please Advise if ok to refill

## 2018-04-12 NOTE — Telephone Encounter (Signed)
Patient checking status, also is requesting refill on fluticasone (FLONASE) 50 MCG/ACT nasal spray. Please advise

## 2018-05-06 ENCOUNTER — Ambulatory Visit (INDEPENDENT_AMBULATORY_CARE_PROVIDER_SITE_OTHER): Payer: Medicare Other | Admitting: *Deleted

## 2018-05-06 DIAGNOSIS — E538 Deficiency of other specified B group vitamins: Secondary | ICD-10-CM | POA: Diagnosis not present

## 2018-05-06 MED ORDER — CYANOCOBALAMIN 1000 MCG/ML IJ SOLN
1000.0000 ug | Freq: Once | INTRAMUSCULAR | Status: AC
  Administered 2018-05-06: 1000 ug via INTRAMUSCULAR

## 2018-05-06 NOTE — Progress Notes (Addendum)
Per orders of Dr. Volanda Napoleon, injection of Vitamin B 12 given by Varney Daily, CMA Patient tolerated injection well.

## 2018-05-11 IMAGING — MR MR HEAD WO/W CM
13 series · 48 of 48 positions shown · IV contrast (15ml multihance)
Comparison: None.

CLINICAL DATA: Patient had a fall in [REDACTED], hit head. Headaches on
the RIGHT side, extending to the neck.

EXAM:
MRI HEAD WITHOUT AND WITH CONTRAST
TECHNIQUE: Multiplanar, multiecho pulse sequences of the brain and surrounding
structures were obtained without and with intravenous contrast.
CONTRAST:  15mL MULTIHANCE GADOBENATE DIMEGLUMINE 529 MG/ML IV SOLN

[Series 2: T1 · sagittal · 5.0mm · 0.47mm/px · 1 of 26 slices shown]
[im 1/26]
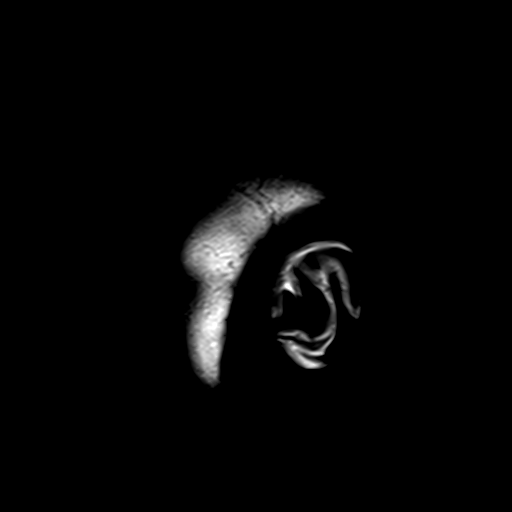

[Series 3: DWI · axial · 3.0mm · 1.88mm/px · z∈[-62,+94]mm · 6 of 106 slices shown (1 of 4)]
[im 1/106]
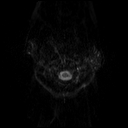
[im 22/106]
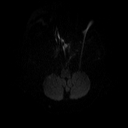
[im 43/106]
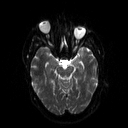
[im 64/106]
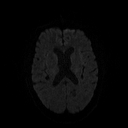
[im 85/106]
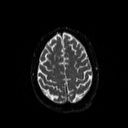
[im 106/106]
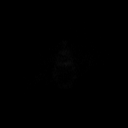

[Series 4: DWI · axial · 3.0mm · 1.88mm/px · z∈[-62,+94]mm · 3 of 50 slices shown (2 of 4)]
[im 1/50]
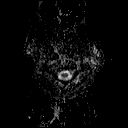
[im 25/50]
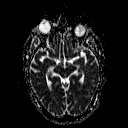
[im 50/50]
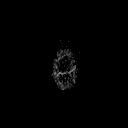

[Series 5: FLAIR · axial · 3.0mm · 0.47mm/px · z∈[-62,+94]mm · 2 of 35 slices shown]
[im 1/35]
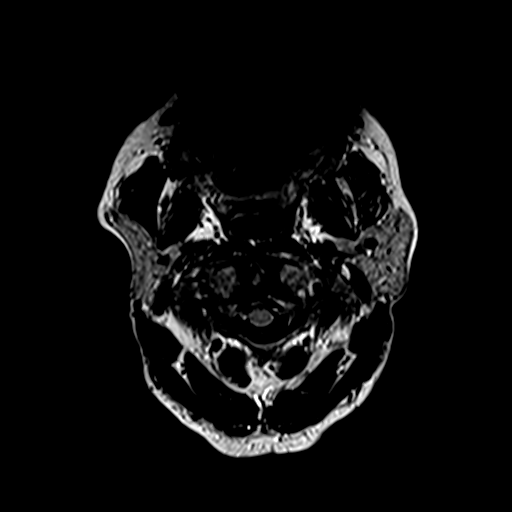
[im 35/35]
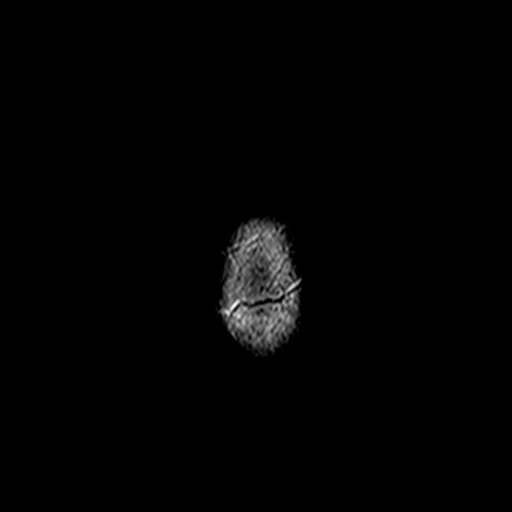

[Series 6: T2 · axial · 5.0mm · 0.62mm/px · z∈[-60,+100]mm · 2 of 25 slices shown (1 of 2)]
[im 1/25]
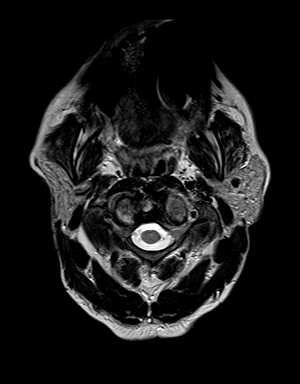
[im 25/25]
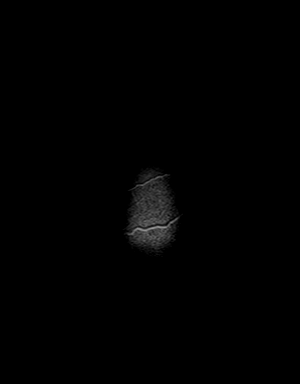

[Series 7: mip_images(sw) · axial · 40.0mm · 0.94mm/px · z∈[-43,+75]mm · 2 of 25 slices shown]
[im 1/25]
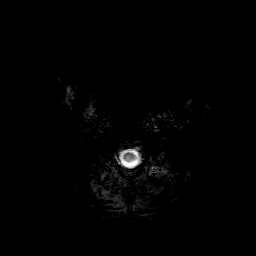
[im 25/25]
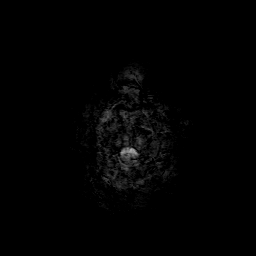

[Series 8: swi_images · axial · 5.0mm · 0.94mm/px · z∈[-60,+92]mm · 2 of 32 slices shown]
[im 1/32]
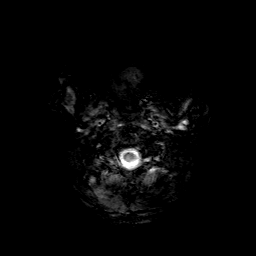
[im 32/32]
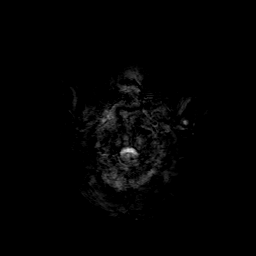

[Series 9: t1_mpr_tra · axial · 1.0mm · 0.75mm/px · z∈[-58,+98]mm · 10 of 160 slices shown (1 of 2)]
[im 1/160]
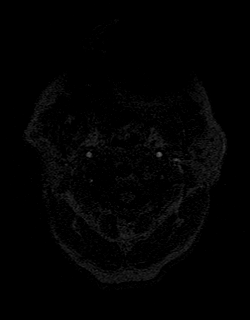
[im 18/160]
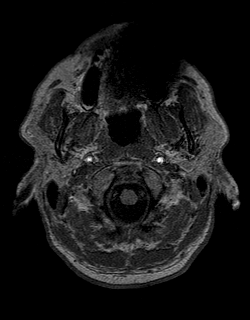
[im 36/160]
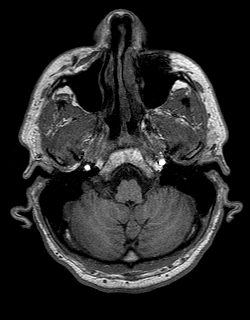
[im 54/160]
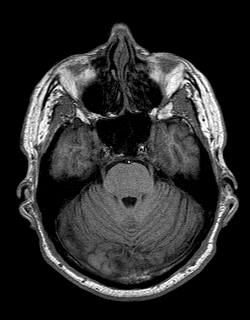
[im 71/160]
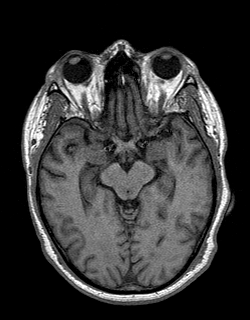
[im 89/160]
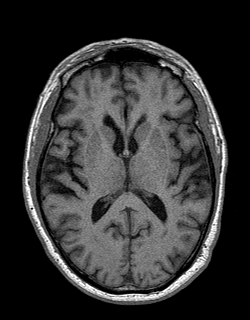
[im 107/160]
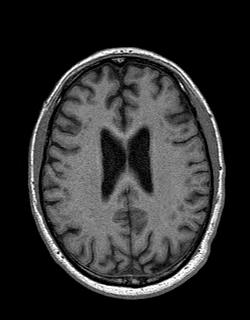
[im 124/160]
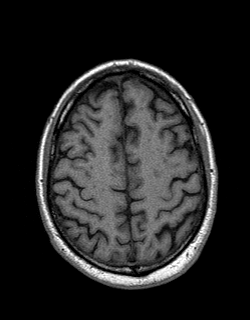
[im 142/160]
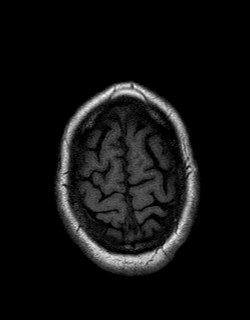
[im 160/160]
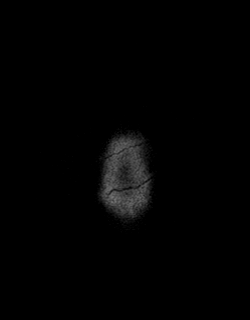

[Series 10: DWI · coronal · 5.0mm · 1.80mm/px · 4 of 74 slices shown (3 of 4)]
[im 1/74]
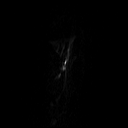
[im 25/74]
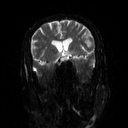
[im 49/74]
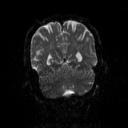
[im 74/74]
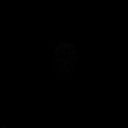

[Series 11: DWI · coronal · 5.0mm · 1.80mm/px · 2 of 40 slices shown (4 of 4)]
[im 1/40]
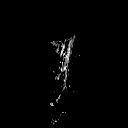
[im 40/40]
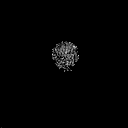

[Series 12: T2 · coronal · 5.0mm · 0.45mm/px · 2 of 31 slices shown (2 of 2)]
[im 1/31]
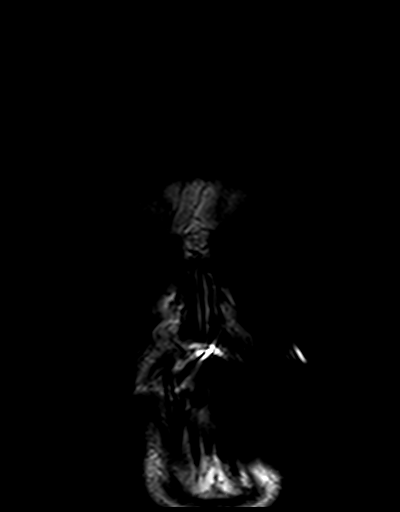
[im 31/31]
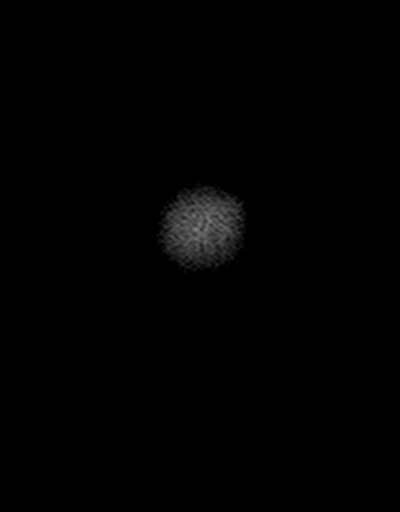

[Series 13: t1_mpr_tra · axial · 1.0mm · 0.75mm/px · z∈[-58,+98]mm · 10 of 160 slices shown (2 of 2)]
[im 1/160]
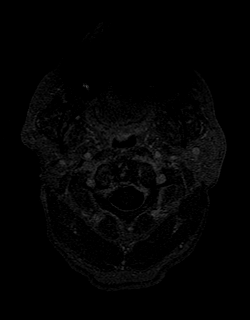
[im 18/160]
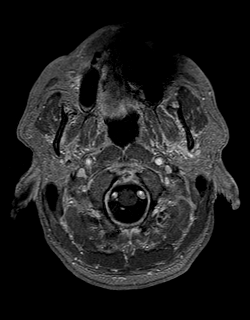
[im 36/160]
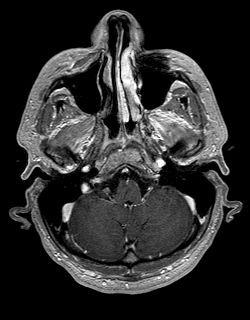
[im 54/160]
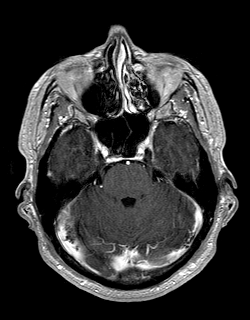
[im 71/160]
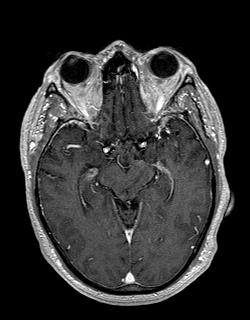
[im 89/160]
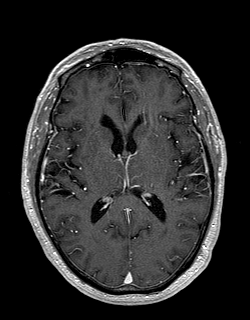
[im 107/160]
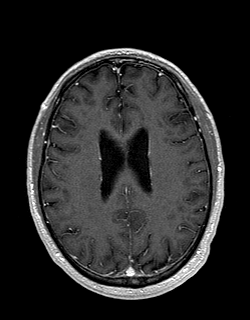
[im 124/160]
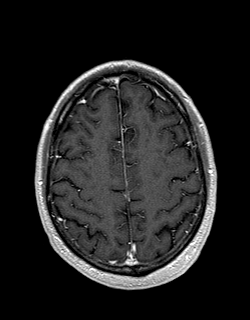
[im 142/160]
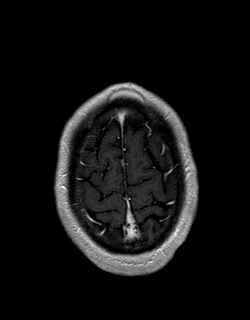
[im 160/160]
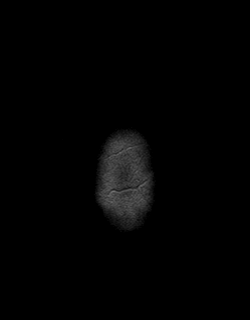

[Series 14: post cor · coronal · 5.0mm · 0.45mm/px · 2 of 31 slices shown]
[im 1/31]
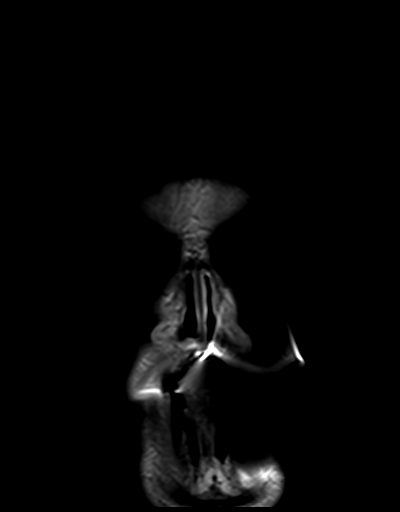
[im 31/31]
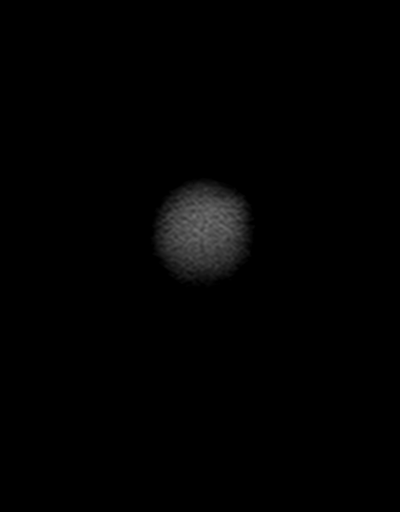

[48 of 48 positions shown; findings below may reference images not displayed]

FINDINGS: Brain: No acute stroke, acute hemorrhage, mass lesion,
hydrocephalus, or extra-axial fluid. T2 shine through is associated
with a tiny late subacute to chronic lacunar infarct, RIGHT
posterior frontal subcortical white matter, no postcontrast
enhancement.

Normal for age cerebral volume. Moderately extensive T2 and FLAIR
hyperintensities throughout the white matter, likely small vessel
disease.

Vascular: Flow voids are maintained throughout the carotid, basilar,
and vertebral arteries. There are no areas of chronic hemorrhage.

Skull and upper cervical spine: Mild pannus. Normal marrow signal.
Spondylosis with disc space narrowing.

Sinuses/Orbits: No layering fluid.  Unremarkable orbits.

Other: Mastoid air cells are clear. Extracranial soft tissues
unremarkable.
IMPRESSION: No posttraumatic sequelae are evident. No abnormal postcontrast
enhancement. No cause is seen for the patient's headaches.

Normal for age cerebral volume with moderately extensive small
vessel disease. Late subacute to chronic RIGHT posterior frontal
lacunar infarct.

## 2018-05-16 ENCOUNTER — Ambulatory Visit: Payer: Medicare Other | Admitting: Neurology

## 2018-05-16 ENCOUNTER — Encounter

## 2018-06-06 ENCOUNTER — Ambulatory Visit (INDEPENDENT_AMBULATORY_CARE_PROVIDER_SITE_OTHER): Payer: Medicare Other | Admitting: Family Medicine

## 2018-06-06 DIAGNOSIS — E538 Deficiency of other specified B group vitamins: Secondary | ICD-10-CM

## 2018-06-06 MED ORDER — CYANOCOBALAMIN 1000 MCG/ML IJ SOLN
1000.0000 ug | Freq: Once | INTRAMUSCULAR | Status: AC
  Administered 2018-06-06: 1000 ug via INTRAMUSCULAR

## 2018-06-06 NOTE — Progress Notes (Signed)
Per orders of Dr. Volanda Napoleon injection of Vitamin B 12 given by Aggie Hacker ANN. Patient tolerated injection well.  Pt requesting to continue with injections once a month.

## 2018-06-09 ENCOUNTER — Telehealth: Payer: Self-pay | Admitting: Family Medicine

## 2018-06-09 MED ORDER — LOSARTAN POTASSIUM 50 MG PO TABS: 50.0000 mg | ORAL_TABLET | Freq: Every day | ORAL | 0 refills | Status: DC

## 2018-06-09 NOTE — Telephone Encounter (Signed)
Copied from [place] [id number]. Topic: Quick Communication - Rx Refill/Question >> Jun 09, 2018 10:51 AM Cecelia Byars, NT wrote: Medication: losartan (COZAAR) 50 MG tablet  Has the patient contacted their pharmacy? yes  (Agent: If no, request that the patient contact the pharmacy for the refill. (Agent: If yes, when and what did the pharmacy advise?  Preferred Pharmacy (with phone number or street name  [id number] - [place], [place] [phone] (Phone) [phone] (Fax)    Agent: Please be advised that RX refills may take up to 3 business days. We ask that you follow-up with your pharmacy.

## 2018-06-15 ENCOUNTER — Encounter: Payer: Self-pay | Admitting: Family Medicine

## 2018-06-15 ENCOUNTER — Ambulatory Visit (INDEPENDENT_AMBULATORY_CARE_PROVIDER_SITE_OTHER): Payer: Medicare Other | Admitting: Family Medicine

## 2018-06-15 VITALS — BP 110/76 | HR 78 | Temp 97.8°F | Wt 176.0 lb

## 2018-06-15 DIAGNOSIS — R7303 Prediabetes: Secondary | ICD-10-CM

## 2018-06-15 DIAGNOSIS — G629 Polyneuropathy, unspecified: Secondary | ICD-10-CM

## 2018-06-15 LAB — BASIC METABOLIC PANEL
BUN: 11 mg/dL (ref 6–23)
CALCIUM: 9.5 mg/dL (ref 8.4–10.5)
CO2: 31 mEq/L (ref 19–32)
Chloride: 94 mEq/L — ABNORMAL LOW (ref 96–112)
Creatinine, Ser: 1.46 mg/dL (ref 0.40–1.50)
GFR: 60.62 mL/min (ref >60.00)
GLUCOSE: 82 mg/dL (ref 70–99)
POTASSIUM: 4.1 meq/L (ref 3.5–5.1)
Sodium: 130 mEq/L — ABNORMAL LOW (ref 135–145)

## 2018-06-15 LAB — CBC
HCT: 36.1 % — ABNORMAL LOW (ref 39.0–52.0)
Hemoglobin: 12 g/dL — ABNORMAL LOW (ref 13.0–17.0)
MCHC: 33.1 g/dL (ref 30.0–36.0)
MCV: 85.6 fl (ref 78.0–100.0)
PLATELETS: 208 10*3/uL (ref 150.0–400.0)
RBC: 4.22 Mil/uL (ref 4.22–5.81)
RDW: 15.9 % — ABNORMAL HIGH (ref 11.5–15.5)
WBC: 4.9 10*3/uL (ref 4.0–10.5)

## 2018-06-15 LAB — FOLATE: FOLATE: 8.3 ng/mL (ref >5.9)

## 2018-06-15 LAB — VITAMIN B12: Vitamin B-12: 781 pg/mL (ref 211–911)

## 2018-06-15 LAB — HEMOGLOBIN A1C: Hgb A1c MFr Bld: 6.2 % (ref 4.6–6.5)

## 2018-06-15 NOTE — Progress Notes (Signed)
Subjective:    Patient ID: Scott [name], male    DOB: [birth year], 72 y.o.   MRN: [id number]  No chief complaint on file.   HPI Patient was seen today for ongoing concern.  Pt endorses numbness in bilateral hands.  Pt also notes fingers feeling cold at times.  Symptoms x 1 month or more. Pt may notice the feeling when he wakes up.  It initially started in the tip of his left third digit, now in all the fingers of L hand, not in palm.  Occasionally in the right hand.  Denies wrist, shoulder pain/numbness/tingling.  Pt receiving b12 injections monthly.  B12 checked on 03/03/18 and was normal.  H/o neck pain, improved since back on Celebrex 100 mg.  Past Medical History:  Diagnosis Date  . ANEMIA, OTHER UNSPEC 08/02/2009  . B12 DEFICIENCY 07/07/2007  . Bell's palsy     around 2005  . BPPV (benign paroxysmal positional vertigo)    2003  . GERD 07/13/2007  . Gout, unspecified 06/17/2010  . HYPERLIPIDEMIA 07/13/2007  . HYPERTENSION 07/13/2007  . RENAL INSUFFICIENCY 06/04/2010    Allergies  Allergen Reactions  . Nsaids     REACTION: hx of renal insufficiency  . Statins     myalgias    ROS General: Denies fever, chills, night sweats, changes in weight, changes in appetite HEENT: Denies headaches, ear pain, changes in vision, rhinorrhea, sore throat CV: Denies CP, palpitations, SOB, orthopnea Pulm: Denies SOB, cough, wheezing GI: Denies abdominal pain, nausea, vomiting, diarrhea, constipation GU: Denies dysuria, hematuria, frequency, vaginal discharge Msk: Denies muscle cramps, joint pains Neuro: Denies weakness, tingling  + numbness Skin: Denies rashes, bruising Psych: Denies depression, anxiety, hallucinations     Objective:    Blood pressure 110/76, pulse 78, temperature 97.8 F (36.6 C), temperature source Oral, weight 176 lb (79.8 kg), SpO2 98 %.   Gen. Pleasant, well-nourished, in no distress, normal affect   HEENT: Bailey/AT, face symmetric, no scleral icterus, PERRLA, nares  patent without drainage Lungs: no accessory muscle use Cardiovascular: RRR Musculoskeletal: No deformities, no cyanosis or clubbing, normal tone.  Negative Tinel and Phalen's.  No numbness with tapping of lateral and medial epicondyles bilaterally.  No numbness and tingling to bilateral arms with cervical compression. Neuro:  A&Ox3, CN II-XII intact, normal gait   Wt Readings from Last 3 Encounters:  06/15/18 176 lb (79.8 kg)  04/08/18 179 lb (81.2 kg)  03/08/18 175 lb (79.4 kg)    Lab Results  Component Value Date   WBC 4.4 03/30/2017   HGB 12.6 (L) 03/30/2017   HCT 38.0 (L) 03/30/2017   PLT 220.0 03/30/2017   GLUCOSE 91 11/12/2017   CHOL 203 (H) 03/30/2017   TRIG 113.0 03/30/2017   HDL 43.30 03/30/2017   LDLDIRECT 131.0 01/24/2016   LDLCALC 137 (H) 03/30/2017   ALT 19 03/30/2017   AST 24 03/30/2017   NA 130 (L) 11/12/2017   K 4.0 11/12/2017   CL 93 (L) 11/12/2017   CREATININE 1.43 (H) 11/12/2017   BUN 11 11/12/2017   CO2 30 11/12/2017   TSH 1.93 10/10/2014   PSA 3.52 03/30/2017   HGBA1C 6.2 04/19/2012    Assessment/Plan:  Neuropathy -Given handout -We will obtain labs -Consider follow-up with neurology if symptoms continue. - Plan: Hemoglobin A1c, Vitamin O17, Basic metabolic panel, Folate, CBC (no diff)  Follow-up PRN  Grier Mitts, MD

## 2018-06-15 NOTE — Patient Instructions (Signed)
Peripheral Neuropathy Peripheral neuropathy is a type of nerve damage. It affects nerves that carry signals between the spinal cord and other parts of the body. These are called peripheral nerves. With peripheral neuropathy, one nerve or a group of nerves may be damaged. What are the causes? Many things can damage peripheral nerves. For some people with peripheral neuropathy, the cause is unknown. Some causes include:  Diabetes. This is the most common cause of peripheral neuropathy.  Injury to a nerve.  Pressure or stress on a nerve that lasts a long time.  Too little vitamin B. Alcoholism can lead to this.  Infections.  Autoimmune diseases, such as multiple sclerosis and systemic lupus erythematosus.  Inherited nerve diseases.  Some medicines, such as cancer drugs.  Toxic substances, such as lead and mercury.  Too little blood flowing to the legs.  Kidney disease.  Thyroid disease.  What are the signs or symptoms? Different people have different symptoms. The symptoms you have will depend on which of your nerves is damaged. Common symptoms include:  Loss of feeling (numbness) in the feet and hands.  Tingling in the feet and hands.  Pain that burns.  Very sensitive skin.  Weakness.  Not being able to move a part of the body (paralysis).  Muscle twitching.  Clumsiness or poor coordination.  Loss of balance.  Not being able to control your bladder.  Feeling dizzy.  Sexual problems.  How is this diagnosed? Peripheral neuropathy is a symptom, not a disease. Finding the cause of peripheral neuropathy can be hard. To figure that out, your health care provider will take a medical history and do a physical exam. A neurological exam will also be done. This involves checking things affected by your brain, spinal cord, and nerves (nervous system). For example, your health care provider will check your reflexes, how you move, and what you can feel. Other types of tests  may also be ordered, such as:  Blood tests.  A test of the fluid in your spinal cord.  Imaging tests, such as CT scans or an MRI.  Electromyography (EMG). This test checks the nerves that control muscles.  Nerve conduction velocity tests. These tests check how fast messages pass through your nerves.  Nerve biopsy. A small piece of nerve is removed. It is then checked under a microscope.  How is this treated?  Medicine is often used to treat peripheral neuropathy. Medicines may include: ? Pain-relieving medicines. Prescription or over-the-counter medicine may be suggested. ? Antiseizure medicine. This may be used for pain. ? Antidepressants. These also may help ease pain from neuropathy. ? Lidocaine. This is a numbing medicine. You might wear a patch or be given a shot. ? Mexiletine. This medicine is typically used to help control irregular heart rhythms.  Surgery. Surgery may be needed to relieve pressure on a nerve or to destroy a nerve that is causing pain.  Physical therapy to help movement.  Assistive devices to help movement. Follow these instructions at home:  Only take over-the-counter or prescription medicines as directed by your health care provider. Follow the instructions carefully for any given medicines. Do not take any other medicines without first getting approval from your health care provider.  If you have diabetes, work closely with your health care provider to keep your blood sugar under control.  If you have numbness in your feet: ? Check every day for signs of injury or infection. Watch for redness, warmth, and swelling. ? Wear padded socks and comfortable   shoes. These help protect your feet.  Do not do things that put pressure on your damaged nerve.  Do not smoke. Smoking keeps blood from getting to damaged nerves.  Avoid or limit alcohol. Too much alcohol can cause a lack of B vitamins. These vitamins are needed for healthy nerves.  Develop a good  support system. Coping with peripheral neuropathy can be stressful. Talk to a mental health specialist or join a support group if you are struggling.  Follow up with your health care provider as directed. Contact a health care provider if:  You have new signs or symptoms of peripheral neuropathy.  You are struggling emotionally from dealing with peripheral neuropathy.  You have a fever. Get help right away if:  You have an injury or infection that is not healing.  You feel very dizzy or begin vomiting.  You have chest pain.  You have trouble breathing. This information is not intended to replace advice given to you by your health care provider. Make sure you discuss any questions you have with your health care provider. Document Released: 10/09/2002 Document Revised: 03/26/2016 Document Reviewed: 06/26/2013 Elsevier Interactive Patient Education  2017 [employer]  

## 2018-07-07 ENCOUNTER — Ambulatory Visit (INDEPENDENT_AMBULATORY_CARE_PROVIDER_SITE_OTHER): Payer: Medicare Other | Admitting: Family Medicine

## 2018-07-07 DIAGNOSIS — E538 Deficiency of other specified B group vitamins: Secondary | ICD-10-CM | POA: Diagnosis not present

## 2018-07-07 MED ORDER — CYANOCOBALAMIN 1000 MCG/ML IJ SOLN
1000.0000 ug | Freq: Once | INTRAMUSCULAR | Status: AC
  Administered 2018-07-07: 1000 ug via INTRAMUSCULAR

## 2018-07-07 NOTE — Progress Notes (Signed)
Per orders of Dr. Banks, injection of Vitamin B 12 given by NIMMONS, SYLVIA ANN. Patient tolerated injection well. 

## 2018-07-08 ENCOUNTER — Other Ambulatory Visit: Payer: Self-pay

## 2018-07-08 ENCOUNTER — Telehealth: Payer: Self-pay | Admitting: Family Medicine

## 2018-07-08 MED ORDER — TRIAMTERENE-HCTZ 37.5-25 MG PO TABS: 1.0000 | ORAL_TABLET | Freq: Every day | ORAL | 3 refills | Status: DC

## 2018-07-08 NOTE — Telephone Encounter (Signed)
Copied from [id number]. Topic: Quick Communication - Rx Refill/Question >> Jul 08, 2018  8:52 AM Mylinda Latina, NT wrote: Medication: triamterene-hydrochlorothiazide (MAXZIDE-25) 37.5-25 MG tablet  Has the patient contacted their pharmacy? Yes.   (Agent: If no, request that the patient contact the pharmacy for the refill.) (Agent: If yes, when and what did the pharmacy advise?)  Preferred Pharmacy (with phone number or street name): [id number] - [place], [place] [phone] (Phone) [phone] (Fax)  Patient is requesting a call back in reference to this medication CB#[phone] or [phone]  Agent: Please be advised that RX refills may take up to 3 business days. We ask that you follow-up with your pharmacy.

## 2018-07-08 NOTE — Telephone Encounter (Signed)
Rx has been sent to pt pharmacy for refills, pt is aware

## 2018-07-08 NOTE — Telephone Encounter (Signed)
Contacted pt per his request; he would like to have his triamterene-hctz refilled; he also also says that the pharmacy sent request to office last Thursday but still has not received a response; explained to pt that his last office visit for this was 03/30/17; further explained that even though he had been in the office at other times, these visits did not address his hypertension; explained to the pt that it is necessary for him to schedule an appointment to see Dr Volanda Napoleon to and his medication can be refilled; he refused stating that he had been seen in the office since then; will route to office for notification of this encounter.

## 2018-07-12 ENCOUNTER — Telehealth: Payer: Self-pay | Admitting: Family Medicine

## 2018-07-12 NOTE — Telephone Encounter (Signed)
Pt last OV was 06/15/2018 and last refill wa 04/11/2018 for 60 tablets with 2 refills, please Advice if ok to refill

## 2018-07-12 NOTE — Telephone Encounter (Signed)
Pt in the office needing a refill on Tramadol 50 MG   Pharm:  [facility].  Pt would like to have it before Friday will be going out of town.

## 2018-07-14 ENCOUNTER — Other Ambulatory Visit: Payer: Self-pay

## 2018-07-14 ENCOUNTER — Other Ambulatory Visit: Payer: Self-pay | Admitting: Family Medicine

## 2018-07-14 DIAGNOSIS — J309 Allergic rhinitis, unspecified: Secondary | ICD-10-CM

## 2018-07-14 MED ORDER — FLUTICASONE PROPIONATE 50 MCG/ACT NA SUSP: 2.0000 | Freq: Every day | NASAL | 3 refills | Status: DC

## 2018-07-14 MED ORDER — TRAMADOL HCL 50 MG PO TABS: 100.0000 mg | ORAL_TABLET | Freq: Two times a day (BID) | ORAL | 1 refills | Status: DC

## 2018-07-14 NOTE — Telephone Encounter (Signed)
Refill done.  

## 2018-07-22 ENCOUNTER — Other Ambulatory Visit: Payer: Self-pay

## 2018-07-22 MED ORDER — ALLOPURINOL 300 MG PO TABS: 300.0000 mg | ORAL_TABLET | Freq: Every day | ORAL | 3 refills | Status: DC

## 2018-08-02 NOTE — Progress Notes (Signed)
NEUROLOGY FOLLOW UP OFFICE NOTE  TEGAN BURNSIDE [id number]  HISTORY OF PRESENT ILLNESS: TAZ VANNESS is a 72 year old right-handed male with hypertension, hyperlipidemia, renal insufficiency and history of Bell's palsy who follows up for cervicogenic headache.    UPDATE: No recent headaches. Current NSAIDS:  Celebrex (back pain) Current analgesics:  Tylenol (ineffective), tramadol (back pain) Current triptans:  no Current ergotamine:  no Current anti-emetic:  no Current muscle relaxants:  Tizanidine 2 to 4 mg at bedtime 2 to 3 times a week. Current anti-anxiolytic:  no Current sleep aide:  no Current Antihypertensive medications:  Cozaar, Maxzide-25 Current Antidepressant medications:  no Current Anticonvulsant medications:  no Current anti-CGRP:  no Current Vitamins/Herbal/Supplements:  no Current Antihistamines/Decongestants:  no Other therapy:  no  Depression:  no; Anxiety:  no Other pain:  Back pain  He also notes new problem.  For over a year, he has had numbness in both hands but no pain or weakness.  If he wakes up at night, his right hand hurts.  No associated radicular pain or weakness of the arms or hands.  B12 from 06/15/18 was 781.  Hgb A1c 6.2.  HISTORY: Onset:  In August 2018, he fell and hit the right occipital region on concrete.  He has since had headaches.  No prior history of headaches. Location:  Right occipital/subocciptal region with right sided neck pain into the shoulder. Quality:  aching Intensity:  Mild to moderate.  He denies new headache, thunderclap headache or severe headache that wakes him from sleep. Aura:  no Prodrome:  no Postdrome:  no Associated symptoms:  None.  No nausea, vomiting, photophobia, phonophobia, or visual disturbance.  She denies associated unilateral numbness or weakness. 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  MRI of brain with and without contrast from 11/10/17 was personally reviewed and showed chronic small vessel ischemic changes and possible tiny late subacute-chronic right frontal lacunar infarct.  Current NSAIDS:  Celebrex (back pain) Current analgesics:  Tylenol (ineffective), Tramadol (back pain) Current triptans:  no Current anti-emetic:  no Current muscle relaxants:  no Current anti-anxiolytic:  no Current sleep aide:  no Current Antihypertensive medications:  Cozaar, Maxzide-25 Current Antidepressant medications:  no Current Anticonvulsant medications:  no Current Vitamins/Herbal/Supplements:  no Current Antihistamines/Decongestants:  no Other therapy:  no  Past NSAIDS:  Aleve Past analgesics:  no Past abortive triptans:  no Past muscle relaxants:  no Past anti-emetic:  no Other past therapies:  No His PCP recommended PT, but he is concerned about the expense.  Depression:  no; Anxiety:  no Other pain:  Back pain  PAST MEDICAL HISTORY: Past Medical History:  Diagnosis Date  . ANEMIA, OTHER UNSPEC 08/02/2009  . B12 DEFICIENCY 07/07/2007  . Bell's palsy     around 2005  . BPPV (benign paroxysmal positional vertigo)    2003  . GERD 07/13/2007  . Gout, unspecified 06/17/2010  . HYPERLIPIDEMIA 07/13/2007  . HYPERTENSION 07/13/2007  . RENAL INSUFFICIENCY 06/04/2010    MEDICATIONS: Current Outpatient Medications on File Prior to Visit  Medication Sig Dispense Refill  . allopurinol (ZYLOPRIM) 300 MG tablet Take 1 tablet (300 mg total) by mouth daily. 90 tablet 3  . atorvastatin (LIPITOR) 20 MG tablet Take 1 tablet (20 mg total)  by mouth once a week. 13 tablet 3  . celecoxib (CELEBREX) 100 MG capsule Take 1 capsule (100 mg total) by mouth 2 (two) times daily as needed for moderate pain (shoulder). 60  capsule 5  . colchicine 0.6 MG tablet TAKE 1 TABLET BY MOUTH DAILY 30 tablet 4  . cyanocobalamin 1000 MCG tablet Inject 100 mcg as directed every 30 (thirty) days.     . fluticasone (FLONASE) 50 MCG/ACT nasal spray Place 2 sprays into both nostrils daily. 16 g 3  . losartan (COZAAR) 50 MG tablet Take 1 tablet (50 mg total) by mouth daily. 90 tablet 0  . Polyethylene Glycol 3350 POWD Take 17 g by mouth daily.      . predniSONE (DELTASONE) 10 MG tablet Take 5 tabs on day 1, 4 tabs on day 2, 3 tabs on day 3, 2 tabs on day 4, 1 tab on day 5. 15 tablet 0  . predniSONE (DELTASONE) 10 MG tablet Take 4 tabs every morning for 3 days, 3 tabs for 2 days, 2 tabs for 2 days, 1 tab for 1 day. 23 tablet 0  . RABEprazole (ACIPHEX) 20 MG tablet Take 20 mg by mouth daily.      Marland Kitchen tiZANidine (ZANAFLEX) 4 MG tablet Take 0.5 tablet to 1 tablet at bedtime 30 tablet 2  . traMADol (ULTRAM) 50 MG tablet Take 2 tablets (100 mg total) by mouth 2 (two) times daily. 120 tablet 1  . triamterene-hydrochlorothiazide (MAXZIDE-25) 37.5-25 MG tablet Take 1 tablet by mouth daily. 90 tablet 3   No current facility-administered medications on file prior to visit.     ALLERGIES: Allergies  Allergen Reactions  . Nsaids     REACTION: hx of renal insufficiency  . Statins     myalgias    FAMILY HISTORY: Family History  Problem Relation Age of Onset  . Arthritis Father     SOCIAL HISTORY: Social History   Socioeconomic History  . Marital status: Married    Spouse name: MaryAnn  . Number of children: 4  . Years of education: Not on file  . Highest education level: Some college, no degree  Occupational History    Employer: RETIRED  Social Needs  . Financial resource strain: Not on file  . Food insecurity:    Worry: Not on file    Inability: Not on file  . Transportation needs:    Medical: Not on file    Non-medical: Not on file  Tobacco Use  . Smoking status: Never Smoker  . Smokeless tobacco: Never Used    Substance and Sexual Activity  . Alcohol use: No  . Drug use: No  . Sexual activity: Not on file  Lifestyle  . Physical activity:    Days per week: Not on file    Minutes per session: Not on file  . Stress: Not on file  Relationships  . Social connections:    Talks on phone: Not on file    Gets together: Not on file    Attends religious service: Not on file    Active member of club or organization: Not on file    Attends meetings of clubs or organizations: Not on file    Relationship status: Not on file  . Intimate partner violence:    Fear of current or ex partner: Not on file    Emotionally abused: Not on file    Physically abused: Not on file    Forced sexual activity: Not on file  Other  Topics Concern  . Not on file  Social History Narrative   Married 1965. 4 children. 11 grandchildren. 1 greatgrandchild on way in 2015.       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 He is married, lives with his wife. He states he stops every morning and gets a 52oz tea. He walks most days.    REVIEW OF SYSTEMS: Constitutional: No fevers, chills, or sweats, no generalized fatigue, change in appetite Eyes: No visual changes, double vision, eye pain Ear, nose and throat: No hearing loss, ear pain, nasal congestion, sore throat Cardiovascular: No chest pain, palpitations Respiratory:  No shortness of breath at rest or with exertion, wheezes GastrointestinaI: No nausea, vomiting, diarrhea, abdominal pain, fecal incontinence Genitourinary:  No dysuria, urinary retention or frequency Musculoskeletal:  No neck pain, back pain Integumentary: No rash, pruritus, skin lesions Neurological: as above Psychiatric: No depression, insomnia, anxiety Endocrine: No palpitations, fatigue, diaphoresis, mood swings, change in appetite, change in weight, increased thirst Hematologic/Lymphatic:  No purpura, petechiae. Allergic/Immunologic: no itchy/runny eyes,  nasal congestion, recent allergic reactions, rashes  PHYSICAL EXAM: Blood pressure 106/68, pulse 67, height 5\' 10"  (1.778 m), weight 177 lb (80.3 kg), SpO2 98 %. General: No acute distress.  Patient appears well-groomed.   Head:  Normocephalic/atraumatic Eyes:  Fundi examined but not visualized Neck: supple, no paraspinal tenderness, full range of motion Heart:  Regular rate and rhythm Lungs:  Clear to auscultation bilaterally Back: No paraspinal tenderness Neurological Exam: alert and oriented to person, place, and time. Attention span and concentration intact, recent and remote memory intact, fund of knowledge intact.  Speech fluent and not dysarthric, language intact.  CN II-XII intact. Bulk and tone normal, muscle strength 5/5 throughout.  Sensation to pinprick reduced in the right thumb and index finger.  Tinel's and Phalen's negative.  Deep tendon reflexes 2+ throughout, toes downgoing.  Finger to nose testing intact.  Gait normal  IMPRESSION: Cervicogenic headache, stable Bilateral hand numbness, possibly carpal tunnel syndrome (left worse than right)  PLAN: 1.  NCV-EMG of upper extremities.  Further recommendations pending results 2.  Tizanidine as needed 3.  Follow up in 6 months.  Metta Clines, DO  CC: Grier Mitts, MD

## 2018-08-03 ENCOUNTER — Ambulatory Visit: Payer: Medicare Other | Admitting: Neurology

## 2018-08-03 ENCOUNTER — Encounter: Payer: Self-pay | Admitting: Neurology

## 2018-08-03 VITALS — BP 106/68 | HR 67 | Ht 70.0 in | Wt 177.0 lb

## 2018-08-03 DIAGNOSIS — R51 Headache: Secondary | ICD-10-CM | POA: Diagnosis not present

## 2018-08-03 DIAGNOSIS — G4486 Cervicogenic headache: Secondary | ICD-10-CM

## 2018-08-03 DIAGNOSIS — R200 Anesthesia of skin: Secondary | ICD-10-CM | POA: Diagnosis not present

## 2018-08-03 NOTE — Patient Instructions (Signed)
1.  We will set you up for nerve study 2.  In the meantime, wear wrist splints on both wrists (especially at night) 3.  Follow up in 6 months.

## 2018-08-05 ENCOUNTER — Ambulatory Visit (INDEPENDENT_AMBULATORY_CARE_PROVIDER_SITE_OTHER): Payer: Medicare Other

## 2018-08-05 DIAGNOSIS — E538 Deficiency of other specified B group vitamins: Secondary | ICD-10-CM | POA: Diagnosis not present

## 2018-08-05 MED ORDER — CYANOCOBALAMIN 1000 MCG/ML IJ SOLN
1000.0000 ug | Freq: Once | INTRAMUSCULAR | Status: AC
  Administered 2018-08-05: 1000 ug via INTRAMUSCULAR

## 2018-08-05 NOTE — Progress Notes (Signed)
Per orders of Dr. Volanda Napoleon, injection of B12 given by Rebecca Eaton. Patient tolerated injection well.

## 2018-08-21 ENCOUNTER — Other Ambulatory Visit: Payer: Self-pay | Admitting: Neurology

## 2018-09-01 ENCOUNTER — Ambulatory Visit (INDEPENDENT_AMBULATORY_CARE_PROVIDER_SITE_OTHER): Payer: Medicare Other | Admitting: Neurology

## 2018-09-01 DIAGNOSIS — G5603 Carpal tunnel syndrome, bilateral upper limbs: Secondary | ICD-10-CM

## 2018-09-01 DIAGNOSIS — G5623 Lesion of ulnar nerve, bilateral upper limbs: Secondary | ICD-10-CM

## 2018-09-01 DIAGNOSIS — R200 Anesthesia of skin: Secondary | ICD-10-CM | POA: Diagnosis not present

## 2018-09-01 NOTE — Procedures (Signed)
[facility]  [place], [place]  [place], [place] [address] Tel: [phone] Fax:  [phone] Test Date:  09/01/2018  Patient: Scott [name] DOB: October 18, 1946 Physician: Narda Amber, DO  Sex: Male Height: 5\' 10"  Ref Phys: Metta Clines, DO  ID#: [id number] Temp: 33.0C Technician:    Patient Complaints: This is a 72 year old man referred for evaluation of bilateral hand numbness and tingling.  NCV & EMG Findings: Extensive electrodiagnostic testing of the right upper extremity and additional studies of the left shows:  1. Bilateral median sensory responses are absent.  Left ulnar sensory response shows mildly prolonged latency.  Right ulnar sensory response is within normal limits. 2. Bilateral median motor responses shows severe prolonged distal onset latency (L10.2, R7.0 ms) and reduced amplitude (L4.7, R4.5 mV).  Bilateral ulnar motor responses show conduction velocity slowing across the elbow (A Elbow-B Elbow, L43, R45 m/s).   3. Chronic motor axonal loss changes are seen affecting bilateral abductor pollicis brevis (severe) and ulnar innervated muscles (mild).  Additionally, sparse chronic motor axonal loss changes are seen in the right deltoid and infraspinatus muscles.  There is no evidence of accompanied active denervation involving any of the tested muscles.  Impression: 1. Bilateral median neuropathy at or distal to the wrist, consistent with the clinical diagnosis of carpal tunnel syndrome.  Overall, these findings are very severe in degree electrically. 2. Bilateral ulnar neuropathy with slowing across the elbow, mild in degree electrically. 3. 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   ___________________________ Narda Amber, DO    Nerve Conduction Studies Anti Sensory Summary Table   Site NR Peak (ms) Norm Peak (ms) P-T Amp (V) Norm P-T Amp  Left Median Anti Sensory (2nd Digit)  33C  Wrist NR  <3.8  >10  Right Median  Anti Sensory (2nd Digit)  33C  Wrist NR  <3.8  >10  Left Ulnar Anti Sensory (5th Digit)  33C  Wrist    3.3 <3.2 13.9 >5  Right Ulnar Anti Sensory (5th Digit)  33C  Wrist    3.1 <3.2 10.0 >5   Motor Summary Table   Site NR Onset (ms) Norm Onset (ms) O-P Amp (mV) Norm O-P Amp Site1 Site2 Delta-0 (ms) Dist (cm) Vel (m/s) Norm Vel (m/s)  Left Median Motor (Abd Poll Brev)  33C  Wrist    10.2 <4.0 4.7 >5 Elbow Wrist 6.9 35.0 51 >50  Elbow    17.1  4.5         Right Median Motor (Abd Poll Brev)  33C  Wrist    7.0 <4.0 4.5 >5 Elbow Wrist 6.1 34.0 56 >50  Elbow    13.1  4.2         Left Ulnar Motor (Abd Dig Minimi)  33C  Wrist    2.4 <3.1 8.9 >7 B Elbow Wrist 4.0 27.0 68 >50  B Elbow    6.4  8.6  A Elbow B Elbow 2.3 10.0 43 >50  A Elbow    8.7  8.6         Right Ulnar Motor (Abd Dig Minimi)  33C  Wrist    2.3 <3.1 8.8 >7 B Elbow Wrist 5.0 27.0 54 >50  B Elbow    7.3  8.6  A Elbow B Elbow 2.2 10.0 45 >50  A Elbow    9.5  8.8          EMG   Side Muscle Ins Act Fibs Psw Fasc  Number Recrt Dur Dur. Amp Amp. Poly Poly. Comment  Right 1stDorInt Nml Nml Nml Nml 1- Rapid Some 1+ Some 1+ Few 1+ N/A  Right PronatorTeres Nml Nml Nml Nml Nml Nml Nml Nml Nml Nml Nml Nml N/A  Right Biceps Nml Nml Nml Nml Nml Nml Nml Nml Nml Nml Nml Nml N/A  Right Triceps Nml Nml Nml Nml Nml Nml Nml Nml Nml Nml Nml Nml N/A  Right Deltoid Nml Nml Nml Nml 1- Rapid Some 1+ Some 1+ Nml Nml N/A  Right Abd Poll Brev Nml Nml Nml Nml SMU Rapid Most 1+ Most 1+ Many 1+ ATR  Right FlexCarpiUln Nml Nml Nml Nml 1- Rapid Some 1+ Some 1+ Few 1+ N/A  Right Infraspinatus Nml Nml Nml Nml 1- Rapid Some 1+ Some 1+ Nml Nml N/A  Left 1stDorInt Nml Nml Nml Nml 1- Rapid Some 1+ Some 1+ Nml Nml N/A  Left Abd Poll Brev Nml Nml Nml Nml SMU Rapid Most 1+ Most 1+ Some 1+ N/A  Left PronatorTeres Nml Nml Nml Nml Nml Nml Nml Nml Nml Nml Nml Nml N/A  Left Biceps Nml Nml Nml Nml Nml Nml Nml Nml Nml Nml Nml Nml N/A  Left Triceps Nml Nml Nml Nml  Nml Nml Nml Nml Nml Nml Nml Nml N/A  Left Deltoid Nml Nml Nml Nml Nml Nml Nml Nml Nml Nml Nml Nml N/A  Left FlexCarpiUln Nml Nml Nml Nml 1- Rapid Some 1+ Some 1+ Nml Nml N/A      Waveforms:

## 2018-09-02 ENCOUNTER — Telehealth: Payer: Self-pay | Admitting: Neurology

## 2018-09-02 ENCOUNTER — Other Ambulatory Visit: Payer: Self-pay | Admitting: Family Medicine

## 2018-09-02 DIAGNOSIS — G5603 Carpal tunnel syndrome, bilateral upper limbs: Secondary | ICD-10-CM

## 2018-09-02 NOTE — Telephone Encounter (Signed)
Patient made aware and is okay with referral.

## 2018-09-02 NOTE — Telephone Encounter (Signed)
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  It appears severe.  Therefore I recommend that we refer him to a hand specialist/surgeon for possible surgical procedure.

## 2018-09-02 NOTE — Telephone Encounter (Signed)
Referral faxed to Dr. Amedeo Plenty with [facility] at fax# [phone] with confirmation received. They will call patient to schedule.

## 2018-09-06 ENCOUNTER — Telehealth: Payer: Self-pay

## 2018-09-06 ENCOUNTER — Ambulatory Visit (INDEPENDENT_AMBULATORY_CARE_PROVIDER_SITE_OTHER): Payer: Medicare Other

## 2018-09-06 DIAGNOSIS — E538 Deficiency of other specified B group vitamins: Secondary | ICD-10-CM | POA: Diagnosis not present

## 2018-09-06 MED ORDER — CYANOCOBALAMIN 1000 MCG/ML IJ SOLN
1000.0000 ug | Freq: Once | INTRAMUSCULAR | Status: AC
  Administered 2018-09-06: 1000 ug via INTRAMUSCULAR

## 2018-09-06 NOTE — Progress Notes (Signed)
Per orders of Dr. Volanda Napoleon, injection of B12 given by Rebecca Eaton. Patient tolerated injection well.

## 2018-09-06 NOTE — Telephone Encounter (Signed)
Rcvd fax from [facility], Pt has an appt with Dr Caralyn Guile 10/06/18 @ 10am. Called and spoke with wife Stanton Kidney and advised her.

## 2018-09-12 ENCOUNTER — Other Ambulatory Visit: Payer: Self-pay | Admitting: Family Medicine

## 2018-09-13 NOTE — Telephone Encounter (Signed)
Pt last OV was on 06/15/2018 and last refill was done on 07/14/2018 for 120 with one refill, please Advise if ok to call the Rx to pt pharmacy

## 2018-10-06 ENCOUNTER — Ambulatory Visit (INDEPENDENT_AMBULATORY_CARE_PROVIDER_SITE_OTHER): Payer: Medicare Other

## 2018-10-06 DIAGNOSIS — E538 Deficiency of other specified B group vitamins: Secondary | ICD-10-CM | POA: Diagnosis not present

## 2018-10-06 DIAGNOSIS — G5602 Carpal tunnel syndrome, left upper limb: Secondary | ICD-10-CM | POA: Diagnosis not present

## 2018-10-06 DIAGNOSIS — G5601 Carpal tunnel syndrome, right upper limb: Secondary | ICD-10-CM | POA: Diagnosis not present

## 2018-10-06 MED ORDER — CYANOCOBALAMIN 1000 MCG/ML IJ SOLN
1000.0000 ug | Freq: Once | INTRAMUSCULAR | Status: AC
  Administered 2018-10-06: 1000 ug via INTRAMUSCULAR

## 2018-10-06 NOTE — Progress Notes (Signed)
Per orders of Dr. Volanda Napoleon injection of B12 given by Franco Collet. Patient tolerated injection well.

## 2018-10-07 ENCOUNTER — Ambulatory Visit: Payer: Medicare Other

## 2018-10-31 ENCOUNTER — Other Ambulatory Visit: Payer: Self-pay | Admitting: Family Medicine

## 2018-11-01 NOTE — Telephone Encounter (Signed)
Rx was last refilled on 10/31/2017 and pt LOV was 07/2018 ok to send refill

## 2018-11-03 ENCOUNTER — Ambulatory Visit (INDEPENDENT_AMBULATORY_CARE_PROVIDER_SITE_OTHER): Payer: Medicare Other | Admitting: *Deleted

## 2018-11-03 DIAGNOSIS — E538 Deficiency of other specified B group vitamins: Secondary | ICD-10-CM | POA: Diagnosis not present

## 2018-11-03 MED ORDER — CYANOCOBALAMIN 1000 MCG/ML IJ SOLN
1000.0000 ug | Freq: Once | INTRAMUSCULAR | Status: AC
  Administered 2018-11-03: 1000 ug via INTRAMUSCULAR

## 2018-11-03 NOTE — Progress Notes (Signed)
Per orders of Dr. Fry, injection of B12 given by Jenica Costilow. Patient tolerated injection well. 

## 2018-11-04 NOTE — Telephone Encounter (Signed)
Patient came in today to request a refill on his  celecoxib (CELEBREX) 100 MG capsule   Send to:   [id number] [place], [place] [phone] (Phone) [phone] (Fax)

## 2018-11-04 NOTE — Telephone Encounter (Signed)
Patient called to get the status of his medication for celecoxib (CELEBREX) 100 MG capsule.  He stated that he really needs this medication and had requested it since last Thursday.  Please advise

## 2018-11-10 DIAGNOSIS — G5601 Carpal tunnel syndrome, right upper limb: Secondary | ICD-10-CM | POA: Diagnosis not present

## 2018-11-10 DIAGNOSIS — G5602 Carpal tunnel syndrome, left upper limb: Secondary | ICD-10-CM | POA: Diagnosis not present

## 2018-11-18 ENCOUNTER — Ambulatory Visit (INDEPENDENT_AMBULATORY_CARE_PROVIDER_SITE_OTHER): Payer: Medicare Other | Admitting: Family Medicine

## 2018-11-18 ENCOUNTER — Encounter: Payer: Self-pay | Admitting: Family Medicine

## 2018-11-18 VITALS — BP 110/62 | HR 54 | Temp 97.7°F | Wt 182.0 lb

## 2018-11-18 DIAGNOSIS — D229 Melanocytic nevi, unspecified: Secondary | ICD-10-CM

## 2018-11-18 DIAGNOSIS — L918 Other hypertrophic disorders of the skin: Secondary | ICD-10-CM

## 2018-11-18 DIAGNOSIS — L919 Hypertrophic disorder of the skin, unspecified: Secondary | ICD-10-CM | POA: Diagnosis not present

## 2018-11-18 NOTE — Progress Notes (Signed)
Subjective:    Patient ID: Scott [name], male    DOB: 06-19-1946, 73 y.o.   MRN: [id number]  No chief complaint on file.   HPI Patient was seen today for ongoing concern.  Pt with skin tag, present x yrs, on R shoulder has become painful.  Pt having difficulty sleeping as lays on the spot.    Past Medical History:  Diagnosis Date  . ANEMIA, OTHER UNSPEC 08/02/2009  . B12 DEFICIENCY 07/07/2007  . Bell's palsy     around 2005  . BPPV (benign paroxysmal positional vertigo)    2003  . GERD 07/13/2007  . Gout, unspecified 06/17/2010  . HYPERLIPIDEMIA 07/13/2007  . HYPERTENSION 07/13/2007  . RENAL INSUFFICIENCY 06/04/2010    Allergies  Allergen Reactions  . Nsaids     REACTION: hx of renal insufficiency  . Statins     myalgias    ROS General: Denies fever, chills, night sweats, changes in weight, changes in appetite HEENT: Denies headaches, ear pain, changes in vision, rhinorrhea, sore throat CV: Denies CP, palpitations, SOB, orthopnea Pulm: Denies SOB, cough, wheezing GI: Denies abdominal pain, nausea, vomiting, diarrhea, constipation GU: Denies dysuria, hematuria, frequency, vaginal discharge Msk: Denies muscle cramps, joint pains Neuro: Denies weakness, numbness, tingling Skin: Denies rashes, bruising  +skin tag Psych: Denies depression, anxiety, hallucinations    Objective:    Blood pressure 110/62, pulse (!) 54, temperature 97.7 F (36.5 C), temperature source Oral, weight 182 lb (82.6 kg), SpO2 98 %.  Gen. Pleasant, well-nourished, in no distress, normal affect   HEENT: [place]/AT, face symmetric, conjunctiva clear, no scleral icterus, PERRLA, nares patent without drainage, pharynx without erythema or exudate. Lungs: no accessory muscle use, CTAB, no wheezes or rales Cardiovascular: RRR, no m/r/g, no peripheral edema Neuro:  A&Ox3, CN II-XII intact, normal gait Skin:  Warm, no lesions/ rash.  R posterior shoulder/upper back with a 8 mm round skin tag with erythema attached  by a stalk.  A 5 mm raised nevus adjacent to the skin tag noted.    Wt Readings from Last 3 Encounters:  11/18/18 182 lb (82.6 kg)  08/03/18 177 lb (80.3 kg)  06/15/18 176 lb (79.8 kg)    Lab Results  Component Value Date   WBC 4.9 06/15/2018   HGB 12.0 (L) 06/15/2018   HCT 36.1 (L) 06/15/2018   PLT 208.0 06/15/2018   GLUCOSE 82 06/15/2018   CHOL 203 (H) 03/30/2017   TRIG 113.0 03/30/2017   HDL 43.30 03/30/2017   LDLDIRECT 131.0 01/24/2016   LDLCALC 137 (H) 03/30/2017   ALT 19 03/30/2017   AST 24 03/30/2017   NA 130 (L) 06/15/2018   K 4.1 06/15/2018   CL 94 (L) 06/15/2018   CREATININE 1.46 06/15/2018   BUN 11 06/15/2018   CO2 31 06/15/2018   TSH 1.93 10/10/2014   PSA 3.52 03/30/2017   HGBA1C 6.2 06/15/2018    Assessment/Plan:  Skin tag  -Consent obtained.  skin tag removed without issue using scapel.  Minimal blood loss. -given erythematous appearance will send to path. -Plan: Dermatology pathology   Multiple benign nevi  -consent obtained.  cryotherapy used.    Cryotherapy Procedure Note: Location:  R shoulder  Consent obtained.  R/b/a reviewed.  Liquid nitrogen was applied using the liquid nitrogen gun without difficulty. Tolerated well without complications.  After care reviewed.  F/u prn  Grier Mitts, MD

## 2018-11-18 NOTE — Patient Instructions (Signed)
Skin Tag, Adult  A skin tag (acrochordon) is a soft, extra growth of skin. Most skin tags are flesh-colored and rarely bigger than a pencil eraser. They commonly form near areas where there are folds in the skin, such as the armpit or groin. Skin tags are not dangerous, and they do not spread from person to person (are not contagious). You may have one skin tag or several. Skin tags do not require treatment. However, your health care provider may recommend removal of a skin tag if it:  Gets irritated from clothing.  Bleeds.  Is visible and unsightly. Your health care provider can remove skin tags with a simple surgical procedure or a procedure that involves freezing the skin tag. Follow these instructions at home:  Watch for any changes in your skin tag. A normal skin tag does not require any other special care at home.  Take over-the-counter and prescription medicines only as told by your health care provider.  Keep all follow-up visits as told by your health care provider. This is important. Contact a health care provider if:  You have a skin tag that: ? Becomes painful. ? Changes color. ? Bleeds. ? Swells.  You develop more skin tags. This information is not intended to replace advice given to you by your health care provider. Make sure you discuss any questions you have with your health care provider. Document Released: 11/03/2015 Document Revised: 06/14/2016 Document Reviewed: 11/03/2015 Elsevier Interactive Patient Education  2019 [employer] Inc[employer]  Cryoablation, Care After This sheet gives you information about how to care for yourself after your procedure. Your health care provider may also give you more specific instructions. If you have problems or questions, contact your health care provider. What can I expect after the procedure? After the procedure, it is common to have:  Soreness around the treatment area.  Mild pain and swelling in the treatment area. Follow these  instructions at home: Treatment area care   Follow instructions from your health care provider about how to take care of your incision. Make sure you: ? Wash your hands with soap and water before you change your bandage (dressing). If soap and water are not available, use hand sanitizer. ? Change your dressing as told by your health care provider. ? Leave stitches (sutures) in place. They may need to stay in place for 2 weeks or longer.  Check your treatment area every day for signs of infection. Check for: ? More redness, swelling, or pain. ? More fluid or blood. ? Warmth. ? Pus or a bad smell.  Keep the treated area clean, dry, and covered with a dressing until it has healed. Clean the area with soap and water or as told by your health care provider.  You may shower if your health care provider approves. If your bandage gets wet, change it right away. Activity  Follow instructions from your health care provider about any activity limitations.  Do not drive for 24 hours if you received a medicine to help you relax (sedative). General instructions  Take over-the-counter and prescription medicines only as told by your health care provider.  Keep all follow-up visits as told by your health care provider. This is important. Contact a health care provider if:  You do not have a bowel movement for 2 days.  You have nausea or vomiting.  You have more redness, swelling, or pain around your treatment area.  You have more fluid or blood coming from your treatment area.  Your treatment  area feels warm to the touch.  You have pus or a bad smell coming from your treatment area.  You have a fever. Get help right away if:  You have severe pain.  You have trouble swallowing or breathing.  You have severe weakness or dizziness.  You have chest pain or shortness of breath. This information is not intended to replace advice given to you by your health care provider. Make sure you  discuss any questions you have with your health care provider. Document Released: 08/09/2013 Document Revised: 05/08/2016 Document Reviewed: 03/18/2016 Elsevier Interactive Patient Education  2019 [employer].

## 2018-11-20 ENCOUNTER — Other Ambulatory Visit: Payer: Self-pay | Admitting: Family Medicine

## 2018-11-24 NOTE — Telephone Encounter (Signed)
Rx is not on pt medication list, please advise if pt needs to continue taking and if yes he is requesting refill

## 2018-11-25 NOTE — Telephone Encounter (Signed)
Please advise 

## 2018-11-25 NOTE — Telephone Encounter (Signed)
Pt calling to check status of refill request. Please call before end of day today as it has now been 5 days.

## 2018-12-02 DIAGNOSIS — G5601 Carpal tunnel syndrome, right upper limb: Secondary | ICD-10-CM | POA: Diagnosis not present

## 2018-12-07 ENCOUNTER — Ambulatory Visit (INDEPENDENT_AMBULATORY_CARE_PROVIDER_SITE_OTHER): Payer: Medicare Other | Admitting: *Deleted

## 2018-12-07 DIAGNOSIS — E538 Deficiency of other specified B group vitamins: Secondary | ICD-10-CM | POA: Diagnosis not present

## 2018-12-07 MED ORDER — CYANOCOBALAMIN 1000 MCG/ML IJ SOLN
1000.0000 ug | Freq: Once | INTRAMUSCULAR | Status: AC
  Administered 2018-12-07: 1000 ug via INTRAMUSCULAR

## 2018-12-07 NOTE — Progress Notes (Signed)
Per orders of Dr. Banks, injection of Cyanocobalamin 1000mcg given by Funderburk, Jo A. °Patient tolerated injection well. ° °

## 2019-01-05 ENCOUNTER — Ambulatory Visit (INDEPENDENT_AMBULATORY_CARE_PROVIDER_SITE_OTHER): Payer: Medicare Other | Admitting: *Deleted

## 2019-01-05 DIAGNOSIS — E538 Deficiency of other specified B group vitamins: Secondary | ICD-10-CM | POA: Diagnosis not present

## 2019-01-05 MED ORDER — CYANOCOBALAMIN 1000 MCG/ML IJ SOLN
1000.0000 ug | Freq: Once | INTRAMUSCULAR | Status: AC
  Administered 2019-01-05: 1000 ug via INTRAMUSCULAR

## 2019-01-05 NOTE — Progress Notes (Signed)
Patient here for B-12 injection. Injection tolerated well.

## 2019-01-31 ENCOUNTER — Telehealth: Payer: Self-pay | Admitting: *Deleted

## 2019-01-31 NOTE — Telephone Encounter (Signed)
Pt can take sublingual B12 or wait if he would like.

## 2019-01-31 NOTE — Telephone Encounter (Signed)
Called patient to cancel his appointment for 02/03/2019. Dr. Volanda Napoleon do you want to prescrube an alternate form of B12 or defer for one month?

## 2019-01-31 NOTE — Telephone Encounter (Signed)
Patient returning call. Relayed the message below. Would like to know how often he is to take the sublingual B12? And what dosage?

## 2019-02-01 ENCOUNTER — Other Ambulatory Visit: Payer: Self-pay | Admitting: Family Medicine

## 2019-02-01 DIAGNOSIS — E538 Deficiency of other specified B group vitamins: Secondary | ICD-10-CM

## 2019-02-01 MED ORDER — CYANOCOBALAMIN 1000 MCG SL SUBL: 1000.0000 ug | SUBLINGUAL_TABLET | SUBLINGUAL | 4 refills | Status: AC

## 2019-02-01 NOTE — Telephone Encounter (Signed)
Please Advise

## 2019-02-01 NOTE — Telephone Encounter (Signed)
Med sent to pts pharmacy.

## 2019-02-03 ENCOUNTER — Ambulatory Visit: Payer: Medicare Other

## 2019-02-07 ENCOUNTER — Ambulatory Visit: Payer: Medicare Other | Admitting: Neurology

## 2019-02-13 ENCOUNTER — Other Ambulatory Visit: Payer: Self-pay | Admitting: Family Medicine

## 2019-02-13 NOTE — Telephone Encounter (Signed)
There is no order for the Rx on pt chart. Rx was last filled on 07/14/2018 please advise

## 2019-02-28 ENCOUNTER — Other Ambulatory Visit: Payer: Self-pay | Admitting: Family Medicine

## 2019-03-06 ENCOUNTER — Telehealth: Payer: Self-pay | Admitting: Family Medicine

## 2019-03-06 ENCOUNTER — Other Ambulatory Visit: Payer: Self-pay

## 2019-03-06 MED ORDER — COLCHICINE 0.6 MG PO TABS: 0.6000 mg | ORAL_TABLET | Freq: Every day | ORAL | 1 refills | Status: DC

## 2019-03-06 NOTE — Telephone Encounter (Signed)
Copied from [place] [id number]. Topic: Quick Communication - Rx Refill/Question >> Mar 06, 2019 10:31 AM Scott [name] wrote: Medication: colchicine 0.6 MG tablet  Has the patient contacted their pharmacy? Yes - needs new script (Agent: If no, request that the patient contact the pharmacy for the refill.) (Agent: If yes, when and what did the pharmacy advise?)  Preferred Pharmacy (with phone number or street name): [id number] [place], [place] [phone] (Phone) [phone] (Fax)  Agent: Please be advised that RX refills may take up to 3 business days. We ask that you follow-up with your pharmacy.

## 2019-03-06 NOTE — Telephone Encounter (Signed)
Rx sent to pt pharmacy 

## 2019-03-30 ENCOUNTER — Other Ambulatory Visit: Payer: Self-pay | Admitting: Family Medicine

## 2019-04-06 ENCOUNTER — Other Ambulatory Visit: Payer: Self-pay | Admitting: Family Medicine

## 2019-04-21 ENCOUNTER — Other Ambulatory Visit: Payer: Self-pay | Admitting: Family Medicine

## 2019-04-27 ENCOUNTER — Other Ambulatory Visit: Payer: Self-pay | Admitting: Family Medicine

## 2019-04-27 ENCOUNTER — Telehealth: Payer: Self-pay | Admitting: Family Medicine

## 2019-04-27 NOTE — Telephone Encounter (Signed)
Pt LOV was 11/18/2018 and last refill was done on 02/13/2019 please advise

## 2019-04-27 NOTE — Telephone Encounter (Signed)
traMADol (ULTRAM) 50 MG tablet Medication Date: 02/13/2019 Department: [facility] Ordering/Authorizing: [name], MD   [id number] [address], [place] [phone] (Phone) [phone] (Fax)   Pls refill

## 2019-04-27 NOTE — Telephone Encounter (Signed)
Med refilled.

## 2019-05-15 DIAGNOSIS — G5601 Carpal tunnel syndrome, right upper limb: Secondary | ICD-10-CM | POA: Diagnosis not present

## 2019-05-15 DIAGNOSIS — G5603 Carpal tunnel syndrome, bilateral upper limbs: Secondary | ICD-10-CM | POA: Diagnosis not present

## 2019-05-24 DIAGNOSIS — G5602 Carpal tunnel syndrome, left upper limb: Secondary | ICD-10-CM | POA: Diagnosis not present

## 2019-05-26 ENCOUNTER — Ambulatory Visit: Payer: Medicare Other | Admitting: Neurology

## 2019-06-08 DIAGNOSIS — G5602 Carpal tunnel syndrome, left upper limb: Secondary | ICD-10-CM | POA: Diagnosis not present

## 2019-07-03 ENCOUNTER — Other Ambulatory Visit: Payer: Self-pay | Admitting: Family Medicine

## 2019-07-05 NOTE — Telephone Encounter (Signed)
Pt LOV was 11/18/2018 and last refill was 04/27/2019 for 120 tablets with 1 refill. Please advise

## 2019-07-07 ENCOUNTER — Other Ambulatory Visit: Payer: Self-pay | Admitting: Family Medicine

## 2019-07-13 NOTE — Progress Notes (Deleted)
NEUROLOGY FOLLOW UP OFFICE NOTE  CHRISTIE BADAMI [id number]  HISTORY OF PRESENT ILLNESS: Scott [name] is a 73 year old right-handed male with hypertension, hyperlipidemia, renal insufficiency and history of Bell's palsy who follows up for cervicogenic headache.    UPDATE: No recent headaches. Current NSAIDS:  Celebrex (back pain) Current analgesics:  Tylenol (ineffective), tramadol (back pain) Current triptans:  no Current ergotamine:  no Current anti-emetic:  no Current muscle relaxants:  Tizanidine 2 to 4 mg at bedtime 2 to 3 times a week. Current anti-anxiolytic:  no Current sleep aide:  no Current Antihypertensive medications:  Cozaar, Maxzide-25 Current Antidepressant medications:  no Current Anticonvulsant medications:  no Current anti-CGRP:  no Current Vitamins/Herbal/Supplements:  no Current Antihistamines/Decongestants:  no Other therapy:  no  Depression:  no; Anxiety:  no Other pain:  Back pain  Since 2018, he has had numbness in both hands but no pain or weakness.  If he wakes up at night, his right hand hurts.  No associated radicular pain or weakness of the arms or hands.  B12 from 06/15/18 was 781.  Hgb A1c 6.2.  NCV-EMG of upper extremities from 09/01/18 showed findings consistent with severe bilateral carpal tunnel syndrome, as well as mild bilateral ulnar neuropathy with slowing across the elbow.  He was referred to a hand specialist and ***  HISTORY: Onset:  In August 2018, he fell and hit the right occipital region on concrete. He has since had headaches. No prior history of headaches. Location:Right occipital/subocciptal region with right sided neck pain into the shoulder. Quality:aching Initial ntensity:Mild to moderate.Hedenies new headache, thunderclap headache or severe headache that wakes himfrom sleep. Aura:no Prodrome:no Postdrome:no Associated symptoms:  No associated symptoms.No nausea, vomiting, photophobia,  phonophobia, or visual disturbance. She denies associated unilateral numbness or weakness. 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  MRI of brain with and without contrast from 11/10/17 was personally reviewed and showed chronic small vessel ischemic changes and possible tiny late subacute-chronic right frontal lacunar infarct.  Current NSAIDS: Celebrex(back pain) Current analgesics:Tylenol (ineffective),Tramadol(back pain) Current triptans:no Current anti-emetic:no Current muscle relaxants:no Current anti-anxiolytic:no Current sleep aide:no Current Antihypertensive medications: Cozaar, Maxzide-25 Current Antidepressant medications:no Current Anticonvulsant medications:no Current Vitamins/Herbal/Supplements:no Current Antihistamines/Decongestants:no Other therapy:no  Past NSAIDS:Aleve Past analgesics:no Past abortive triptans:no Past muscle relaxants:no Past anti-emetic:no Other past therapies:No His PCP recommended PT, but he is concerned about the expense.  Depression:no; Anxiety:no Other pain:Back pain  PAST MEDICAL HISTORY: Past Medical History:  Diagnosis Date  . ANEMIA, OTHER UNSPEC 08/02/2009  . B12 DEFICIENCY 07/07/2007  . Bell's palsy     around 2005  . BPPV (benign paroxysmal positional vertigo)    2003  . GERD 07/13/2007  . Gout, unspecified 06/17/2010  . HYPERLIPIDEMIA 07/13/2007  . HYPERTENSION 07/13/2007  . RENAL INSUFFICIENCY 06/04/2010    MEDICATIONS: Current Outpatient Medications on File Prior to Visit  Medication Sig Dispense Refill  . allopurinol (ZYLOPRIM) 300 MG tablet Take 1 tablet (300 mg total) by mouth daily.  90 tablet 3  . atorvastatin (LIPITOR) 20 MG tablet Take 1 tablet (20 mg total) by mouth once a week. 13 tablet 3  . celecoxib (CELEBREX) 100 MG capsule TAKE 1 CAPSULE (100 MG TOTAL) BY MOUTH 2 (TWO) TIMES DAILY AS NEEDED FOR MODERATE PAIN (SHOULDER). 60 capsule 5  .  colchicine 0.6 MG tablet TAKE 1 TABLET BY MOUTH EVERY DAY 90 tablet 1  . Cyanocobalamin 1000 MCG SUBL Place 1 tablet (1,000 mcg total) under the tongue once a week. 4 each 4  . fluticasone (FLONASE) 50 MCG/ACT nasal spray Place 2 sprays into both nostrils daily. 16 g 3  . losartan (COZAAR) 50 MG tablet TAKE 1 TABLET BY MOUTH EVERY DAY 90 tablet 1  . Polyethylene Glycol 3350 POWD Take 17 g by mouth daily.      . predniSONE (DELTASONE) 10 MG tablet Take 5 tabs on day 1, 4 tabs on day 2, 3 tabs on day 3, 2 tabs on day 4, 1 tab on day 5. 15 tablet 0  . predniSONE (DELTASONE) 10 MG tablet Take 4 tabs every morning for 3 days, 3 tabs for 2 days, 2 tabs for 2 days, 1 tab for 1 day. 23 tablet 0  . RABEprazole (ACIPHEX) 20 MG tablet Take 20 mg by mouth daily.      Marland Kitchen tiZANidine (ZANAFLEX) 4 MG tablet TAKE 1/2 TO 1 TABLET AT BEDTIME 30 tablet 5  . traMADol (ULTRAM) 50 MG tablet TAKE 2 TABLETS BY MOUTH (TWO) TIMES DAILY. 120 tablet 1  . triamterene-hydrochlorothiazide (MAXZIDE-25) 37.5-25 MG tablet TAKE 1 TABLET BY MOUTH EVERY DAY 90 tablet 3   No current facility-administered medications on file prior to visit.     ALLERGIES: Allergies  Allergen Reactions  . Nsaids     REACTION: hx of renal insufficiency  . Statins     myalgias    FAMILY HISTORY: Family History  Problem Relation Age of Onset  . Arthritis Father     SOCIAL HISTORY: Social History   Socioeconomic History  . Marital status: Married    Spouse name: MaryAnn  . Number of children: 4  . Years of education: Not on file  . Highest education level: Some college, no degree  Occupational History    Employer: RETIRED  Social Needs  . Financial resource strain: Not on  file  . Food insecurity    Worry: Not on file    Inability: Not on file  . Transportation needs    Medical: Not on file    Non-medical: Not on file  Tobacco Use  . Smoking status: Never Smoker  . Smokeless tobacco: Never Used  Substance and Sexual Activity  . Alcohol use: No  . Drug use: No  . Sexual activity: Not on file  Lifestyle  . Physical activity    Days per week: Not on file    Minutes per session: Not on file  . Stress: Not on file  Relationships  . Social Herbalist on phone: Not on file    Gets together: Not on file    Attends religious service: Not on file    Active member of club or organization: Not on file    Attends meetings of clubs or organizations: Not on file    Relationship status: Not on file  . Intimate partner violence    Fear of current or ex partner: Not on file    Emotionally abused: Not on file    Physically abused: Not on file    Forced sexual activity: Not on file  Other Topics Concern  . Not on file  Social History Narrative   Married 1965. 4 children. 11 grandchildren. 1 greatgrandchild on way in 2015.       Retired from [employer],  sports      Patient is right-handed. He is married, lives with his wife. He states he stops every morning and gets a 52oz tea. He walks most days.    REVIEW OF SYSTEMS: Constitutional: No fevers, chills, or sweats, no generalized fatigue, change in appetite Eyes: No visual changes, double vision, eye pain Ear, nose and throat: No hearing loss, ear pain, nasal congestion, sore throat Cardiovascular: No chest pain, palpitations Respiratory:  No shortness of breath at rest or with exertion, wheezes GastrointestinaI: No nausea, vomiting, diarrhea, abdominal pain, fecal incontinence Genitourinary:  No dysuria, urinary retention or frequency Musculoskeletal:  No neck pain, back pain Integumentary: No rash, pruritus, skin lesions Neurological: as above Psychiatric: No  depression, insomnia, anxiety Endocrine: No palpitations, fatigue, diaphoresis, mood swings, change in appetite, change in weight, increased thirst Hematologic/Lymphatic:  No purpura, petechiae. Allergic/Immunologic: no itchy/runny eyes, nasal congestion, recent allergic reactions, rashes  PHYSICAL EXAM: *** General: No acute distress.  Patient appears ***-groomed.   Head:  Normocephalic/atraumatic Eyes:  Fundi examined but not visualized Neck: supple, no paraspinal tenderness, full range of motion Heart:  Regular rate and rhythm Lungs:  Clear to auscultation bilaterally Back: No paraspinal tenderness Neurological Exam: alert and oriented to person, place, and time. Attention span and concentration intact, recent and remote memory intact, fund of knowledge intact.  Speech fluent and not dysarthric, language intact.  CN II-XII intact. Bulk and tone normal, muscle strength 5/5 throughout.  Sensation to light touch, temperature and vibration intact.  Deep tendon reflexes 2+ throughout, toes downgoing.  Finger to nose and heel to shin testing intact.  Gait normal, Romberg negative.  IMPRESSION: 1.  Cervicogenic headache, stable 2.  Bilateral carpal tunnel syndrome, status post ***  PLAN: ***  Metta Clines, DO  CC: ***

## 2019-07-14 ENCOUNTER — Ambulatory Visit: Payer: Medicare Other | Admitting: Neurology

## 2019-07-22 ENCOUNTER — Other Ambulatory Visit: Payer: Self-pay

## 2019-07-22 MED ORDER — ALLOPURINOL 300 MG PO TABS: 300.0000 mg | ORAL_TABLET | Freq: Every day | ORAL | 3 refills | Status: DC

## 2019-08-25 ENCOUNTER — Other Ambulatory Visit: Payer: Self-pay | Admitting: Family Medicine

## 2019-08-28 NOTE — Telephone Encounter (Signed)
Pt needs ov for future refills

## 2019-08-29 ENCOUNTER — Telehealth: Payer: Self-pay

## 2019-08-29 NOTE — Telephone Encounter (Signed)
Copied from [place] [id number]. Topic: General - Other >> Aug 29, 2019 11:01 AM Rainey Pines A wrote: Patient would like b12 checked for upcoming appointment on 10/29

## 2019-08-31 ENCOUNTER — Encounter: Payer: Self-pay | Admitting: Family Medicine

## 2019-08-31 ENCOUNTER — Ambulatory Visit (INDEPENDENT_AMBULATORY_CARE_PROVIDER_SITE_OTHER): Payer: Medicare Other | Admitting: Family Medicine

## 2019-08-31 ENCOUNTER — Other Ambulatory Visit: Payer: Self-pay

## 2019-08-31 VITALS — BP 110/70 | HR 68 | Temp 98.4°F | Wt 182.0 lb

## 2019-08-31 DIAGNOSIS — E538 Deficiency of other specified B group vitamins: Secondary | ICD-10-CM

## 2019-08-31 NOTE — Progress Notes (Signed)
Subjective:    Patient ID: Scott [name], male    DOB: 1946/03/07, 73 y.o.   MRN: [id number]  No chief complaint on file. pt accompanied by his grandson Herbie [place].  HPI Patient was seen today for f/u.  Interested in checking b12 level.  Receiving monthly b12 injections.  States has also been taking po B12 daily.  Endorses occasional pain in leg and R wrist pain 2/2 carpal tunnel.    Past Medical History:  Diagnosis Date  . ANEMIA, OTHER UNSPEC 08/02/2009  . B12 DEFICIENCY 07/07/2007  . Bell's palsy     around 2005  . BPPV (benign paroxysmal positional vertigo)    2003  . GERD 07/13/2007  . Gout, unspecified 06/17/2010  . HYPERLIPIDEMIA 07/13/2007  . HYPERTENSION 07/13/2007  . RENAL INSUFFICIENCY 06/04/2010    Allergies  Allergen Reactions  . Nsaids     REACTION: hx of renal insufficiency  . Statins     myalgias    ROS General: Denies fever, chills, night sweats, changes in weight, changes in appetite HEENT: Denies headaches, ear pain, changes in vision, rhinorrhea, sore throat CV: Denies CP, palpitations, SOB, orthopnea Pulm: Denies SOB, cough, wheezing GI: Denies abdominal pain, nausea, vomiting, diarrhea, constipation GU: Denies dysuria, hematuria, frequency, vaginal discharge Msk: Denies muscle cramps, joint pains  +carpal tunnel, leg pain Neuro: Denies weakness, numbness, tingling Skin: Denies rashes, bruising Psych: Denies depression, anxiety, hallucinations    Objective:    Blood pressure 110/70, pulse 68, temperature 98.4 F (36.9 C), temperature source Oral, weight 182 lb (82.6 kg), SpO2 98 %.   Gen. Pleasant, well-nourished, in no distress, normal affect   HEENT: [place]/AT, face symmetric, no scleral icterus, PERRLA, nares patent without drainage. Lungs: no accessory muscle use, CTAB, no wheezes or rales Cardiovascular: RRR, no m/r/g, no peripheral edema Musculoskeletal: No deformities, no cyanosis or clubbing, normal tone Neuro:  A&Ox3, CN II-XII intact, normal  gait  Wt Readings from Last 3 Encounters:  08/31/19 182 lb (82.6 kg)  11/18/18 182 lb (82.6 kg)  08/03/18 177 lb (80.3 kg)    Lab Results  Component Value Date   WBC 4.9 06/15/2018   HGB 12.0 (L) 06/15/2018   HCT 36.1 (L) 06/15/2018   PLT 208.0 06/15/2018   GLUCOSE 82 06/15/2018   CHOL 203 (H) 03/30/2017   TRIG 113.0 03/30/2017   HDL 43.30 03/30/2017   LDLDIRECT 131.0 01/24/2016   LDLCALC 137 (H) 03/30/2017   ALT 19 03/30/2017   AST 24 03/30/2017   NA 130 (L) 06/15/2018   K 4.1 06/15/2018   CL 94 (L) 06/15/2018   CREATININE 1.46 06/15/2018   BUN 11 06/15/2018   CO2 31 06/15/2018   TSH 1.93 10/10/2014   PSA 3.52 03/30/2017   HGBA1C 6.2 06/15/2018    Assessment/Plan:  Vitamin B12 deficiency  - Plan: Vitamin B12  F/u prn  Grier Mitts, MD

## 2019-08-31 NOTE — Patient Instructions (Signed)
Vitamin B12 Deficiency Vitamin B12 deficiency means that your body does not have enough vitamin B12. The body needs this vitamin:  To make red blood cells.  To make genes (DNA).  To help the nerves work. If you do not have enough vitamin B12 in your body, you can have health problems. What are the causes?  Not eating enough foods that contain vitamin B12.  Not being able to absorb vitamin B12 from the food that you eat.  Certain digestive system diseases.  A condition in which the body does not make enough of a certain protein, which results in too few red blood cells (pernicious anemia).  Having a surgery in which part of the stomach or small intestine is removed.  Taking medicines that make it hard for the body to absorb vitamin B12. These medicines include: ? Heartburn medicines. ? Some antibiotic medicines. ? Other medicines that are used to treat certain conditions. What increases the risk?  Being older than age 50.  Eating a vegetarian or vegan diet, especially while you are pregnant.  Eating a poor diet while you are pregnant.  Taking certain medicines.  Having alcoholism. What are the signs or symptoms? In some cases, there are no symptoms. If the condition leads to too few blood cells or nerve damage, symptoms can occur, such as:  Feeling weak.  Feeling tired (fatigued).  Not being hungry.  Weight loss.  A loss of feeling (numbness) or tingling in your hands and feet.  Redness and burning of the tongue.  Being mixed up (confused) or having memory problems.  Sadness (depression).  Problems with your senses. This can include color blindness, ringing in the ears, or loss of taste.  Watery poop (diarrhea) or trouble pooping (constipation).  Trouble walking. If anemia is very bad, symptoms can include:  Being short of breath.  Being dizzy.  Having a very fast heartbeat. How is this treated?  Changing the way you eat and drink, such as: ?  Eating more foods that contain vitamin B12. ? Drinking little or no alcohol.  Getting vitamin B12 shots.  Taking vitamin B12 supplements. Your doctor will tell you the dose that is best for you. Follow these instructions at home: Eating and drinking   Eat lots of healthy foods that contain vitamin B12. These include: ? Meats and poultry, such as beef, pork, chicken, turkey, and organ meats, such as liver. ? Seafood, such as clams, rainbow trout, salmon, tuna, and haddock. ? Eggs. ? Cereal and dairy products that have vitamin B12 added to them. Check the label. The items listed above may not be a complete list of what you can eat and drink. Contact a dietitian for more options. General instructions  Get any shots as told by your doctor.  Take supplements only as told by your doctor.  Do not drink alcohol if your doctor tells you not to. In some cases, you may only be asked to limit alcohol use.  Keep all follow-up visits as told by your doctor. This is important. Contact a doctor if:  Your symptoms come back. Get help right away if:  You have trouble breathing.  You have a very fast heartbeat.  You have chest pain.  You get dizzy.  You pass out. Summary  Vitamin B12 deficiency means that your body is not getting enough vitamin B12.  In some cases, there are no symptoms of this condition.  Treatment may include making a change in the way you eat and drink,   getting vitamin B12 shots, or taking supplements.  Eat lots of healthy foods that contain vitamin B12. This information is not intended to replace advice given to you by your health care provider. Make sure you discuss any questions you have with your health care provider. Document Released: 10/08/2011 Document Revised: 06/28/2018 Document Reviewed: 06/28/2018 Elsevier Patient Education  2020 Elsevier Inc.  

## 2019-08-31 NOTE — Telephone Encounter (Signed)
Pt was seen by Dr Volanda Napoleon this morning, B12 levels checked

## 2019-09-01 LAB — VITAMIN B12: Vitamin B-12: >1500 pg/mL — ABNORMAL HIGH (ref 211–911)

## 2019-09-03 ENCOUNTER — Encounter: Payer: Self-pay | Admitting: Family Medicine

## 2019-09-12 ENCOUNTER — Other Ambulatory Visit: Payer: Self-pay | Admitting: Neurology

## 2019-09-12 ENCOUNTER — Other Ambulatory Visit: Payer: Self-pay | Admitting: Family Medicine

## 2019-09-12 NOTE — Telephone Encounter (Signed)
Requested Prescriptions   Pending Prescriptions Disp Refills  . tiZANidine (ZANAFLEX) 4 MG tablet [Pharmacy Med Name: TIZANIDINE HCL 4 MG TABLET] 30 tablet 5    Sig: TAKE 1/2 TO 1 TABLET AT BEDTIME   Rx last filled:08/22/18 #30 5 refills  Pt last seen: 08/03/2018  Follow up appt scheduled:NONE  DENIED PATIENT NEEDS APPT IN OFFICE

## 2019-09-12 NOTE — Telephone Encounter (Signed)
Pt LOV was 08/31/2019 and last refill was on 07/17/2019 for 120 with 1 refill, please advise

## 2019-11-20 ENCOUNTER — Other Ambulatory Visit: Payer: Self-pay | Admitting: Family Medicine

## 2019-11-21 NOTE — Telephone Encounter (Signed)
Pt LOV was 08/31/2019 and last refill was done on 09/12/2019 for 180 tablets with 1 refill, please advise

## 2019-11-26 ENCOUNTER — Other Ambulatory Visit: Payer: Self-pay | Admitting: Family Medicine

## 2019-11-27 NOTE — Telephone Encounter (Signed)
Pt LOV was on 08/31/2019 and last refill was 11/06/2018 for 60 with 5 refills.

## 2019-12-28 DIAGNOSIS — G5602 Carpal tunnel syndrome, left upper limb: Secondary | ICD-10-CM | POA: Diagnosis not present

## 2019-12-28 DIAGNOSIS — G5601 Carpal tunnel syndrome, right upper limb: Secondary | ICD-10-CM | POA: Diagnosis not present

## 2020-01-16 DIAGNOSIS — H25091 Other age-related incipient cataract, right eye: Secondary | ICD-10-CM | POA: Diagnosis not present

## 2020-01-16 DIAGNOSIS — H5203 Hypermetropia, bilateral: Secondary | ICD-10-CM | POA: Diagnosis not present

## 2020-01-24 ENCOUNTER — Other Ambulatory Visit: Payer: Self-pay | Admitting: Family Medicine

## 2020-01-24 DIAGNOSIS — J309 Allergic rhinitis, unspecified: Secondary | ICD-10-CM

## 2020-02-01 ENCOUNTER — Other Ambulatory Visit: Payer: Self-pay | Admitting: Family Medicine

## 2020-02-05 NOTE — Telephone Encounter (Signed)
Pt LOV was on 08/31/2019 and last refill was on 11/11/2019 for 120 tablets with 1 refill, please advise

## 2020-02-13 DIAGNOSIS — H2513 Age-related nuclear cataract, bilateral: Secondary | ICD-10-CM | POA: Diagnosis not present

## 2020-02-13 DIAGNOSIS — H18413 Arcus senilis, bilateral: Secondary | ICD-10-CM | POA: Diagnosis not present

## 2020-02-13 DIAGNOSIS — H25043 Posterior subcapsular polar age-related cataract, bilateral: Secondary | ICD-10-CM | POA: Diagnosis not present

## 2020-02-13 DIAGNOSIS — H25013 Cortical age-related cataract, bilateral: Secondary | ICD-10-CM | POA: Diagnosis not present

## 2020-02-13 DIAGNOSIS — H2512 Age-related nuclear cataract, left eye: Secondary | ICD-10-CM | POA: Diagnosis not present

## 2020-02-15 ENCOUNTER — Other Ambulatory Visit: Payer: Self-pay | Admitting: Neurology

## 2020-02-23 ENCOUNTER — Other Ambulatory Visit: Payer: Self-pay | Admitting: Family Medicine

## 2020-03-11 DIAGNOSIS — H2512 Age-related nuclear cataract, left eye: Secondary | ICD-10-CM | POA: Diagnosis not present

## 2020-03-12 DIAGNOSIS — H2511 Age-related nuclear cataract, right eye: Secondary | ICD-10-CM | POA: Diagnosis not present

## 2020-04-07 ENCOUNTER — Other Ambulatory Visit: Payer: Self-pay | Admitting: Family Medicine

## 2020-04-08 DIAGNOSIS — H2511 Age-related nuclear cataract, right eye: Secondary | ICD-10-CM | POA: Diagnosis not present

## 2020-04-08 NOTE — Telephone Encounter (Signed)
Pt LOV was 08/31/2019 and last refill was done on 02/05/2020 for 120 tabs with 1 refill pt taking 2 tabs twice daily, please advise

## 2020-04-09 DIAGNOSIS — H2511 Age-related nuclear cataract, right eye: Secondary | ICD-10-CM | POA: Diagnosis not present

## 2020-04-09 DIAGNOSIS — H524 Presbyopia: Secondary | ICD-10-CM | POA: Diagnosis not present

## 2020-04-09 DIAGNOSIS — G5603 Carpal tunnel syndrome, bilateral upper limbs: Secondary | ICD-10-CM | POA: Diagnosis not present

## 2020-04-11 NOTE — Telephone Encounter (Signed)
Pt called to check on this medication refill and ask that Izora Gala give him a call back in regards to it.    Pt can be reached at [phone]

## 2020-04-15 NOTE — Telephone Encounter (Signed)
Pt would like a call back today in regards to his refill.   Pt can be reached at [phone]

## 2020-04-15 NOTE — Telephone Encounter (Signed)
Okay for refill?  

## 2020-04-17 DIAGNOSIS — G5601 Carpal tunnel syndrome, right upper limb: Secondary | ICD-10-CM | POA: Diagnosis not present

## 2020-04-24 ENCOUNTER — Other Ambulatory Visit: Payer: Self-pay

## 2020-04-24 ENCOUNTER — Encounter: Payer: Self-pay | Admitting: Family Medicine

## 2020-04-24 ENCOUNTER — Ambulatory Visit (INDEPENDENT_AMBULATORY_CARE_PROVIDER_SITE_OTHER): Payer: Medicare Other | Admitting: Family Medicine

## 2020-04-24 VITALS — BP 110/62 | HR 68 | Temp 97.9°F | Wt 179.0 lb

## 2020-04-24 DIAGNOSIS — E538 Deficiency of other specified B group vitamins: Secondary | ICD-10-CM

## 2020-04-24 DIAGNOSIS — M109 Gout, unspecified: Secondary | ICD-10-CM | POA: Diagnosis not present

## 2020-04-24 DIAGNOSIS — Z9889 Other specified postprocedural states: Secondary | ICD-10-CM | POA: Diagnosis not present

## 2020-04-24 DIAGNOSIS — I10 Essential (primary) hypertension: Secondary | ICD-10-CM | POA: Diagnosis not present

## 2020-04-24 DIAGNOSIS — N529 Male erectile dysfunction, unspecified: Secondary | ICD-10-CM | POA: Insufficient documentation

## 2020-04-24 MED ORDER — SILDENAFIL CITRATE 50 MG PO TABS: ORAL_TABLET | ORAL | 0 refills | Status: AC

## 2020-04-24 NOTE — Progress Notes (Signed)
Subjective:    Patient ID: Scott [name], male    DOB: 17-Sep-1946, 74 y.o.   MRN: [id number]  No chief complaint on file.   HPI Patient was seen today for f/u.  Pt s/p R carpal tunnel release last wk by Dr. Caralyn Guile.  States doing well, but had gout flare in R thumb which resolved after taking colchicine.  Pt inquires about viagra.  Pt taking po vitamin B12 2000 IU twice a wk.  Was receiving B12 injections.  Taking triameterene-HCTZ 37.5-25 mg daily.  Pt endorses occasional muscle cramps at night resolved by eating mustard.  Endorses having the [place] 19 vaccine in Feb.  Past Medical History:  Diagnosis Date  . ANEMIA, OTHER UNSPEC 08/02/2009  . B12 DEFICIENCY 07/07/2007  . Bell's palsy     around 2005  . BPPV (benign paroxysmal positional vertigo)    2003  . GERD 07/13/2007  . Gout, unspecified 06/17/2010  . HYPERLIPIDEMIA 07/13/2007  . HYPERTENSION 07/13/2007  . RENAL INSUFFICIENCY 06/04/2010    Allergies  Allergen Reactions  . Nsaids     REACTION: hx of renal insufficiency  . Statins     myalgias    ROS General: Denies fever, chills, night sweats, changes in weight, changes in appetite HEENT: Denies headaches, ear pain, changes in vision, rhinorrhea, sore throat CV: Denies CP, palpitations, SOB, orthopnea Pulm: Denies SOB, cough, wheezing GI: Denies abdominal pain, nausea, vomiting, diarrhea, constipation GU: Denies dysuria, hematuria, frequency Msk: Denies muscle cramps, joint pains  +R thumb pain, muscle cramps Neuro: Denies weakness, numbness, tingling Skin: Denies rashes, bruising Psych: Denies depression, anxiety, hallucinations     Objective:    Blood pressure 110/62, pulse 68, temperature 97.9 F (36.6 C), temperature source Temporal, weight 179 lb (81.2 kg), SpO2 98 %.  Gen. Pleasant, well-nourished, in no distress, normal affect   HEENT: [place]/AT, face symmetric, no scleral icterus, PERRLA, EOMI, nares patent without drainage Lungs: no accessory muscle use,  CTAB, no wheezes or rales Cardiovascular: RRR, no m/r/g, no peripheral edema Musculoskeletal: No deformities, no cyanosis or clubbing, normal tone.  Short arm cast on R arm Neuro:  A&Ox3, CN II-XII intact, normal gait Skin:  Warm, no lesions/ rash.  Lipoma on R upper back ~ 5 cm x 4 cm   Wt Readings from Last 3 Encounters:  04/24/20 179 lb (81.2 kg)  08/31/19 182 lb (82.6 kg)  11/18/18 182 lb (82.6 kg)    Lab Results  Component Value Date   WBC 4.9 06/15/2018   HGB 12.0 (L) 06/15/2018   HCT 36.1 (L) 06/15/2018   PLT 208.0 06/15/2018   GLUCOSE 82 06/15/2018   CHOL 203 (H) 03/30/2017   TRIG 113.0 03/30/2017   HDL 43.30 03/30/2017   LDLDIRECT 131.0 01/24/2016   LDLCALC 137 (H) 03/30/2017   ALT 19 03/30/2017   AST 24 03/30/2017   NA 130 (L) 06/15/2018   K 4.1 06/15/2018   CL 94 (L) 06/15/2018   CREATININE 1.46 06/15/2018   BUN 11 06/15/2018   CO2 31 06/15/2018   TSH 1.93 10/10/2014   PSA 3.52 03/30/2017   HGBA1C 6.2 06/15/2018    Assessment/Plan:  Acute gout of right hand, unspecified cause  -resolved after colchicine -continue allopurinol -avoid foods known to cause symptoms - Plan: Uric Acid, CBC with Differential/Platelet  Vitamin B12 deficiency  -taking po Vitamin B12 2 x/wk -will obtain lab, if needed will increase dose or restart injections. - Plan: Vitamin B12  Essential hypertension  -controlled -continue  lifestyle modifications - Plan: Comprehensive metabolic panel  Erectile dysfunction, unspecified erectile dysfunction type  -given precautions -will start sildenafil 25 mg to see if tolerated. - Plan: Comprehensive metabolic panel, sildenafil (VIAGRA) 50 MG tablet  S/P carpal tunnel release -stable -encouraged to keep f/u with Ortho.  F/u prn for AWV  Grier Mitts, MD

## 2020-04-24 NOTE — Patient Instructions (Signed)
Vitamin B12 Deficiency Vitamin B12 deficiency occurs when the body does not have enough vitamin B12, which is an important vitamin. The body needs this vitamin:  To make red blood cells.  To make DNA. This is the genetic material inside cells.  To help the nerves work properly so they can carry messages from the brain to the body. Vitamin B12 deficiency can cause various health problems, such as a low red blood cell count (anemia) or nerve damage. What are the causes? This condition may be caused by:  Not eating enough foods that contain vitamin B12.  Not having enough stomach acid and digestive fluids to properly absorb vitamin B12 from the food that you eat.  Certain digestive system diseases that make it hard to absorb vitamin B12. These diseases include Crohn's disease, chronic pancreatitis, and cystic fibrosis.  A condition in which the body does not make enough of a protein (intrinsic factor), resulting in too few red blood cells (pernicious anemia).  Having a surgery in which part of the stomach or small intestine is removed.  Taking certain medicines that make it hard for the body to absorb vitamin B12. These medicines include: ? Heartburn medicines (antacids and proton pump inhibitors). ? Certain antibiotic medicines. ? Some medicines that are used to treat diabetes, tuberculosis, gout, or high cholesterol. What increases the risk? The following factors may make you more likely to develop a B12 deficiency:  Being older than age 28.  Eating a vegetarian or vegan diet, especially while you are pregnant.  Eating a poor diet while you are pregnant.  Taking certain medicines.  Having alcoholism. What are the signs or symptoms? In some cases, there are no symptoms of this condition. If the condition leads to anemia or nerve damage, various symptoms can occur, such as:  Weakness.  Fatigue.  Loss of appetite.  Weight loss.  Numbness or tingling in your hands and  feet.  Redness and burning of the tongue.  Confusion or memory problems.  Depression.  Sensory problems, such as color blindness, ringing in the ears, or loss of taste.  Diarrhea or constipation.  Trouble walking. If anemia is severe, symptoms can include:  Shortness of breath.  Dizziness.  Rapid heart rate (tachycardia). How is this diagnosed? This condition may be diagnosed with a blood test to measure the level of vitamin B12 in your blood. You may also have other tests, including:  A group of tests that measure certain characteristics of blood cells (complete blood count, CBC).  A blood test to measure intrinsic factor.  A procedure where a thin tube with a camera on the end is used to look into your stomach or intestines (endoscopy). Other tests may be needed to discover the cause of B12 deficiency. How is this treated? Treatment for this condition depends on the cause. This condition may be treated by:  Changing your eating and drinking habits, such as: ? Eating more foods that contain vitamin B12. ? Drinking less alcohol or no alcohol.  Getting vitamin B12 injections.  Taking vitamin B12 supplements. Your health care provider will tell you which dosage is best for you. Follow these instructions at home: Eating and drinking   Eat lots of healthy foods that contain vitamin B12, including: ? Meats and poultry. This includes beef, pork, chicken, [place], and organ meats, such as liver. ? Seafood. This includes clams, rainbow trout, salmon, tuna, and haddock. ? Eggs. ? Cereal and dairy products that are fortified. This means that vitamin B12  has been added to the food. Check the label on the package to see if the food is fortified. The items listed above may not be a complete list of recommended foods and beverages. Contact a dietitian for more information. General instructions  Get any injections that are prescribed by your health care provider.  Take  supplements only as told by your health care provider. Follow the directions carefully.  Do not drink alcohol if your health care provider tells you not to. In some cases, you may only be asked to limit alcohol use.  Keep all follow-up visits as told by your health care provider. This is important. Contact a health care provider if:  Your symptoms come back. Get help right away if you:  Develop shortness of breath.  Have a rapid heart rate.  Have chest pain.  Become dizzy or lose consciousness. Summary  Vitamin B12 deficiency occurs when the body does not have enough vitamin B12.  The main causes of vitamin B12 deficiency include dietary deficiency, digestive diseases, pernicious anemia, and having a surgery in which part of the stomach or small intestine is removed.  In some cases, there are no symptoms of this condition. If the condition leads to anemia or nerve damage, various symptoms can occur, such as weakness, shortness of breath, and numbness.  Treatment may include getting vitamin B12 injections or taking vitamin B12 supplements. Eat lots of healthy foods that contain vitamin B12. This information is not intended to replace advice given to you by your health care provider. Make sure you discuss any questions you have with your health care provider. Document Revised: 04/07/2019 Document Reviewed: 06/28/2018 Elsevier Patient Education  2020 [place] Your Hypertension Hypertension is commonly called high blood pressure. This is when the force of your blood pressing against the walls of your arteries is too strong. Arteries are blood vessels that carry blood from your heart throughout your body. Hypertension forces the heart to work harder to pump blood, and may cause the arteries to become narrow or stiff. Having untreated or uncontrolled hypertension can cause heart attack, stroke, kidney disease, and other problems. What are blood pressure readings? A blood  pressure reading consists of a higher number over a lower number. Ideally, your blood pressure should be below 120/80. The first ("top") number is called the systolic pressure. It is a measure of the pressure in your arteries as your heart beats. The second ("bottom") number is called the diastolic pressure. It is a measure of the pressure in your arteries as the heart relaxes. What does my blood pressure reading mean? Blood pressure is classified into four stages. Based on your blood pressure reading, your health care provider may use the following stages to determine what type of treatment you need, if any. Systolic pressure and diastolic pressure are measured in a unit called mm Hg. Normal  Systolic pressure: below 474.  Diastolic pressure: below 80. Elevated  Systolic pressure: 259-563.  Diastolic pressure: below 80. Hypertension stage 1  Systolic pressure: 875-643.  Diastolic pressure: 32-95. Hypertension stage 2  Systolic pressure: 188 or above.  Diastolic pressure: 90 or above. What health risks are associated with hypertension? Managing your hypertension is an important responsibility. Uncontrolled hypertension can lead to:  A heart attack.  A stroke.  A weakened blood vessel (aneurysm).  Heart failure.  Kidney damage.  Eye damage.  Metabolic syndrome.  Memory and concentration problems. What changes can I make to manage my hypertension? Hypertension can be  managed by making lifestyle changes and possibly by taking medicines. Your health care provider will help you make a plan to bring your blood pressure within a normal range. Eating and drinking   Eat a diet that is high in fiber and potassium, and low in salt (sodium), added sugar, and fat. An example eating plan is called the DASH (Dietary Approaches to Stop Hypertension) diet. To eat this way: ? Eat plenty of fresh fruits and vegetables. Try to fill half of your plate at each meal with fruits and  vegetables. ? Eat whole grains, such as whole wheat pasta, brown rice, or whole grain bread. Fill about one quarter of your plate with whole grains. ? Eat low-fat diary products. ? Avoid fatty cuts of meat, processed or cured meats, and poultry with skin. Fill about one quarter of your plate with lean proteins such as fish, chicken without skin, beans, eggs, and tofu. ? Avoid premade and processed foods. These tend to be higher in sodium, added sugar, and fat.  Reduce your daily sodium intake. Most people with hypertension should eat less than 1,500 mg of sodium a day.  Limit alcohol intake to no more than 1 drink a day for nonpregnant women and 2 drinks a day for men. One drink equals 12 oz of beer, 5 oz of wine, or 1 oz of hard liquor. Lifestyle  Work with your health care provider to maintain a healthy body weight, or to lose weight. Ask what an ideal weight is for you.  Get at least 30 minutes of exercise that causes your heart to beat faster (aerobic exercise) most days of the week. Activities may include walking, swimming, or biking.  Include exercise to strengthen your muscles (resistance exercise), such as weight lifting, as part of your weekly exercise routine. Try to do these types of exercises for 30 minutes at least 3 days a week.  Do not use any products that contain nicotine or tobacco, such as cigarettes and e-cigarettes. If you need help quitting, ask your health care provider.  Control any long-term (chronic) conditions you have, such as high cholesterol or diabetes. Monitoring  Monitor your blood pressure at home as told by your health care provider. Your personal target blood pressure may vary depending on your medical conditions, your age, and other factors.  Have your blood pressure checked regularly, as often as told by your health care provider. Working with your health care provider  Review all the medicines you take with your health care provider because there may  be side effects or interactions.  Talk with your health care provider about your diet, exercise habits, and other lifestyle factors that may be contributing to hypertension.  Visit your health care provider regularly. Your health care provider can help you create and adjust your plan for managing hypertension. Will I need medicine to control my blood pressure? Your health care provider may prescribe medicine if lifestyle changes are not enough to get your blood pressure under control, and if:  Your systolic blood pressure is 130 or higher.  Your diastolic blood pressure is 80 or higher. Take medicines only as told by your health care provider. Follow the directions carefully. Blood pressure medicines must be taken as prescribed. The medicine does not work as well when you skip doses. Skipping doses also puts you at risk for problems. Contact a health care provider if:  You think you are having a reaction to medicines you have taken.  You have repeated (recurrent) headaches.  You feel dizzy.  You have swelling in your ankles.  You have trouble with your vision. Get help right away if:  You develop a severe headache or confusion.  You have unusual weakness or numbness, or you feel faint.  You have severe pain in your chest or abdomen.  You vomit repeatedly.  You have trouble breathing. Summary  Hypertension is when the force of blood pumping through your arteries is too strong. If this condition is not controlled, it may put you at risk for serious complications.  Your personal target blood pressure may vary depending on your medical conditions, your age, and other factors. For most people, a normal blood pressure is less than 120/80.  Hypertension is managed by lifestyle changes, medicines, or both. Lifestyle changes include weight loss, eating a healthy, low-sodium diet, exercising more, and limiting alcohol. This information is not intended to replace advice given to you by  your health care provider. Make sure you discuss any questions you have with your health care provider. Document Revised: 02/10/2019 Document Reviewed: 09/16/2016 Elsevier Patient Education  [place] A low-purine eating plan involves making food choices to limit your intake of purine. Purine is a kind of uric acid. Too much uric acid in your blood can cause certain conditions, such as gout and kidney stones. Eating a low-purine diet can help control these conditions. What are tips for following this plan? Reading food labels   Avoid foods with saturated or Trans fat.  Check the ingredient list of grains-based foods, such as bread and cereal, to make sure that they contain whole grains.  Check the ingredient list of sauces or soups to make sure they do not contain meat or fish.  When choosing soft drinks, check the ingredient list to make sure they do not contain high-fructose corn syrup. Shopping  Buy plenty of fresh fruits and vegetables.  Avoid buying canned or fresh fish.  Buy dairy products labeled as low-fat or nonfat.  Avoid buying premade or processed foods. These foods are often high in fat, salt (sodium), and added sugar. Cooking  Use olive oil instead of butter when cooking. Oils like olive oil, canola oil, and sunflower oil contain healthy fats. Meal planning  Learn which foods do or do not affect you. If you find out that a food tends to cause your gout symptoms to flare up, avoid eating that food. You can enjoy foods that do not cause problems. If you have any questions about a food item, talk with your dietitian or health care provider.  Limit foods high in fat, especially saturated fat. Fat makes it harder for your body to get rid of uric acid.  Choose foods that are lower in fat and are lean sources of protein. General guidelines  Limit alcohol intake to no more than 1 drink a day for nonpregnant women and 2 drinks a day for  men. One drink equals 12 oz of beer, 5 oz of wine, or 1 oz of hard liquor. Alcohol can affect the way your body gets rid of uric acid.  Drink plenty of water to keep your urine clear or pale yellow. Fluids can help remove uric acid from your body.  If directed by your health care provider, take a vitamin C supplement.  Work with your health care provider and dietitian to develop a plan to achieve or maintain a healthy weight. Losing weight can help reduce uric acid in your blood. What foods are recommended?  The items listed may not be a complete list. Talk with your dietitian about what dietary choices are best for you. Foods low in purines Foods low in purines do not need to be limited. These include:  All fruits.  All low-purine vegetables, pickles, and olives.  Breads, pasta, rice, cornbread, and popcorn. Cake and other baked goods.  All dairy foods.  Eggs, nuts, and nut butters.  Spices and condiments, such as salt, herbs, and vinegar.  Plant oils, butter, and margarine.  Water, sugar-free soft drinks, tea, coffee, and cocoa.  Vegetable-based soups, broths, sauces, and gravies. Foods moderate in purines Foods moderate in purines should be limited to the amounts listed.   cup of asparagus, cauliflower, spinach, mushrooms, or green peas, each day.  2/3 cup uncooked oatmeal, each day.   cup dry wheat bran or wheat germ, each day.  2-3 ounces of meat or poultry, each day.  4-6 ounces of shellfish, such as crab, lobster, oysters, or shrimp, each day.  1 cup cooked beans, peas, or lentils, each day.  Soup, broths, or bouillon made from meat or fish. Limit these foods as much as possible. What foods are not recommended? The items listed may not be a complete list. Talk with your dietitian about what dietary choices are best for you. Limit your intake of foods high in purines, including:  Beer and other alcohol.  Meat-based gravy or sauce.  Canned or fresh fish,  such as: ? Anchovies, sardines, herring, and tuna. ? Mussels and scallops. ? Codfish, trout, and haddock.  [place] Salines.  Organ meats, such as: ? Liver or kidney. ? Tripe. ? Sweetbreads (thymus gland or pancreas).  [employer].  Yeast or yeast extract supplements.  Drinks sweetened with high-fructose corn syrup. Summary  Eating a low-purine diet can help control conditions caused by too much uric acid in the body, such as gout or kidney stones.  Choose low-purine foods, limit alcohol, and limit foods high in fat.  You will learn over time which foods do or do not affect you. If you find out that a food tends to cause your gout symptoms to flare up, avoid eating that food. This information is not intended to replace advice given to you by your health care provider. Make sure you discuss any questions you have with your health care provider. Document Revised: 10/01/2017 Document Reviewed: 12/02/2016 Elsevier Patient Education  [place].  Gout  Gout is a condition that causes painful swelling of the joints. Gout is a type of inflammation of the joints (arthritis). This condition is caused by having too much uric acid in the body. Uric acid is a chemical that forms when the body breaks down substances called purines. Purines are important for building body proteins. When the body has too much uric acid, sharp crystals can form and build up inside the joints. This causes pain and swelling. Gout attacks can happen quickly and may be very painful (acute gout). Over time, the attacks can affect more joints and become more frequent (chronic gout). Gout can also cause uric acid to build up under the skin and inside the kidneys. What are the causes? This condition is caused by too much uric acid in your blood. This can happen because:  Your kidneys do not remove enough uric acid from your blood. This is the most common cause.  Your body makes too much uric acid. This can happen  with some cancers and cancer treatments. It can also occur if  your body is breaking down too many red blood cells (hemolytic anemia).  You eat too many foods that are high in purines. These foods include organ meats and some seafood. Alcohol, especially beer, is also high in purines. A gout attack may be triggered by trauma or stress. What increases the risk? You are more likely to develop this condition if you:  Have a family history of gout.  Are male and middle-aged.  Are male and have gone through menopause.  Are obese.  Frequently drink alcohol, especially beer.  Are dehydrated.  Lose weight too quickly.  Have an organ transplant.  Have lead poisoning.  Take certain medicines, including aspirin, cyclosporine, diuretics, levodopa, and niacin.  Have kidney disease.  Have a skin condition called psoriasis. What are the signs or symptoms? An attack of acute gout happens quickly. It usually occurs in just one joint. The most common place is the big toe. Attacks often start at night. Other joints that may be affected include joints of the feet, ankle, knee, fingers, wrist, or elbow. Symptoms of this condition may include:  Severe pain.  Warmth.  Swelling.  Stiffness.  Tenderness. The affected joint may be very painful to touch.  Shiny, red, or purple skin.  Chills and fever. Chronic gout may cause symptoms more frequently. More joints may be involved. You may also have white or yellow lumps (tophi) on your hands or feet or in other areas near your joints. How is this diagnosed? This condition is diagnosed based on your symptoms, medical history, and physical exam. You may have tests, such as:  Blood tests to measure uric acid levels.  Removal of joint fluid with a thin needle (aspiration) to look for uric acid crystals.  X-rays to look for joint damage. How is this treated? Treatment for this condition has two phases: treating an acute attack and preventing  future attacks. Acute gout treatment may include medicines to reduce pain and swelling, including:  NSAIDs.  Steroids. These are strong anti-inflammatory medicines that can be taken by mouth (orally) or injected into a joint.  Colchicine. This medicine relieves pain and swelling when it is taken soon after an attack. It can be given by mouth or through an IV. Preventive treatment may include:  Daily use of smaller doses of NSAIDs or colchicine.  Use of a medicine that reduces uric acid levels in your blood.  Changes to your diet. You may need to see a dietitian about what to eat and drink to prevent gout. Follow these instructions at home: During a gout attack   If directed, put ice on the affected area: ? Put ice in a plastic bag. ? Place a towel between your skin and the bag. ? Leave the ice on for 20 minutes, 2-3 times a day.  Raise (elevate) the affected joint above the level of your heart as often as possible.  Rest the joint as much as possible. If the affected joint is in your leg, you may be given crutches to use.  Follow instructions from your health care provider about eating or drinking restrictions. Avoiding future gout attacks  Follow a low-purine diet as told by your dietitian or health care provider. Avoid foods and drinks that are high in purines, including liver, kidney, anchovies, asparagus, herring, mushrooms, mussels, and beer.  Maintain a healthy weight or lose weight if you are overweight. If you want to lose weight, talk with your health care provider. It is important that you do not lose  weight too quickly.  Start or maintain an exercise program as told by your health care provider. Eating and drinking  Drink enough fluids to keep your urine pale yellow.  If you drink alcohol: ? Limit how much you use to:  0-1 drink a day for women.  0-2 drinks a day for men. ? Be aware of how much alcohol is in your drink. In the [place] one drink equals one 12 oz  bottle of beer (355 mL) one 5 oz glass of wine (148 mL), or one 1 oz glass of hard liquor (44 mL). General instructions  Take over-the-counter and prescription medicines only as told by your health care provider.  Do not drive or use heavy machinery while taking prescription pain medicine.  Return to your normal activities as told by your health care provider. Ask your health care provider what activities are safe for you.  Keep all follow-up visits as told by your health care provider. This is important. Contact a health care provider if you have:  Another gout attack.  Continuing symptoms of a gout attack after 10 days of treatment.  Side effects from your medicines.  Chills or a fever.  Burning pain when you urinate.  Pain in your lower back or belly. Get help right away if you:  Have severe or uncontrolled pain.  Cannot urinate. Summary  Gout is painful swelling of the joints caused by inflammation.  The most common site of pain is the big toe, but it can affect other joints in the body.  Medicines and dietary changes can help to prevent and treat gout attacks. This information is not intended to replace advice given to you by your health care provider. Make sure you discuss any questions you have with your health care provider. Document Revised: 05/11/2018 Document Reviewed: 05/11/2018 Elsevier Patient Education  [place].

## 2020-04-25 LAB — COMPREHENSIVE METABOLIC PANEL
ALT: 14 U/L (ref 0–53)
AST: 24 U/L (ref 0–37)
Albumin: 4.1 g/dL (ref 3.5–5.2)
Alkaline Phosphatase: 97 U/L (ref 39–117)
BUN: 19 mg/dL (ref 6–23)
CO2: 28 mEq/L (ref 19–32)
Calcium: 8.9 mg/dL (ref 8.4–10.5)
Chloride: 94 mEq/L — ABNORMAL LOW (ref 96–112)
Creatinine, Ser: 1.61 mg/dL — ABNORMAL HIGH (ref 0.40–1.50)
GFR: 50.69 mL/min — ABNORMAL LOW (ref >60.00)
Glucose, Bld: 100 mg/dL — ABNORMAL HIGH (ref 70–99)
Potassium: 4.1 mEq/L (ref 3.5–5.1)
Sodium: 127 mEq/L — ABNORMAL LOW (ref 135–145)
Total Bilirubin: 0.4 mg/dL (ref 0.2–1.2)
Total Protein: 6.4 g/dL (ref 6.0–8.3)

## 2020-04-25 LAB — CBC WITH DIFFERENTIAL/PLATELET
Basophils Absolute: 0.1 10*3/uL (ref 0.0–0.1)
Basophils Relative: 1 % (ref 0.0–3.0)
Eosinophils Absolute: 0.1 10*3/uL (ref 0.0–0.7)
Eosinophils Relative: 2.1 % (ref 0.0–5.0)
HCT: 34.4 % — ABNORMAL LOW (ref 39.0–52.0)
Hemoglobin: 11.4 g/dL — ABNORMAL LOW (ref 13.0–17.0)
Lymphocytes Relative: 16.1 % (ref 12.0–46.0)
Lymphs Abs: 0.8 10*3/uL (ref 0.7–4.0)
MCHC: 33.1 g/dL (ref 30.0–36.0)
MCV: 86.8 fl (ref 78.0–100.0)
Monocytes Absolute: 0.5 10*3/uL (ref 0.1–1.0)
Monocytes Relative: 9.1 % (ref 3.0–12.0)
Neutro Abs: 3.7 10*3/uL (ref 1.4–7.7)
Neutrophils Relative %: 71.7 % (ref 43.0–77.0)
Platelets: 208 10*3/uL (ref 150.0–400.0)
RBC: 3.96 Mil/uL — ABNORMAL LOW (ref 4.22–5.81)
RDW: 15.7 % — ABNORMAL HIGH (ref 11.5–15.5)
WBC: 5.1 10*3/uL (ref 4.0–10.5)

## 2020-04-25 LAB — VITAMIN B12: Vitamin B-12: 827 pg/mL (ref 211–911)

## 2020-04-25 LAB — URIC ACID: Uric Acid, Serum: 4.1 mg/dL (ref 4.0–7.8)

## 2020-04-26 ENCOUNTER — Other Ambulatory Visit: Payer: Self-pay | Admitting: Family Medicine

## 2020-04-26 DIAGNOSIS — E871 Hypo-osmolality and hyponatremia: Secondary | ICD-10-CM

## 2020-04-26 DIAGNOSIS — D509 Iron deficiency anemia, unspecified: Secondary | ICD-10-CM

## 2020-04-26 MED ORDER — SODIUM CHLORIDE 1 G PO TABS: 1.0000 g | ORAL_TABLET | Freq: Every day | ORAL | 0 refills | Status: AC

## 2020-04-29 DIAGNOSIS — Z961 Presence of intraocular lens: Secondary | ICD-10-CM | POA: Diagnosis not present

## 2020-04-30 DIAGNOSIS — G5601 Carpal tunnel syndrome, right upper limb: Secondary | ICD-10-CM | POA: Diagnosis not present

## 2020-05-01 ENCOUNTER — Telehealth: Payer: Self-pay | Admitting: Family Medicine

## 2020-05-01 NOTE — Telephone Encounter (Signed)
Pt is calling in stating that he would like to have a call back about his prescription that you stated was at the pharmacy.

## 2020-05-02 NOTE — Telephone Encounter (Signed)
Spoke with pt advised of the Rx sent to his pharmacy and pt appointment scheduled for lab on 05/08/20

## 2020-05-07 ENCOUNTER — Other Ambulatory Visit: Payer: Medicare Other

## 2020-05-08 ENCOUNTER — Other Ambulatory Visit: Payer: Medicare Other

## 2020-05-15 ENCOUNTER — Other Ambulatory Visit: Payer: Self-pay

## 2020-05-15 ENCOUNTER — Other Ambulatory Visit (INDEPENDENT_AMBULATORY_CARE_PROVIDER_SITE_OTHER): Payer: Medicare Other

## 2020-05-15 DIAGNOSIS — D509 Iron deficiency anemia, unspecified: Secondary | ICD-10-CM

## 2020-05-15 NOTE — Addendum Note (Signed)
Addended by: Marrion Coy on: 05/15/2020 09:02 AM   Modules accepted: Orders

## 2020-05-16 LAB — FERRITIN: Ferritin: 378 ng/mL (ref 24–380)

## 2020-05-16 LAB — BASIC METABOLIC PANEL
BUN/Creatinine Ratio: 13 (calc) (ref 6–22)
BUN: 20 mg/dL (ref 7–25)
CO2: 29 mmol/L (ref 20–32)
Calcium: 9.2 mg/dL (ref 8.6–10.3)
Chloride: 97 mmol/L — ABNORMAL LOW (ref 98–110)
Creat: 1.58 mg/dL — ABNORMAL HIGH (ref 0.70–1.18)
Glucose, Bld: 96 mg/dL (ref 65–99)
Potassium: 4.4 mmol/L (ref 3.5–5.3)
Sodium: 133 mmol/L — ABNORMAL LOW (ref 135–146)

## 2020-05-16 LAB — CBC WITH DIFFERENTIAL/PLATELET
Absolute Monocytes: 414 cells/uL (ref 200–950)
Basophils Absolute: 22 cells/uL (ref 0–200)
Basophils Relative: 0.5 %
Eosinophils Absolute: 110 cells/uL (ref 15–500)
Eosinophils Relative: 2.5 %
HCT: 36.2 % — ABNORMAL LOW (ref 38.5–50.0)
Hemoglobin: 11.5 g/dL — ABNORMAL LOW (ref 13.2–17.1)
Lymphs Abs: 1012 cells/uL (ref 850–3900)
MCH: 27.8 pg (ref 27.0–33.0)
MCHC: 31.8 g/dL — ABNORMAL LOW (ref 32.0–36.0)
MCV: 87.4 fL (ref 80.0–100.0)
MPV: 9.7 fL (ref 7.5–12.5)
Monocytes Relative: 9.4 %
Neutro Abs: 2842 cells/uL (ref 1500–7800)
Neutrophils Relative %: 64.6 %
Platelets: 179 10*3/uL (ref 140–400)
RBC: 4.14 10*6/uL — ABNORMAL LOW (ref 4.20–5.80)
RDW: 14.8 % (ref 11.0–15.0)
Total Lymphocyte: 23 %
WBC: 4.4 10*3/uL (ref 3.8–10.8)

## 2020-05-16 LAB — IRON, TOTAL/TOTAL IRON BINDING CAP
%SAT: 40 % (calc) (ref 20–48)
Iron: 119 ug/dL (ref 50–180)
TIBC: 300 mcg/dL (calc) (ref 250–425)

## 2020-06-20 ENCOUNTER — Other Ambulatory Visit: Payer: Self-pay

## 2020-06-20 NOTE — Progress Notes (Signed)
Erroneous encounter

## 2020-06-21 ENCOUNTER — Other Ambulatory Visit: Payer: Self-pay | Admitting: Family Medicine

## 2020-06-21 NOTE — Telephone Encounter (Signed)
Pt LOV was on 04/24/2020 and last refill was done on 04/15/2020 for 120 tablets with 1 refill, please advise

## 2020-06-24 ENCOUNTER — Other Ambulatory Visit: Payer: Self-pay

## 2020-06-24 ENCOUNTER — Ambulatory Visit (INDEPENDENT_AMBULATORY_CARE_PROVIDER_SITE_OTHER): Payer: Medicare Other

## 2020-06-24 DIAGNOSIS — Z0000 Encounter for general adult medical examination without abnormal findings: Secondary | ICD-10-CM | POA: Diagnosis not present

## 2020-06-24 NOTE — Patient Instructions (Signed)
Mr. Scott [name] , Thank you for taking time to come for your Medicare Wellness Visit. I appreciate your ongoing commitment to your health goals. Please review the following plan we discussed and let me know if I can assist you in the future.   Screening recommendations/referrals: Colonoscopy: Up to date next due 01/28/2022 Recommended yearly ophthalmology/optometry visit for glaucoma screening and checkup Recommended yearly dental visit for hygiene and checkup  Vaccinations: Influenza vaccine: Patient refused Pneumococcal vaccine: Completed series Tdap vaccine: Currently due, please contact your insurance to discuss cost or await and injury to receive Shingles vaccine: Currently due, please contact your pharmacy to discuss cost of vaccine and to receive    Advanced directives: Advance directive discussed with you today. Even though you declined this today please call our office should you change your mind and we can give you the proper paperwork for you to fill out.   Conditions/risks identified: None   Next appointment: 07/11/2020 @ 9:30 am with Dr. Volanda Napoleon  Preventive Care 76 Years and Older, Male Preventive care refers to lifestyle choices and visits with your health care provider that can promote health and wellness. What does preventive care include?  A yearly physical exam. This is also called an annual well check.  Dental exams once or twice a year.  Routine eye exams. Ask your health care provider how often you should have your eyes checked.  Personal lifestyle choices, including:  Daily care of your teeth and gums.  Regular physical activity.  Eating a healthy diet.  Avoiding tobacco and drug use.  Limiting alcohol use.  Practicing safe sex.  Taking low doses of aspirin every day.  Taking vitamin and mineral supplements as recommended by your health care provider. What happens during an annual well check? The services and screenings done by your health care  provider during your annual well check will depend on your age, overall health, lifestyle risk factors, and family history of disease. Counseling  Your health care provider may ask you questions about your:  Alcohol use.  Tobacco use.  Drug use.  Emotional well-being.  Home and relationship well-being.  Sexual activity.  Eating habits.  History of falls.  Memory and ability to understand (cognition).  Work and work Statistician. Screening  You may have the following tests or measurements:  Height, weight, and BMI.  Blood pressure.  Lipid and cholesterol levels. These may be checked every 5 years, or more frequently if you are over 76 years old.  Skin check.  Lung cancer screening. You may have this screening every year starting at age 76 if you have a 30-pack-year history of smoking and currently smoke or have quit within the past 15 years.  Fecal occult blood test (FOBT) of the stool. You may have this test every year starting at age 76.  Flexible sigmoidoscopy or colonoscopy. You may have a sigmoidoscopy every 5 years or a colonoscopy every 10 years starting at age 76.  Prostate cancer screening. Recommendations will vary depending on your family history and other risks.  Hepatitis C blood test.  Hepatitis B blood test.  Sexually transmitted disease (STD) testing.  Diabetes screening. This is done by checking your blood sugar (glucose) after you have not eaten for a while (fasting). You may have this done every 1-3 years.  Abdominal aortic aneurysm (AAA) screening. You may need this if you are a current or former smoker.  Osteoporosis. You may be screened starting at age 76 if you are at high risk. Talk  with your health care provider about your test results, treatment options, and if necessary, the need for more tests. Vaccines  Your health care provider may recommend certain vaccines, such as:  Influenza vaccine. This is recommended every year.  Tetanus,  diphtheria, and acellular pertussis (Tdap, Td) vaccine. You may need a Td booster every 10 years.  Zoster vaccine. You may need this after age 76.  Pneumococcal 13-valent conjugate (PCV13) vaccine. One dose is recommended after age 76.  Pneumococcal polysaccharide (PPSV23) vaccine. One dose is recommended after age 76. Talk to your health care provider about which screenings and vaccines you need and how often you need them. This information is not intended to replace advice given to you by your health care provider. Make sure you discuss any questions you have with your health care provider. Document Released: 11/15/2015 Document Revised: 07/08/2016 Document Reviewed: 08/20/2015 Elsevier Interactive Patient Education  2017 [place] Prevention in the Home Falls can cause injuries. They can happen to people of all ages. There are many things you can do to make your home safe and to help prevent falls. What can I do on the outside of my home?  Regularly fix the edges of walkways and driveways and fix any cracks.  Remove anything that might make you trip as you walk through a door, such as a raised step or threshold.  Trim any bushes or trees on the path to your home.  Use bright outdoor lighting.  Clear any walking paths of anything that might make someone trip, such as rocks or tools.  Regularly check to see if handrails are loose or broken. Make sure that both sides of any steps have handrails.  Any raised decks and porches should have guardrails on the edges.  Have any leaves, snow, or ice cleared regularly.  Use sand or salt on walking paths during winter.  Clean up any spills in your garage right away. This includes oil or grease spills. What can I do in the bathroom?  Use night lights.  Install grab bars by the toilet and in the tub and shower. Do not use towel bars as grab bars.  Use non-skid mats or decals in the tub or shower.  If you need to sit down in  the shower, use a plastic, non-slip stool.  Keep the floor dry. Clean up any water that spills on the floor as soon as it happens.  Remove soap buildup in the tub or shower regularly.  Attach bath mats securely with double-sided non-slip rug tape.  Do not have throw rugs and other things on the floor that can make you trip. What can I do in the bedroom?  Use night lights.  Make sure that you have a light by your bed that is easy to reach.  Do not use any sheets or blankets that are too big for your bed. They should not hang down onto the floor.  Have a firm chair that has side arms. You can use this for support while you get dressed.  Do not have throw rugs and other things on the floor that can make you trip. What can I do in the kitchen?  Clean up any spills right away.  Avoid walking on wet floors.  Keep items that you use a lot in easy-to-reach places.  If you need to reach something above you, use a strong step stool that has a grab bar.  Keep electrical cords out of the way.  Do  not use floor polish or wax that makes floors slippery. If you must use wax, use non-skid floor wax.  Do not have throw rugs and other things on the floor that can make you trip. What can I do with my stairs?  Do not leave any items on the stairs.  Make sure that there are handrails on both sides of the stairs and use them. Fix handrails that are broken or loose. Make sure that handrails are as long as the stairways.  Check any carpeting to make sure that it is firmly attached to the stairs. Fix any carpet that is loose or worn.  Avoid having throw rugs at the top or bottom of the stairs. If you do have throw rugs, attach them to the floor with carpet tape.  Make sure that you have a light switch at the top of the stairs and the bottom of the stairs. If you do not have them, ask someone to add them for you. What else can I do to help prevent falls?  Wear shoes that:  Do not have high  heels.  Have rubber bottoms.  Are comfortable and fit you well.  Are closed at the toe. Do not wear sandals.  If you use a stepladder:  Make sure that it is fully opened. Do not climb a closed stepladder.  Make sure that both sides of the stepladder are locked into place.  Ask someone to hold it for you, if possible.  Clearly mark and make sure that you can see:  Any grab bars or handrails.  First and last steps.  Where the edge of each step is.  Use tools that help you move around (mobility aids) if they are needed. These include:  Canes.  Walkers.  Scooters.  Crutches.  Turn on the lights when you go into a dark area. Replace any light bulbs as soon as they burn out.  Set up your furniture so you have a clear path. Avoid moving your furniture around.  If any of your floors are uneven, fix them.  If there are any pets around you, be aware of where they are.  Review your medicines with your doctor. Some medicines can make you feel dizzy. This can increase your chance of falling. Ask your doctor what other things that you can do to help prevent falls. This information is not intended to replace advice given to you by your health care provider. Make sure you discuss any questions you have with your health care provider. Document Released: 08/15/2009 Document Revised: 03/26/2016 Document Reviewed: 11/23/2014 Elsevier Interactive Patient Education  2017 [employer].

## 2020-06-24 NOTE — Progress Notes (Signed)
Subjective:   Scott [name] is a 74 y.o. male who presents for Medicare Annual/Subsequent preventive examination.  I connected with Rod Holler today by telephone and verified that I am speaking with the correct person using two identifiers. Location patient: home Location provider: work Persons participating in the virtual visit: patient, provider.   I discussed the limitations, risks, security and privacy concerns of performing an evaluation and management service by telephone and the availability of in person appointments. I also discussed with the patient that there may be a patient responsible charge related to this service. The patient expressed understanding and verbally consented to this telephonic visit.    Interactive audio and video telecommunications were attempted between this provider and patient, however failed, due to patient having technical difficulties OR patient did not have access to video capability.  We continued and completed visit with audio only.      Review of Systems    N/A Cardiac Risk Factors include: advanced age (>66men, >54 women);dyslipidemia;hypertension     Objective:    Today's Vitals   There is no height or weight on file to calculate BMI.  Advanced Directives 06/24/2020  Does Patient Have a Medical Advance Directive? No  Would patient like information on creating a medical advance directive? No - Patient declined    Current Medications (verified) Outpatient Encounter Medications as of 06/24/2020  Medication Sig  . allopurinol (ZYLOPRIM) 300 MG tablet Take 1 tablet (300 mg total) by mouth daily.  . celecoxib (CELEBREX) 100 MG capsule TAKE 1 CAPSULE (100 MG TOTAL) BY MOUTH 2 (TWO) TIMES DAILY AS NEEDED FOR MODERATE PAIN (SHOULDER).  . Cyanocobalamin 1000 MCG SUBL Place 1 tablet (1,000 mcg total) under the tongue once a week.  . fluticasone (FLONASE) 50 MCG/ACT nasal spray SPRAY 2 SPRAYS INTO EACH NOSTRIL EVERY DAY  . losartan (COZAAR)  50 MG tablet TAKE 1 TABLET BY MOUTH EVERY DAY  . Polyethylene Glycol 3350 POWD Take 17 g by mouth daily.    . RABEprazole (ACIPHEX) 20 MG tablet Take 20 mg by mouth daily.    . sildenafil (VIAGRA) 50 MG tablet Take one half tab (25 mg) daily as needed 1 hour prior to sexual activity.  Marland Kitchen tiZANidine (ZANAFLEX) 4 MG tablet TAKE 1/2 TO 1 TABLET AT BEDTIME  . traMADol (ULTRAM) 50 MG tablet TAKE 2 TABLETS BY MOUTH (TWO) TIMES DAILY.  Marland Kitchen triamterene-hydrochlorothiazide (MAXZIDE-25) 37.5-25 MG tablet TAKE 1 TABLET BY MOUTH EVERY DAY  . atorvastatin (LIPITOR) 20 MG tablet Take 1 tablet (20 mg total) by mouth once a week. (Patient not taking: Reported on 06/24/2020)  . colchicine 0.6 MG tablet TAKE 1 TABLET BY MOUTH EVERY DAY (Patient not taking: Reported on 06/24/2020)  . [DISCONTINUED] predniSONE (DELTASONE) 10 MG tablet Take 5 tabs on day 1, 4 tabs on day 2, 3 tabs on day 3, 2 tabs on day 4, 1 tab on day 5.  . [DISCONTINUED] predniSONE (DELTASONE) 10 MG tablet Take 4 tabs every morning for 3 days, 3 tabs for 2 days, 2 tabs for 2 days, 1 tab for 1 day.   No facility-administered encounter medications on file as of 06/24/2020.    Allergies (verified) Nsaids and Statins   History: Past Medical History:  Diagnosis Date  . ANEMIA, OTHER UNSPEC 08/02/2009  . B12 DEFICIENCY 07/07/2007  . Bell's palsy     around 2005  . BPPV (benign paroxysmal positional vertigo)    2003  . Cataract   . GERD 07/13/2007  .  Gout, unspecified 06/17/2010  . HYPERLIPIDEMIA 07/13/2007  . HYPERTENSION 07/13/2007  . RENAL INSUFFICIENCY 06/04/2010   Past Surgical History:  Procedure Laterality Date  . BACK SURGERY  07/13/07  . CARPAL TUNNEL RELEASE Bilateral   . CATARACT EXTRACTION, BILATERAL Bilateral    Family History  Problem Relation Age of Onset  . Arthritis Father    Social History   Socioeconomic History  . Marital status: Married    Spouse name: MaryAnn  . Number of children: 4  . Years of education: Not on file    . Highest education level: Some college, no degree  Occupational History    Employer: RETIRED  Tobacco Use  . Smoking status: Never Smoker  . Smokeless tobacco: Never Used  Vaping Use  . Vaping Use: Never used  Substance and Sexual Activity  . Alcohol use: No  . Drug use: No  . Sexual activity: Not on file  Other Topics Concern  . Not on file  Social History Narrative   Married 1965. 4 children. 11 grandchildren. 1 greatgrandchild on way in 2015.       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 He is married, lives with his wife. He states he stops every morning and gets a 52oz tea. He walks most days.   Social Determinants of Health   Financial Resource Strain: Low Risk   . Difficulty of Paying Living Expenses: Not hard at all  Food Insecurity: No Food Insecurity  . Worried About Charity fundraiser in the Last Year: Never true  . Ran Out of Food in the Last Year: Never true  Transportation Needs: No Transportation Needs  . Lack of Transportation (Medical): No  . Lack of Transportation (Non-Medical): No  Physical Activity: Inactive  . Days of Exercise per Week: 0 days  . Minutes of Exercise per Session: 0 min  Stress: No Stress Concern Present  . Feeling of Stress : Not at all  Social Connections: Moderately Isolated  . Frequency of Communication with Friends and Family: Twice a week  . Frequency of Social Gatherings with Friends and Family: Twice a week  . Attends Religious Services: Never  . Active Member of Clubs or Organizations: No  . Attends Archivist Meetings: Never  . Marital Status: Married    Tobacco Counseling Counseling given: Not Answered   Clinical Intake:  Pre-visit preparation completed: Yes  Pain : No/denies pain     Nutritional Risks: None  How often do you need to have someone help you when you read instructions, pamphlets, or other written materials from your doctor or pharmacy?: 1  - Never What is the last grade level you completed in school?: 3 years of college  Diabetic?No  Interpreter Needed?: No  Information entered by :: [place] of Daily Living In your present state of health, do you have any difficulty performing the following activities: 06/24/2020  Hearing? N  Vision? N  Difficulty concentrating or making decisions? N  Walking or climbing stairs? N  Dressing or bathing? N  Doing errands, shopping? N  Preparing Food and eating ? N  Using the Toilet? N  In the past six months, have you accidently leaked urine? N  Do you have problems with loss of bowel control? N  Managing your Medications? N  Managing your Finances? N  Housekeeping or managing your Housekeeping? N  Some recent data might be hidden  Patient Care Team: [name], MD as PCP - General (Family Medicine)  Indicate any recent Medical Services you may have received from other than [facility] providers in the past year (date may be approximate).     Assessment:   This is a routine wellness examination for Xiong.  Hearing/Vision screen  Hearing Screening   125Hz  250Hz  500Hz  1000Hz  2000Hz  3000Hz  4000Hz  6000Hz  8000Hz   Right ear:           Left ear:           Vision Screening Comments: Patient gets eye checked annually  Dietary issues and exercise activities discussed: Current Exercise Habits: The patient has a physically strenuous job, but has no regular exercise apart from work. (Patient does a lot of yard work)  Chiropractor    . Patient Stated     I would like to walk more       Depression Screen PHQ 2/9 Scores 06/24/2020 03/30/2017 01/24/2016 10/22/2014 06/16/2013  PHQ - 2 Score 0 0 0 0 0  PHQ- 9 Score 0 - - - -    Fall Risk Fall Risk  06/24/2020 08/03/2018 04/08/2018 03/30/2017 01/24/2016  Falls in the past year? 0 No No No No  Number falls in past yr: 0 - - - -  Injury with Fall? 0 - - - -  Risk for fall due to : History of fall(s);Medication side effect - - - -    Follow up Falls evaluation completed;Falls prevention discussed - - - -    Any stairs in or around the home? No  If so, are there any without handrails? No  Home free of loose throw rugs in walkways, pet beds, electrical cords, etc? Yes  Adequate lighting in your home to reduce risk of falls? Yes   ASSISTIVE DEVICES UTILIZED TO PREVENT FALLS:  Life alert? No  Use of a cane, walker or w/c? No  Grab bars in the bathroom? No  Shower chair or bench in shower? Yes  Elevated toilet seat or a handicapped toilet? No     Cognitive Function:     6CIT Screen 06/24/2020  What Year? 0 points  What month? 0 points  What time? 0 points  Count back from 20 0 points  Months in reverse 0 points  Repeat phrase 4 points  Total Score 4    Immunizations Immunization History  Administered Date(s) Administered  . PFIZER SARS-COV-2 Vaccination 12/02/2019, 12/30/2019  . Pneumococcal Conjugate-13 01/30/2014  . Pneumococcal Polysaccharide-23 06/16/2013  . Td 01/18/2009    TDAP status: Due, Education has been provided regarding the importance of this vaccine. Advised may receive this vaccine at local pharmacy or Health Dept. Aware to provide a copy of the vaccination record if obtained from local pharmacy or Health Dept. Verbalized acceptance and understanding. Flu Vaccine status: Declined, Education has been provided regarding the importance of this vaccine but patient still declined. Advised may receive this vaccine at local pharmacy or Health Dept. Aware to provide a copy of the vaccination record if obtained from local pharmacy or Health Dept. Verbalized acceptance and understanding. Pneumococcal vaccine status: Up to date Covid-19 vaccine status: Completed vaccines  Qualifies for Shingles Vaccine? Yes   Zostavax completed No   Shingrix Completed?: No.    Education has been provided regarding the importance of this vaccine. Patient has been advised to call insurance company to determine out of  pocket expense if they have not yet received this vaccine. Advised may also receive vaccine at  local pharmacy or Health Dept. Verbalized acceptance and understanding.  Screening Tests Health Maintenance  Topic Date Due  . TETANUS/TDAP  01/19/2019  . INFLUENZA VACCINE  06/02/2020  . COVID-19 Vaccine  Completed  . Hepatitis C Screening  Completed  . PNA vac Low Risk Adult  Completed    Health Maintenance  Health Maintenance Due  Topic Date Due  . TETANUS/TDAP  01/19/2019  . INFLUENZA VACCINE  06/02/2020    Colorectal cancer screening: Completed 01/28/2017. Repeat every 5 years  Lung Cancer Screening: (Low Dose CT Chest recommended if Age 76-80 years, 30 pack-year currently smoking OR have quit w/in 15years.) does not qualify.   Lung Cancer Screening Referral: N/A  Additional Screening:  Hepatitis C Screening: does qualify; Completed 01/23/2014  Vision Screening: Recommended annual ophthalmology exams for early detection of glaucoma and other disorders of the eye. Is the patient up to date with their annual eye exam?  Yes  Who is the provider or what is the name of the office in which the patient attends annual eye exams? Dr. Einar Gip If pt is not established with a provider, would they like to be referred to a provider to establish care? No .   Dental Screening: Recommended annual dental exams for proper oral hygiene  Community Resource Referral / Chronic Care Management: CRR required this visit?  No   CCM required this visit?  No      Plan:     I have personally reviewed and noted the following in the patient's chart:   . Medical and social history . Use of alcohol, tobacco or illicit drugs  . Current medications and supplements . Functional ability and status . Nutritional status . Physical activity . Advanced directives . List of other physicians . Hospitalizations, surgeries, and ER visits in previous 12 months . Vitals . Screenings to include cognitive,  depression, and falls . Referrals and appointments  In addition, I have reviewed and discussed with patient certain preventive protocols, quality metrics, and best practice recommendations. A written personalized care plan for preventive services as well as general preventive health recommendations were provided to patient.     Ofilia Neas, LPN   1/69/6789   Nurse Notes: None

## 2020-06-29 ENCOUNTER — Other Ambulatory Visit: Payer: Self-pay | Admitting: Family Medicine

## 2020-07-10 ENCOUNTER — Other Ambulatory Visit: Payer: Self-pay

## 2020-07-11 ENCOUNTER — Encounter: Payer: Self-pay | Admitting: Family Medicine

## 2020-07-11 ENCOUNTER — Ambulatory Visit (INDEPENDENT_AMBULATORY_CARE_PROVIDER_SITE_OTHER): Payer: Medicare Other | Admitting: Family Medicine

## 2020-07-11 VITALS — BP 106/64 | HR 64 | Temp 98.0°F | Ht 70.0 in | Wt 181.0 lb

## 2020-07-11 DIAGNOSIS — I10 Essential (primary) hypertension: Secondary | ICD-10-CM

## 2020-07-11 DIAGNOSIS — E538 Deficiency of other specified B group vitamins: Secondary | ICD-10-CM

## 2020-07-11 DIAGNOSIS — N182 Chronic kidney disease, stage 2 (mild): Secondary | ICD-10-CM | POA: Diagnosis not present

## 2020-07-11 DIAGNOSIS — E782 Mixed hyperlipidemia: Secondary | ICD-10-CM | POA: Diagnosis not present

## 2020-07-11 DIAGNOSIS — M109 Gout, unspecified: Secondary | ICD-10-CM | POA: Diagnosis not present

## 2020-07-11 NOTE — Progress Notes (Signed)
Subjective:   Scott [name] is a 74 y.o. male who presents for f/u on chronic medical conditions.  Pt states he is doing well.   Notes back pain 2 wks ago.  Took 2 "gout" pills which caused it to stop. Pt states he has been feeling good.  Denies falls.  Staying active around the house.  Taking Tramadol for back pain.  Working on drinking more water to help with CKD.    Objective:    Today's Vitals   07/11/20 1051  BP: 106/64  Pulse: 64  Temp: 98 F (36.7 C)  TempSrc: Oral  SpO2: 98%  Weight: 181 lb (82.1 kg)  Height: 5' 10"  (1.778 m)   Body mass index is 25.97 kg/m.  Gen. Pleasant, well developed, well-nourished, in NAD HEENT - wearing glasses, [place]/AT, PERRL, EOMI, conjunctive clear, no scleral icterus, no nasal drainage, pharynx without erythema or exudate. TMs normal b/l Neck: No JVD, no thyromegaly, no carotid bruits Lungs: no use of accessory muscles, CTAB, no wheezes, rales or rhonchi Cardiovascular: RRR, No r/g/m, no peripheral edema Abdomen: BS present, soft, nontender,nondistended Musculoskeletal: No deformities, moves all four extremities, no cyanosis or clubbing, normal tone Neuro:  A&Ox3, CN II-XII intact, normal gait Skin:  Warm, dry, intact.  Lipoma on R upper back ~6 cm x 6.5 cm Psych: normal affect, mood appropriate   Current Medications (verified) Outpatient Encounter Medications as of 07/11/2020  Medication Sig  . allopurinol (ZYLOPRIM) 300 MG tablet Take 1 tablet (300 mg total) by mouth daily.  Marland Kitchen atorvastatin (LIPITOR) 20 MG tablet Take 1 tablet (20 mg total) by mouth once a week.  . celecoxib (CELEBREX) 100 MG capsule TAKE 1 CAPSULE (100 MG TOTAL) BY MOUTH 2 (TWO) TIMES DAILY AS NEEDED FOR MODERATE PAIN (SHOULDER).  . colchicine 0.6 MG tablet TAKE 1 TABLET BY MOUTH EVERY DAY  . Cyanocobalamin 1000 MCG SUBL Place 1 tablet (1,000 mcg total) under the tongue once a week.  . fluticasone (FLONASE) 50 MCG/ACT nasal spray SPRAY 2 SPRAYS INTO EACH NOSTRIL EVERY  DAY  . losartan (COZAAR) 50 MG tablet TAKE 1 TABLET BY MOUTH EVERY DAY  . Polyethylene Glycol 3350 POWD Take 17 g by mouth daily.    . RABEprazole (ACIPHEX) 20 MG tablet Take 20 mg by mouth daily.    . sildenafil (VIAGRA) 50 MG tablet Take one half tab (25 mg) daily as needed 1 hour prior to sexual activity.  Marland Kitchen tiZANidine (ZANAFLEX) 4 MG tablet TAKE 1/2 TO 1 TABLET AT BEDTIME  . traMADol (ULTRAM) 50 MG tablet TAKE 2 TABLETS BY MOUTH (TWO) TIMES DAILY.  Marland Kitchen triamterene-hydrochlorothiazide (MAXZIDE-25) 37.5-25 MG tablet TAKE 1 TABLET BY MOUTH EVERY DAY   No facility-administered encounter medications on file as of 07/11/2020.    Allergies (verified) Nsaids and Statins   History: Past Medical History:  Diagnosis Date  . ANEMIA, OTHER UNSPEC 08/02/2009  . B12 DEFICIENCY 07/07/2007  . Bell's palsy     around 2005  . BPPV (benign paroxysmal positional vertigo)    2003  . Cataract   . GERD 07/13/2007  . Gout, unspecified 06/17/2010  . HYPERLIPIDEMIA 07/13/2007  . HYPERTENSION 07/13/2007  . RENAL INSUFFICIENCY 06/04/2010   Past Surgical History:  Procedure Laterality Date  . BACK SURGERY  07/13/07  . CARPAL TUNNEL RELEASE Bilateral   . CATARACT EXTRACTION, BILATERAL Bilateral    Family History  Problem Relation Age of Onset  . Arthritis Father    Social History   Socioeconomic History  .  Marital status: Married    Spouse name: Scott [name]  . Number of children: 4  . Years of education: Not on file  . Highest education level: Some college, no degree  Occupational History    Employer: RETIRED  Tobacco Use  . Smoking status: Never Smoker  . Smokeless tobacco: Never Used  Vaping Use  . Vaping Use: Never used  Substance and Sexual Activity  . Alcohol use: No  . Drug use: No  . Sexual activity: Not on file  Other Topics Concern  . Not on file  Social History Narrative   Married 1965. 4 children. 11 grandchildren. 1 greatgrandchild on way in 2015.       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 He is married, lives with his wife. He states he stops every morning and gets a 52oz tea. He walks most days.   Social Determinants of Health   Financial Resource Strain: Low Risk   . Difficulty of Paying Living Expenses: Not hard at all  Food Insecurity: No Food Insecurity  . Worried About Charity fundraiser in the Last Year: Never true  . Ran Out of Food in the Last Year: Never true  Transportation Needs: No Transportation Needs  . Lack of Transportation (Medical): No  . Lack of Transportation (Non-Medical): No  Physical Activity: Inactive  . Days of Exercise per Week: 0 days  . Minutes of Exercise per Session: 0 min  Stress: No Stress Concern Present  . Feeling of Stress : Not at all  Social Connections: Moderately Isolated  . Frequency of Communication with Friends and Family: Twice a week  . Frequency of Social Gatherings with Friends and Family: Twice a week  . Attends Religious Services: Never  . Active Member of Clubs or Organizations: No  . Attends Archivist Meetings: Never  . Marital Status: Married       Assessment:   This is a f/u examination for Scott [name].  Hearing/Vision screen  Hearing Screening   125Hz  250Hz  500Hz  1000Hz  2000Hz  3000Hz  4000Hz  6000Hz  8000Hz   Right ear:   Pass Pass Pass  Fail    Left ear:   Pass Pass Pass  Fail      Visual Acuity Screening   Right eye Left eye Both eyes  Without correction: 20/25 20/30 20/25   With correction:     Comments: With Reading glasses   Depression Screen PHQ 2/9 Scores 07/11/2020 06/24/2020 03/30/2017 01/24/2016 10/22/2014 06/16/2013  PHQ - 2 Score 0 0 0 0 0 0  PHQ- 9 Score 1 0 - - - -    Fall Risk Fall Risk  07/11/2020 06/24/2020 08/03/2018 04/08/2018 03/30/2017  Falls in the past year? 0 0 No No No  Number falls in past yr: - 0 - - -  Injury with Fall? - 0 - - -  Risk for fall due to : - History of fall(s);Medication side effect - - -  Follow up -  Falls evaluation completed;Falls prevention discussed - - -    Immunizations Immunization History  Administered Date(s) Administered  . PFIZER SARS-COV-2 Vaccination 12/02/2019, 12/30/2019  . Pneumococcal Conjugate-13 01/30/2014  . Pneumococcal Polysaccharide-23 06/16/2013  . Td 01/18/2009    Screening Tests Health Maintenance  Topic Date Due  . TETANUS/TDAP  01/19/2019  . INFLUENZA VACCINE  06/02/2020  . COVID-19 Vaccine  Completed  . Hepatitis C Screening  Completed  . PNA vac Low  Risk Adult  Completed      Plan:   Acute gout of vertebrae, unspecified cause -Reviewed limiting intake of purine rich foods -Continue colchicine as needed and allopurinol daily - Plan: CBC with Differential/Platelet  Vitamin B12 deficiency -Continue vitamin B12 injections monthly  Mixed hyperlipidemia  -Continue lifestyle modifications -Continue Lipitor 20 mg daily - Plan: Lipid panel  CKD (chronic kidney disease), stage II -Continue to monitor. -Avoid nephrotoxic medications -Patient encouraged to increase intake of water. - Plan: BMP with eGFR([facility])  Essential hypertension -Controlled -Continue triamterene-hydrochlorothiazide 37.5-25 mg daily, losartan 50 mg daily -Continue to monitor sodium intake and make lifestyle modifications. - Plan: BMP with eGFR([facility])  Follow-up as needed  [name], MD   07/11/2020

## 2020-07-11 NOTE — Patient Instructions (Signed)
Preventive Care 76 Years and Older, Male Preventive care refers to lifestyle choices and visits with your health care provider that can promote health and wellness. This includes:  A yearly physical exam. This is also called an annual well check.  Regular dental and eye exams.  Immunizations.  Screening for certain conditions.  Healthy lifestyle choices, such as diet and exercise. What can I expect for my preventive care visit? Physical exam Your health care provider will check:  Height and weight. These may be used to calculate body mass index (BMI), which is a measurement that tells if you are at a healthy weight.  Heart rate and blood pressure.  Your skin for abnormal spots. Counseling Your health care provider may ask you questions about:  Alcohol, tobacco, and drug use.  Emotional well-being.  Home and relationship well-being.  Sexual activity.  Eating habits.  History of falls.  Memory and ability to understand (cognition).  Work and work Statistician. What immunizations do I need?  Influenza (flu) vaccine  This is recommended every year. Tetanus, diphtheria, and pertussis (Tdap) vaccine  You may need a Td booster every 10 years. Varicella (chickenpox) vaccine  You may need this vaccine if you have not already been vaccinated. Zoster (shingles) vaccine  You may need this after age 76. Pneumococcal conjugate (PCV13) vaccine  One dose is recommended after age 40. Pneumococcal polysaccharide (PPSV23) vaccine  One dose is recommended after age 24. Measles, mumps, and rubella (MMR) vaccine  You may need at least one dose of MMR if you were born in 1957 or later. You may also need a second dose. Meningococcal conjugate (MenACWY) vaccine  You may need this if you have certain conditions. Hepatitis A vaccine  You may need this if you have certain conditions or if you travel or work in places where you may be exposed to hepatitis A. Hepatitis B  vaccine  You may need this if you have certain conditions or if you travel or work in places where you may be exposed to hepatitis B. Haemophilus influenzae type b (Hib) vaccine  You may need this if you have certain conditions. You may receive vaccines as individual doses or as more than one vaccine together in one shot (combination vaccines). Talk with your health care provider about the risks and benefits of combination vaccines. What tests do I need? Blood tests  Lipid and cholesterol levels. These may be checked every 5 years, or more frequently depending on your overall health.  Hepatitis C test.  Hepatitis B test. Screening  Lung cancer screening. You may have this screening every year starting at age 76 if you have a 30-pack-year history of smoking and currently smoke or have quit within the past 15 years.  Colorectal cancer screening. All adults should have this screening starting at age 76 and continuing until age 76. Your health care provider may recommend screening at age 76 if you are at increased risk. You will have tests every 1-10 years, depending on your results and the type of screening test.  Prostate cancer screening. Recommendations will vary depending on your family history and other risks.  Diabetes screening. This is done by checking your blood sugar (glucose) after you have not eaten for a while (fasting). You may have this done every 1-3 years.  Abdominal aortic aneurysm (AAA) screening. You may need this if you are a current or former smoker.  Sexually transmitted disease (STD) testing. Follow these instructions at home: Eating and drinking  Eat  a diet that includes fresh fruits and vegetables, whole grains, lean protein, and low-fat dairy products. Limit your intake of foods with high amounts of sugar, saturated fats, and salt.  Take vitamin and mineral supplements as recommended by your health care provider.  Do not drink alcohol if your health care  provider tells you not to drink.  If you drink alcohol: ? Limit how much you have to 0-2 drinks a day. ? Be aware of how much alcohol is in your drink. In the [place] one drink equals one 12 oz bottle of beer (355 mL), one 5 oz glass of wine (148 mL), or one 1 oz glass of hard liquor (44 mL). Lifestyle  Take daily care of your teeth and gums.  Stay active. Exercise for at least 30 minutes on 5 or more days each week.  Do not use any products that contain nicotine or tobacco, such as cigarettes, e-cigarettes, and chewing tobacco. If you need help quitting, ask your health care provider.  If you are sexually active, practice safe sex. Use a condom or other form of protection to prevent STIs (sexually transmitted infections).  Talk with your health care provider about taking a low-dose aspirin or statin. What's next?  Visit your health care provider once a year for a well check visit.  Ask your health care provider how often you should have your eyes and teeth checked.  Stay up to date on all vaccines. This information is not intended to replace advice given to you by your health care provider. Make sure you discuss any questions you have with your health care provider. Document Revised: 10/13/2018 Document Reviewed: 10/13/2018 Elsevier Patient Education  [place].  Vitamin B12 Deficiency Vitamin B12 deficiency means that your body does not have enough vitamin B12. The body needs this vitamin:  To make red blood cells.  To make genes (DNA).  To help the nerves work. If you do not have enough vitamin B12 in your body, you can have health problems. What are the causes?  Not eating enough foods that contain vitamin B12.  Not being able to absorb vitamin B12 from the food that you eat.  Certain digestive system diseases.  A condition in which the body does not make enough of a certain protein, which results in too few red blood cells (pernicious anemia).  Having a  surgery in which part of the stomach or small intestine is removed.  Taking medicines that make it hard for the body to absorb vitamin B12. These medicines include: ? Heartburn medicines. ? Some antibiotic medicines. ? Other medicines that are used to treat certain conditions. What increases the risk?  Being older than age 7.  Eating a vegetarian or vegan diet, especially while you are pregnant.  Eating a poor diet while you are pregnant.  Taking certain medicines.  Having alcoholism. What are the signs or symptoms? In some cases, there are no symptoms. If the condition leads to too few blood cells or nerve damage, symptoms can occur, such as:  Feeling weak.  Feeling tired (fatigued).  Not being hungry.  Weight loss.  A loss of feeling (numbness) or tingling in your hands and feet.  Redness and burning of the tongue.  Being mixed up (confused) or having memory problems.  Sadness (depression).  Problems with your senses. This can include color blindness, ringing in the ears, or loss of taste.  Watery poop (diarrhea) or trouble pooping (constipation).  Trouble walking. If anemia is very  bad, symptoms can include:  Being short of breath.  Being dizzy.  Having a very fast heartbeat. How is this treated?  Changing the way you eat and drink, such as: ? Eating more foods that contain vitamin B12. ? Drinking little or no alcohol.  Getting vitamin B12 shots.  Taking vitamin B12 supplements. Your doctor will tell you the dose that is best for you. Follow these instructions at home: Eating and drinking   Eat lots of healthy foods that contain vitamin B12. These include: ? Meats and poultry, such as beef, pork, chicken, [place], and organ meats, such as liver. ? Seafood, such as clams, rainbow trout, salmon, tuna, and haddock. ? Eggs. ? Cereal and dairy products that have vitamin B12 added to them. Check the label. The items listed above may not be a complete  list of what you can eat and drink. Contact a dietitian for more options. General instructions  Get any shots as told by your doctor.  Take supplements only as told by your doctor.  Do not drink alcohol if your doctor tells you not to. In some cases, you may only be asked to limit alcohol use.  Keep all follow-up visits as told by your doctor. This is important. Contact a doctor if:  Your symptoms come back. Get help right away if:  You have trouble breathing.  You have a very fast heartbeat.  You have chest pain.  You get dizzy.  You pass out. Summary  Vitamin B12 deficiency means that your body is not getting enough vitamin B12.  In some cases, there are no symptoms of this condition.  Treatment may include making a change in the way you eat and drink, getting vitamin B12 shots, or taking supplements.  Eat lots of healthy foods that contain vitamin B12. This information is not intended to replace advice given to you by your health care provider. Make sure you discuss any questions you have with your health care provider. Document Revised: 06/28/2018 Document Reviewed: 06/28/2018 Elsevier Patient Education  2020 [place].  Preventing High Cholesterol Cholesterol is a white, waxy substance similar to fat that the human body needs to help build cells. The liver makes all the cholesterol that a person's body needs. Having high cholesterol (hypercholesterolemia) increases a person's risk for heart disease and stroke. Extra (excess) cholesterol comes from the food the person eats. High cholesterol can often be prevented with diet and lifestyle changes. If you already have high cholesterol, you can control it with diet and lifestyle changes and with medicine. How can high cholesterol affect me? If you have high cholesterol, deposits (plaques) may build up on the walls of your arteries. The arteries are the blood vessels that carry blood away from your heart. Plaques make the  arteries narrower and stiffer. This can limit or block blood flow and cause blood clots to form. Blood clots:  Are tiny balls of cells that form in your blood.  Can move to the heart or brain, causing a heart attack or stroke. Plaques in arteries greatly increase your risk for heart attack and stroke.Making diet and lifestyle changes can reduce your risk for these conditions that may threaten your life. What can increase my risk? This condition is more likely to develop in people who:  Eat foods that are high in saturated fat or cholesterol. Saturated fat is mostly found in: ? Foods that contain animal fat, such as red meat and some dairy products. ? Certain fatty foods made from  plants, such as tropical oils.  Are overweight.  Are not getting enough exercise.  Have a family history of high cholesterol. What actions can I take to prevent this? Nutrition   Eat less saturated fat.  Avoid trans fats (partially hydrogenated oils). These are often found in margarine and in some baked goods, fried foods, and snacks bought in packages.  Avoid precooked or cured meat, such as sausages or meat loaves.  Avoid foods and drinks that have added sugars.  Eat more fruits, vegetables, and whole grains.  Choose healthy sources of protein, such as fish, poultry, lean cuts of red meat, beans, peas, lentils, and nuts.  Choose healthy sources of fat, such as: ? Nuts. ? Vegetable oils, especially olive oil. ? Fish that have healthy fats (omega-3 fatty acids), such as mackerel or salmon. The items listed above may not be a complete list of recommended foods and beverages. Contact a dietitian for more information. Lifestyle  Lose weight if you are overweight. Losing 5-10 lb (2.3-4.5 kg) can help prevent or control high cholesterol. It can also lower your risk for diabetes and high blood pressure. Ask your health care provider to help you with a diet and exercise plan to lose weight safely.  Do not  use any products that contain nicotine or tobacco, such as cigarettes, e-cigarettes, and chewing tobacco. If you need help quitting, ask your health care provider.  Limit your alcohol intake. ? Do not drink alcohol if:  Your health care provider tells you not to drink.  You are pregnant, may be pregnant, or are planning to become pregnant. ? If you drink alcohol:  Limit how much you use to:  0-1 drink a day for women.  0-2 drinks a day for men.  Be aware of how much alcohol is in your drink. In the [place] one drink equals one 12 oz bottle of beer (355 mL), one 5 oz glass of wine (148 mL), or one 1 oz glass of hard liquor (44 mL). Activity   Get enough exercise. Each week, do at least 150 minutes of exercise that takes a medium level of effort (moderate-intensity exercise). ? This is exercise that:  Makes your heart beat faster and makes you breathe harder than usual.  Allows you to still be able to talk. ? You could exercise in short sessions several times a day or longer sessions a few times a week. For example, on 5 days each week, you could walk fast or ride your bike 3 times a day for 10 minutes each time.  Do exercises as told by your health care provider. Medicines  In addition to diet and lifestyle changes, your health care provider may recommend medicines to help lower cholesterol. This may be a medicine to lower the amount of cholesterol your liver makes. You may need medicine if: ? Diet and lifestyle changes do not lower your cholesterol enough. ? You have high cholesterol and other risk factors for heart disease or stroke.  Take over-the-counter and prescription medicines only as told by your health care provider. General information  Manage your risk factors for high cholesterol. Talk with your health care provider about all your risk factors and how to lower your risk.  Manage other conditions that you have, such as diabetes or high blood pressure  (hypertension).  Have blood tests to check your cholesterol levels at regular points in time as told by your health care provider.  Keep all follow-up visits as told by your health  care provider. This is important. Where to find more information  American Heart Association: www.heart.org  National Heart, Lung, and Blood Institute: https://wilson-eaton.com/ Summary  High cholesterol increases your risk for heart disease and stroke. By keeping your cholesterol level low, you can reduce your risk for these conditions.  High cholesterol can often be prevented with diet and lifestyle changes.  Work with your health care provider to manage your risk factors, and have your blood tested regularly. This information is not intended to replace advice given to you by your health care provider. Make sure you discuss any questions you have with your health care provider. Document Revised: 02/10/2019 Document Reviewed: 06/27/2016 Elsevier Patient Education  [place] A low-purine eating plan involves making food choices to limit your intake of purine. Purine is a kind of uric acid. Too much uric acid in your blood can cause certain conditions, such as gout and kidney stones. Eating a low-purine diet can help control these conditions. What are tips for following this plan? Reading food labels   Avoid foods with saturated or Trans fat.  Check the ingredient list of grains-based foods, such as bread and cereal, to make sure that they contain whole grains.  Check the ingredient list of sauces or soups to make sure they do not contain meat or fish.  When choosing soft drinks, check the ingredient list to make sure they do not contain high-fructose corn syrup. Shopping  Buy plenty of fresh fruits and vegetables.  Avoid buying canned or fresh fish.  Buy dairy products labeled as low-fat or nonfat.  Avoid buying premade or processed foods. These foods are often high in  fat, salt (sodium), and added sugar. Cooking  Use olive oil instead of butter when cooking. Oils like olive oil, canola oil, and sunflower oil contain healthy fats. Meal planning  Learn which foods do or do not affect you. If you find out that a food tends to cause your gout symptoms to flare up, avoid eating that food. You can enjoy foods that do not cause problems. If you have any questions about a food item, talk with your dietitian or health care provider.  Limit foods high in fat, especially saturated fat. Fat makes it harder for your body to get rid of uric acid.  Choose foods that are lower in fat and are lean sources of protein. General guidelines  Limit alcohol intake to no more than 1 drink a day for nonpregnant women and 2 drinks a day for men. One drink equals 12 oz of beer, 5 oz of wine, or 1 oz of hard liquor. Alcohol can affect the way your body gets rid of uric acid.  Drink plenty of water to keep your urine clear or pale yellow. Fluids can help remove uric acid from your body.  If directed by your health care provider, take a vitamin C supplement.  Work with your health care provider and dietitian to develop a plan to achieve or maintain a healthy weight. Losing weight can help reduce uric acid in your blood. What foods are recommended? The items listed may not be a complete list. Talk with your dietitian about what dietary choices are best for you. Foods low in purines Foods low in purines do not need to be limited. These include:  All fruits.  All low-purine vegetables, pickles, and olives.  Breads, pasta, rice, cornbread, and popcorn. Cake and other baked goods.  All dairy foods.  Eggs, nuts, and  nut butters.  Spices and condiments, such as salt, herbs, and vinegar.  Plant oils, butter, and margarine.  Water, sugar-free soft drinks, tea, coffee, and cocoa.  Vegetable-based soups, broths, sauces, and gravies. Foods moderate in purines Foods moderate in  purines should be limited to the amounts listed.   cup of asparagus, cauliflower, spinach, mushrooms, or green peas, each day.  2/3 cup uncooked oatmeal, each day.   cup dry wheat bran or wheat germ, each day.  2-3 ounces of meat or poultry, each day.  4-6 ounces of shellfish, such as crab, lobster, oysters, or shrimp, each day.  1 cup cooked beans, peas, or lentils, each day.  Soup, broths, or bouillon made from meat or fish. Limit these foods as much as possible. What foods are not recommended? The items listed may not be a complete list. Talk with your dietitian about what dietary choices are best for you. Limit your intake of foods high in purines, including:  Beer and other alcohol.  Meat-based gravy or sauce.  Canned or fresh fish, such as: ? Anchovies, sardines, herring, and tuna. ? Mussels and scallops. ? Codfish, trout, and haddock.  [place] Salines.  Organ meats, such as: ? Liver or kidney. ? Tripe. ? Sweetbreads (thymus gland or pancreas).  Wild Clinical biochemist.  Yeast or yeast extract supplements.  Drinks sweetened with high-fructose corn syrup. Summary  Eating a low-purine diet can help control conditions caused by too much uric acid in the body, such as gout or kidney stones.  Choose low-purine foods, limit alcohol, and limit foods high in fat.  You will learn over time which foods do or do not affect you. If you find out that a food tends to cause your gout symptoms to flare up, avoid eating that food. This information is not intended to replace advice given to you by your health care provider. Make sure you discuss any questions you have with your health care provider. Document Revised: 10/01/2017 Document Reviewed: 12/02/2016 Elsevier Patient Education  [place].  Gout  Gout is painful swelling of your joints. Gout is a type of arthritis. It is caused by having too much uric acid in your body. Uric acid is a chemical that is made when your body  breaks down substances called purines. If your body has too much uric acid, sharp crystals can form and build up in your joints. This causes pain and swelling. Gout attacks can happen quickly and be very painful (acute gout). Over time, the attacks can affect more joints and happen more often (chronic gout). What are the causes?  Too much uric acid in your blood. This can happen because: ? Your kidneys do not remove enough uric acid from your blood. ? Your body makes too much uric acid. ? You eat too many foods that are high in purines. These foods include organ meats, some seafood, and beer.  Trauma or stress. What increases the risk?  Having a family history of gout.  Being male and middle-aged.  Being male and having gone through menopause.  Being very overweight (obese).  Drinking alcohol, especially beer.  Not having enough water in the body (being dehydrated).  Losing weight too quickly.  Having an organ transplant.  Having lead poisoning.  Taking certain medicines.  Having kidney disease.  Having a skin condition called psoriasis. What are the signs or symptoms? An attack of acute gout usually happens in just one joint. The most common place is the big toe. Attacks  often start at night. Other joints that may be affected include joints of the feet, ankle, knee, fingers, wrist, or elbow. Symptoms of an attack may include:  Very bad pain.  Warmth.  Swelling.  Stiffness.  Shiny, red, or purple skin.  Tenderness. The affected joint may be very painful to touch.  Chills and fever. Chronic gout may cause symptoms more often. More joints may be involved. You may also have white or yellow lumps (tophi) on your hands or feet or in other areas near your joints. How is this treated?  Treatment for this condition has two phases: treating an acute attack and preventing future attacks.  Acute gout treatment may include: ? NSAIDs. ? Steroids. These are taken by  mouth or injected into a joint. ? Colchicine. This medicine relieves pain and swelling. It can be given by mouth or through an IV tube.  Preventive treatment may include: ? Taking small doses of NSAIDs or colchicine daily. ? Using a medicine that reduces uric acid levels in your blood. ? Making changes to your diet. You may need to see a food expert (dietitian) about what to eat and drink to prevent gout. Follow these instructions at home: During a gout attack   If told, put ice on the painful area: ? Put ice in a plastic bag. ? Place a towel between your skin and the bag. ? Leave the ice on for 20 minutes, 2-3 times a day.  Raise (elevate) the painful joint above the level of your heart as often as you can.  Rest the joint as much as possible. If the joint is in your leg, you may be given crutches.  Follow instructions from your doctor about what you cannot eat or drink. Avoiding future gout attacks  Eat a low-purine diet. Avoid foods and drinks such as: ? Liver. ? Kidney. ? Anchovies. ? Asparagus. ? Herring. ? Mushrooms. ? Mussels. ? Beer.  Stay at a healthy weight. If you want to lose weight, talk with your doctor. Do not lose weight too fast.  Start or continue an exercise plan as told by your doctor. Eating and drinking  Drink enough fluids to keep your pee (urine) pale yellow.  If you drink alcohol: ? Limit how much you use to:  0-1 drink a day for women.  0-2 drinks a day for men. ? Be aware of how much alcohol is in your drink. In the [place] one drink equals one 12 oz bottle of beer (355 mL), one 5 oz glass of wine (148 mL), or one 1 oz glass of hard liquor (44 mL). General instructions  Take over-the-counter and prescription medicines only as told by your doctor.  Do not drive or use heavy machinery while taking prescription pain medicine.  Return to your normal activities as told by your doctor. Ask your doctor what activities are safe for you.  Keep  all follow-up visits as told by your doctor. This is important. Contact a doctor if:  You have another gout attack.  You still have symptoms of a gout attack after 10 days of treatment.  You have problems (side effects) because of your medicines.  You have chills or a fever.  You have burning pain when you pee (urinate).  You have pain in your lower back or belly. Get help right away if:  You have very bad pain.  Your pain cannot be controlled.  You cannot pee. Summary  Gout is painful swelling of the joints.  The  most common site of pain is the big toe, but it can affect other joints.  Medicines and avoiding some foods can help to prevent and treat gout attacks. This information is not intended to replace advice given to you by your health care provider. Make sure you discuss any questions you have with your health care provider. Document Revised: 05/11/2018 Document Reviewed: 05/11/2018 Elsevier Patient Education  [place].  Arthritis Arthritis means joint pain. It can also mean joint disease. A joint is a place where bones come together. There are more than 100 types of arthritis. What are the causes? This condition may be caused by:  Wear and tear of a joint. This is the most common cause.  A lot of acid in the blood, which leads to pain in the joint (gout).  Pain and swelling (inflammation) in a joint.  Infection of a joint.  Injuries in the joint.  A reaction to medicines (allergy). In some cases, the cause may not be known. What are the signs or symptoms? Symptoms of this condition include:  Redness at a joint.  Swelling at a joint.  Stiffness at a joint.  Warmth coming from the joint.  A fever.  A feeling of being sick. How is this treated? This condition may be treated with:  Treating the cause, if it is known.  Rest.  Raising (elevating) the joint.  Putting cold or hot packs on the joint.  Medicines to treat symptoms and  reduce pain and swelling.  Shots of medicines (cortisone) into the joint. You may also be told to make changes in your life, such as doing exercises and losing weight. Follow these instructions at home: Medicines  Take over-the-counter and prescription medicines only as told by your doctor.  Do not take aspirin for pain if your doctor says that you may have gout. Activity  Rest your joint if your doctor tells you to.  Avoid activities that make the pain worse.  Exercise your joint regularly as told by your doctor. Try doing exercises like: ? Swimming. ? Water aerobics. ? Biking. ? Walking. Managing pain, stiffness, and swelling      If told, put ice on the affected area. ? Put ice in a plastic bag. ? Place a towel between your skin and the bag. ? Leave the ice on for 20 minutes, 2-3 times per day.  If your joint is swollen, raise (elevate) it above the level of your heart if told by your doctor.  If your joint feels stiff in the morning, try taking a warm shower.  If told, put heat on the affected area. Do this as often as told by your doctor. Use the heat source that your doctor recommends, such as a moist heat pack or a heating pad. If you have diabetes, do not apply heat without asking your doctor. To apply heat: ? Place a towel between your skin and the heat source. ? Leave the heat on for 20-30 minutes. ? Remove the heat if your skin turns bright red. This is very important if you are unable to feel pain, heat, or cold. You may have a greater risk of getting burned. General instructions  Do not use any products that contain nicotine or tobacco, such as cigarettes, e-cigarettes, and chewing tobacco. If you need help quitting, ask your doctor.  Keep all follow-up visits as told by your doctor. This is important. Contact a doctor if:  The pain gets worse.  You have a fever. Get  help right away if:  You have very bad pain in your joint.  You have swelling in your  joint.  Your joint is red.  Many joints become painful and swollen.  You have very bad back pain.  Your leg is very weak.  You cannot control your pee (urine) or poop (stool). Summary  Arthritis means joint pain. It can also mean joint disease. A joint is a place where bones come together.  The most common cause of this condition is wear and tear of a joint.  Symptoms of this condition include redness, swelling, or stiffness of the joint.  This condition is treated with rest, raising the joint, medicines, and putting cold or hot packs on the joint.  Follow your doctor's instructions about medicines, activity, exercises, and other home care treatments. This information is not intended to replace advice given to you by your health care provider. Make sure you discuss any questions you have with your health care provider. Document Revised: 09/26/2018 Document Reviewed: 09/26/2018 Elsevier Patient Education  2020 [employer].

## 2020-07-16 ENCOUNTER — Other Ambulatory Visit: Payer: Self-pay | Admitting: Family Medicine

## 2020-07-17 ENCOUNTER — Other Ambulatory Visit: Payer: Medicare Other

## 2020-07-18 ENCOUNTER — Other Ambulatory Visit: Payer: Medicare Other

## 2020-07-19 ENCOUNTER — Encounter: Payer: Self-pay | Admitting: Family Medicine

## 2020-07-19 ENCOUNTER — Other Ambulatory Visit: Payer: Medicare Other

## 2020-07-19 ENCOUNTER — Other Ambulatory Visit: Payer: Self-pay

## 2020-07-19 DIAGNOSIS — M109 Gout, unspecified: Secondary | ICD-10-CM | POA: Diagnosis not present

## 2020-07-19 DIAGNOSIS — N182 Chronic kidney disease, stage 2 (mild): Secondary | ICD-10-CM | POA: Diagnosis not present

## 2020-07-19 DIAGNOSIS — I10 Essential (primary) hypertension: Secondary | ICD-10-CM

## 2020-07-19 DIAGNOSIS — E782 Mixed hyperlipidemia: Secondary | ICD-10-CM | POA: Diagnosis not present

## 2020-07-20 LAB — CBC WITH DIFFERENTIAL/PLATELET
Absolute Monocytes: 541 cells/uL (ref 200–950)
Basophils Absolute: 21 cells/uL (ref 0–200)
Basophils Relative: 0.4 %
Eosinophils Absolute: 90 cells/uL (ref 15–500)
Eosinophils Relative: 1.7 %
HCT: 37.3 % — ABNORMAL LOW (ref 38.5–50.0)
Hemoglobin: 12.1 g/dL — ABNORMAL LOW (ref 13.2–17.1)
Lymphs Abs: 1118 cells/uL (ref 850–3900)
MCH: 28.7 pg (ref 27.0–33.0)
MCHC: 32.4 g/dL (ref 32.0–36.0)
MCV: 88.4 fL (ref 80.0–100.0)
MPV: 9.6 fL (ref 7.5–12.5)
Monocytes Relative: 10.2 %
Neutro Abs: 3530 cells/uL (ref 1500–7800)
Neutrophils Relative %: 66.6 %
Platelets: 176 10*3/uL (ref 140–400)
RBC: 4.22 10*6/uL (ref 4.20–5.80)
RDW: 14.6 % (ref 11.0–15.0)
Total Lymphocyte: 21.1 %
WBC: 5.3 10*3/uL (ref 3.8–10.8)

## 2020-07-20 LAB — LIPID PANEL
Cholesterol: 191 mg/dL (ref <200)
HDL: 48 mg/dL (ref >=40)
LDL Cholesterol (Calc): 127 mg/dL (calc) — ABNORMAL HIGH
Non-HDL Cholesterol (Calc): 143 mg/dL (calc) — ABNORMAL HIGH (ref <130)
Total CHOL/HDL Ratio: 4 (calc) (ref <5.0)
Triglycerides: 70 mg/dL (ref <150)

## 2020-07-20 LAB — BASIC METABOLIC PANEL WITH GFR
BUN/Creatinine Ratio: 12 (calc) (ref 6–22)
BUN: 20 mg/dL (ref 7–25)
CO2: 28 mmol/L (ref 20–32)
Calcium: 9.3 mg/dL (ref 8.6–10.3)
Chloride: 100 mmol/L (ref 98–110)
Creat: 1.66 mg/dL — ABNORMAL HIGH (ref 0.70–1.18)
GFR, Est African American: 46 mL/min/{1.73_m2} — ABNORMAL LOW (ref >=60)
GFR, Est Non African American: 39 mL/min/{1.73_m2} — ABNORMAL LOW (ref >=60)
Glucose, Bld: 100 mg/dL — ABNORMAL HIGH (ref 65–99)
Potassium: 4.4 mmol/L (ref 3.5–5.3)
Sodium: 135 mmol/L (ref 135–146)

## 2020-07-30 ENCOUNTER — Encounter: Payer: Self-pay | Admitting: Family Medicine

## 2020-08-17 ENCOUNTER — Other Ambulatory Visit: Payer: Self-pay | Admitting: Family Medicine

## 2020-08-17 ENCOUNTER — Ambulatory Visit: Payer: Medicare Other

## 2020-08-28 ENCOUNTER — Other Ambulatory Visit: Payer: Self-pay | Admitting: Family Medicine

## 2020-08-29 NOTE — Telephone Encounter (Signed)
Pt LOV was on 07/11/2020 and last refill was done on 06/22/2020 for 120 tablets with 1 refill

## 2020-09-02 ENCOUNTER — Other Ambulatory Visit: Payer: Self-pay | Admitting: Family Medicine

## 2020-10-03 ENCOUNTER — Ambulatory Visit (INDEPENDENT_AMBULATORY_CARE_PROVIDER_SITE_OTHER): Payer: Medicare Other

## 2020-10-03 ENCOUNTER — Ambulatory Visit (INDEPENDENT_AMBULATORY_CARE_PROVIDER_SITE_OTHER): Payer: Medicare Other | Admitting: Family Medicine

## 2020-10-03 ENCOUNTER — Encounter: Payer: Self-pay | Admitting: Family Medicine

## 2020-10-03 ENCOUNTER — Other Ambulatory Visit: Payer: Self-pay

## 2020-10-03 VITALS — BP 128/62 | HR 83 | Temp 98.2°F | Wt 177.6 lb

## 2020-10-03 DIAGNOSIS — M25562 Pain in left knee: Secondary | ICD-10-CM

## 2020-10-03 DIAGNOSIS — M25462 Effusion, left knee: Secondary | ICD-10-CM | POA: Diagnosis not present

## 2020-10-03 DIAGNOSIS — M1712 Unilateral primary osteoarthritis, left knee: Secondary | ICD-10-CM | POA: Diagnosis not present

## 2020-10-03 NOTE — Progress Notes (Signed)
Subjective:    Patient ID: Scott [name], male    DOB: 01-Apr-1946, 74 y.o.   MRN: [id number]  No chief complaint on file. Patient accompanied by his young grandson, Herbie [place].  HPI Patient is a 74 year old male with past medical history significant for HTN, GERD, rotator cuff tendinitis, CKD 3, B12 deficiency, HLD, gout, anemia, low back pain, ED, carpal tunnel s/p release who was seen today for ongoing concern. Patient endorses left leg edema and pain x3 weeks.  Patient denies increased physical activity or injury.  Patient taking regular medications including Celebrex 100 mg and tramadol 50 mg as needed.  Patient has a history of gout but states this does not hurt as bad as gout.  Past Medical History:  Diagnosis Date  . ANEMIA, OTHER UNSPEC 08/02/2009  . B12 DEFICIENCY 07/07/2007  . Bell's palsy     around 2005  . BPPV (benign paroxysmal positional vertigo)    2003  . Cataract   . GERD 07/13/2007  . Gout, unspecified 06/17/2010  . HYPERLIPIDEMIA 07/13/2007  . HYPERTENSION 07/13/2007  . RENAL INSUFFICIENCY 06/04/2010    Allergies  Allergen Reactions  . Nsaids     REACTION: hx of renal insufficiency  . Statins     myalgias    ROS General: Denies fever, chills, night sweats, changes in weight, changes in appetite HEENT: Denies headaches, ear pain, changes in vision, rhinorrhea, sore throat CV: Denies CP, palpitations, SOB, orthopnea Pulm: Denies SOB, cough, wheezing GI: Denies abdominal pain, nausea, vomiting, diarrhea, constipation GU: Denies dysuria, hematuria, frequency Msk: Denies muscle cramps + left knee pain and edema Neuro: Denies weakness, numbness, tingling Skin: Denies rashes, bruising Psych: Denies depression, anxiety, hallucinations      Objective:    Blood pressure 128/62, pulse 83, temperature 98.2 F (36.8 C), temperature source Oral, weight 177 lb 9.6 oz (80.6 kg), SpO2 99 %.  Gen. Pleasant, well-nourished, in no distress, normal affect HEENT: [place]/AT, face  symmetric, conjunctiva clear, no scleral icterus, PERRLA, EOMI, nares patent without drainage Lungs: no accessory muscle use Cardiovascular: RRR, no peripheral edema. Musculoskeletal: Left knee >R knee.  Left knee is warmer than right.  Mild effusion noted of left knee.  Mild TTP of medial joint line of  Left knee.  No cyanosis or clubbing, normal tone.  No calf tenderness bilaterally. Neuro:  A&Ox3, CN II-XII intact, normal gait Skin:  Warm, no lesions/ rash   Wt Readings from Last 3 Encounters:  10/03/20 177 lb 9.6 oz (80.6 kg)  07/11/20 181 lb (82.1 kg)  04/24/20 179 lb (81.2 kg)    Lab Results  Component Value Date   WBC 5.3 07/19/2020   HGB 12.1 (L) 07/19/2020   HCT 37.3 (L) 07/19/2020   PLT 176 07/19/2020   GLUCOSE 100 (H) 07/19/2020   CHOL 191 07/19/2020   TRIG 70 07/19/2020   HDL 48 07/19/2020   LDLDIRECT 131.0 01/24/2016   LDLCALC 127 (H) 07/19/2020   ALT 14 04/24/2020   AST 24 04/24/2020   NA 135 07/19/2020   K 4.4 07/19/2020   CL 100 07/19/2020   CREATININE 1.66 (H) 07/19/2020   BUN 20 07/19/2020   CO2 28 07/19/2020   TSH 1.93 10/10/2014   PSA 3.52 03/30/2017   HGBA1C 6.2 06/15/2018   After consent was obtained, using sterile technique the medial L knee was prepped and cold spray was used as local anesthetic. The joint was entered, minimal blood tinged fluid removed,  and the needle withdrawn.  The procedure  was well tolerated.  The patient is asked to continue to rest the joint for a few more days before resuming regular activities. Watch for fever, or increased swelling or persistent pain in the joint. Call or return to clinic prn if such symptoms occur or there is failure to improve as anticipated.   Assessment/Plan:  Acute pain of left knee  -Discussed possible causes including OA, gout flare, cartilage derangement, bursitis.  Also consider septic joint. -We will obtain imaging -Drainage of L knee attempted.  Minimal fluid withdrawn.  Pt tolerated  procedure well. - Plan: DG Knee Complete 4 Views Left  F/u as needed  Grier Mitts, MD

## 2020-10-10 ENCOUNTER — Encounter: Payer: Self-pay | Admitting: Family Medicine

## 2020-10-10 ENCOUNTER — Other Ambulatory Visit: Payer: Self-pay

## 2020-10-10 ENCOUNTER — Ambulatory Visit (INDEPENDENT_AMBULATORY_CARE_PROVIDER_SITE_OTHER): Payer: Medicare Other | Admitting: Family Medicine

## 2020-10-10 VITALS — BP 130/76 | HR 62 | Temp 98.0°F | Wt 178.0 lb

## 2020-10-10 DIAGNOSIS — M1712 Unilateral primary osteoarthritis, left knee: Secondary | ICD-10-CM

## 2020-10-10 MED ORDER — PREDNISONE 10 MG PO TABS: ORAL_TABLET | ORAL | 0 refills | Status: DC

## 2020-10-10 NOTE — Progress Notes (Signed)
Subjective:    Patient ID: Scott [name], male    DOB: 1946-03-13, 74 y.o.   MRN: [id number]  No chief complaint on file.   HPI Patient was seen today for f/u.  X-ray from 10/03/2020 with moderate tricompartmental OA. Pt endorses continued L knee pain causing difficulty walking.  Pt trying voltaren gel x 2-3 days.  Denies edema, erythema, or increased warmth.  Pain in middle of L lateral knee going down into lower leg and foot.  Pt trying to move around less, but it is hard as pt is very active and cares for his grandkids.  Past Medical History:  Diagnosis Date  . ANEMIA, OTHER UNSPEC 08/02/2009  . B12 DEFICIENCY 07/07/2007  . Bell's palsy     around 2005  . BPPV (benign paroxysmal positional vertigo)    2003  . Cataract   . GERD 07/13/2007  . Gout, unspecified 06/17/2010  . HYPERLIPIDEMIA 07/13/2007  . HYPERTENSION 07/13/2007  . RENAL INSUFFICIENCY 06/04/2010    Allergies  Allergen Reactions  . Nsaids     REACTION: hx of renal insufficiency  . Statins     myalgias    ROS General: Denies fever, chills, night sweats, changes in weight, changes in appetite HEENT: Denies headaches, ear pain, changes in vision, rhinorrhea, sore throat CV: Denies CP, palpitations, SOB, orthopnea Pulm: Denies SOB, cough, wheezing GI: Denies abdominal pain, nausea, vomiting, diarrhea, constipation GU: Denies dysuria, hematuria, frequency Msk: Denies muscle cramps  + left knee pain Neuro: Denies weakness, numbness, tingling Skin: Denies rashes, bruising Psych: Denies depression, anxiety, hallucinations      Objective:    Blood pressure 130/76, pulse 62, temperature 98 F (36.7 C), temperature source Oral, weight 178 lb (80.7 kg), SpO2 97 %.   Gen. Pleasant, well-nourished, in no distress, normal affect  HEENT: [place]/AT, face symmetric, conjunctiva clear, no scleral icterus, PERRLA, EOMI, nares patent without drainage, pharynx without erythema or exudate. Neck: No JVD, no thyromegaly, no carotid  bruits Lungs: no accessory muscle use, CTAB, no wheezes or rales Cardiovascular: RRR, no m/r/g, no peripheral edema Musculoskeletal: Left knee> right knee, TTP of lateral joint line.  No increased warmth erythema, or effusion.  No cyanosis or clubbing, normal tone Neuro:  A&Ox3, CN II-XII intact, ambulating with a limp 2/2 left knee pain Skin:  Warm, no lesions/ rash   Wt Readings from Last 3 Encounters:  10/10/20 178 lb (80.7 kg)  10/03/20 177 lb 9.6 oz (80.6 kg)  07/11/20 181 lb (82.1 kg)    Lab Results  Component Value Date   WBC 5.3 07/19/2020   HGB 12.1 (L) 07/19/2020   HCT 37.3 (L) 07/19/2020   PLT 176 07/19/2020   GLUCOSE 100 (H) 07/19/2020   CHOL 191 07/19/2020   TRIG 70 07/19/2020   HDL 48 07/19/2020   LDLDIRECT 131.0 01/24/2016   LDLCALC 127 (H) 07/19/2020   ALT 14 04/24/2020   AST 24 04/24/2020   NA 135 07/19/2020   K 4.4 07/19/2020   CL 100 07/19/2020   CREATININE 1.66 (H) 07/19/2020   BUN 20 07/19/2020   CO2 28 07/19/2020   TSH 1.93 10/10/2014   PSA 3.52 03/30/2017   HGBA1C 6.2 06/15/2018    Assessment/Plan:  Arthritis of left knee -Left knee x-ray from 10/03/2020 with tricompartmental OA -Discussed various treatment options including joint injection versus p.o. prednisone.  Patient opts for p.o. prednisone. -Discussed referral to Ortho.  Patient declines at this time. -Given handout -Continue supportive care including rest, Tylenol arthritis  strength, tramadol, Voltaren gel 4 times daily -Continue to monitor - Plan: predniSONE (DELTASONE) 10 MG tablet  F/u as needed  Grier Mitts, MD

## 2020-10-10 NOTE — Patient Instructions (Signed)

## 2020-10-16 ENCOUNTER — Encounter: Payer: Self-pay | Admitting: Family Medicine

## 2020-10-17 ENCOUNTER — Ambulatory Visit (INDEPENDENT_AMBULATORY_CARE_PROVIDER_SITE_OTHER): Payer: Medicare Other | Admitting: Family Medicine

## 2020-10-17 ENCOUNTER — Other Ambulatory Visit: Payer: Self-pay

## 2020-10-17 ENCOUNTER — Encounter: Payer: Self-pay | Admitting: Family Medicine

## 2020-10-17 VITALS — BP 118/58 | HR 81 | Temp 98.6°F | Wt 176.4 lb

## 2020-10-17 DIAGNOSIS — M1712 Unilateral primary osteoarthritis, left knee: Secondary | ICD-10-CM | POA: Diagnosis not present

## 2020-10-17 NOTE — Progress Notes (Signed)
Subjective:    Patient ID: Scott [name], male    DOB: 1945/11/04, 74 y.o.   MRN: [id number]  No chief complaint on file.   HPI Patient was seen today for continued left knee pain.  P.o. prednisone helped some.  X-ray left knee from 10/03/2020 with moderate OA.  Past Medical History:  Diagnosis Date  . ANEMIA, OTHER UNSPEC 08/02/2009  . B12 DEFICIENCY 07/07/2007  . Bell's palsy     around 2005  . BPPV (benign paroxysmal positional vertigo)    2003  . Cataract   . GERD 07/13/2007  . Gout, unspecified 06/17/2010  . HYPERLIPIDEMIA 07/13/2007  . HYPERTENSION 07/13/2007  . RENAL INSUFFICIENCY 06/04/2010    Allergies  Allergen Reactions  . Nsaids     REACTION: hx of renal insufficiency  . Statins     myalgias    ROS General: Denies fever, chills, night sweats, changes in weight, changes in appetite HEENT: Denies headaches, ear pain, changes in vision, rhinorrhea, sore throat CV: Denies CP, palpitations, SOB, orthopnea Pulm: Denies SOB, cough, wheezing GI: Denies abdominal pain, nausea, vomiting, diarrhea, constipation GU: Denies dysuria, hematuria, frequency Msk: Denies muscle cramps, joint pains  +L knee pain Neuro: Denies weakness, numbness, tingling Skin: Denies rashes, bruising Psych: Denies depression, anxiety, hallucinations      Objective:    Blood pressure (!) 118/58, pulse 81, temperature 98.6 F (37 C), temperature source Oral, weight 176 lb 6.4 oz (80 kg), SpO2 98 %.  Gen. Pleasant, well-nourished, in no distress, normal affect  HEENT: [place]/AT, face symmetric, conjunctiva clear, no scleral icterus, PERRLA, EOMI, nares patent without drainage Lungs: no accessory muscle use Cardiovascular: RRR, no peripheral edema Abdomen: BS present, soft, NT/ND, no hepatosplenomegaly. Musculoskeletal: L knee> R knee.  No effusion.  TTP of lateral joint line, L knee.  No crepitus. No deformities, no cyanosis or clubbing, normal tone Neuro:  A&Ox3, CN II-XII intact, normal  gait   Wt Readings from Last 3 Encounters:  10/17/20 176 lb 6.4 oz (80 kg)  10/10/20 178 lb (80.7 kg)  10/03/20 177 lb 9.6 oz (80.6 kg)    Lab Results  Component Value Date   WBC 5.3 07/19/2020   HGB 12.1 (L) 07/19/2020   HCT 37.3 (L) 07/19/2020   PLT 176 07/19/2020   GLUCOSE 100 (H) 07/19/2020   CHOL 191 07/19/2020   TRIG 70 07/19/2020   HDL 48 07/19/2020   LDLDIRECT 131.0 01/24/2016   LDLCALC 127 (H) 07/19/2020   ALT 14 04/24/2020   AST 24 04/24/2020   NA 135 07/19/2020   K 4.4 07/19/2020   CL 100 07/19/2020   CREATININE 1.66 (H) 07/19/2020   BUN 20 07/19/2020   CO2 28 07/19/2020   TSH 1.93 10/10/2014   PSA 3.52 03/30/2017   HGBA1C 6.2 06/15/2018   Knee Injection Procedure Note  Pre-operative Diagnosis: left knee OA  Indications: Pain and joint  Anesthesia: Lidocaine 1% without epinephrine without added sodium bicarbonate  Procedure Details   Verbal consent was obtained for the procedure. The joint was prepped with Betadine and a small wheel of anesthetic was injected into the subcutaneous tissue. A 22 gauge needle was inserted into the superior aspect of the joint from a lateral approach. . 3 ml 1% lidocaine and 1 ml of triamcinolone (KENALOG) 40mg /ml was then injected into the joint. The needle was removed and the area cleansed and dressed.  Complications:  None; patient tolerated the procedure well.  Assessment/Plan:  Primary osteoarthritis of left knee -X-ray  from 10/03/2020 with moderate OA in left knee -Consent obtained for steroid joint injection.  Patient tolerated procedure well. -Discussed supportive care -Consider follow-up with Ortho for continued or worsening symptoms -Given handout  F/u prn  Grier Mitts, MD

## 2020-10-17 NOTE — Patient Instructions (Signed)
Knee Injection A knee injection is a procedure to get medicine into your knee joint to relieve the pain, swelling, and stiffness of arthritis. Your health care provider uses a needle to inject medicine, which may also help to lubricate and cushion your knee joint. You may need more than one injection. Tell a health care provider about:  Any allergies you have.  All medicines you are taking, including vitamins, herbs, eye drops, creams, and over-the-counter medicines.  Any problems you or family members have had with anesthetic medicines.  Any blood disorders you have.  Any surgeries you have had.  Any medical conditions you have.  Whether you are pregnant or may be pregnant. What are the risks? Generally, this is a safe procedure. However, problems may occur, including:  Infection.  Bleeding.  Symptoms that get worse.  Damage to the area around your knee.  Allergic reaction to any of the medicines.  Skin reactions from repeated injections. What happens before the procedure?  Ask your health care provider about changing or stopping your regular medicines. This is especially important if you are taking diabetes medicines or blood thinners.  Plan to have someone take you home from the hospital or clinic. What happens during the procedure?   You will sit or lie down in a position for your knee to be treated.  The skin over your kneecap will be cleaned with a germ-killing soap.  You will be given a medicine that numbs the area (local anesthetic). You may feel some stinging.  The medicine will be injected into your knee. The needle is carefully placed between your kneecap and your knee. The medicine is injected into the joint space.  The needle will be removed at the end of the procedure.  A bandage (dressing) may be placed over the injection site. The procedure may vary among health care providers and hospitals. What can I expect after the procedure?  Your blood  pressure, heart rate, breathing rate, and blood oxygen level will be monitored until you leave the hospital or clinic.  You may have to move your knee through its full range of motion. This helps to get all the medicine into your joint space.  You will be watched to make sure that you do not have a reaction to the injected medicine.  You may feel more pain, swelling, and warmth than you did before the injection. This reaction may last about 1-2 days. Follow these instructions at home: Medicines  Take over-the-counter and prescription medicines only as told by your doctor.  Do not drive or use heavy machinery while taking prescription pain medicine.  Do not take medicines such as aspirin and ibuprofen unless your health care provider tells you to take them. Injection site care  Follow instructions from your health care provider about: ? How to take care of your puncture site. ? When and how you should change your dressing. ? When you should remove your dressing.  Check your injection area every day for signs of infection. Check for: ? More redness, swelling, or pain after 2 days. ? Fluid or blood. ? Pus or a bad smell. ? Warmth. Managing pain, stiffness, and swelling   If directed, put ice on the injection area: ? Put ice in a plastic bag. ? Place a towel between your skin and the bag. ? Leave the ice on for 20 minutes, 2-3 times per day.  Do not apply heat to your knee.  Raise (elevate) the injection area above the level   of your heart while you are sitting or lying down. General instructions  If you were given a dressing, keep it dry until your health care provider says it can be removed. Ask your health care provider when you can start showering or taking a bath.  Avoid strenuous activities for as long as directed by your health care provider. Ask your health care provider when you can return to your normal activities.  Keep all follow-up visits as told by your health  care provider. This is important. You may need more injections. Contact a health care provider if you have:  A fever.  Warmth in your injection area.  Fluid, blood, or pus coming from your injection site.  Symptoms at your injection site that last longer than 2 days after your procedure. Get help right away if:  Your knee: ? Turns very red. ? Becomes very swollen. ? Is in severe pain. Summary  A knee injection is a procedure to get medicine into your knee joint to relieve the pain, swelling, and stiffness of arthritis.  A needle is carefully placed between your kneecap and your knee to inject medicine into the joint space.  Before the procedure, ask your health care provider about changing or stopping your regular medicines, especially if you are taking diabetes medicines or blood thinners.  Contact your health care provider if you have any problems or questions after your procedure. This information is not intended to replace advice given to you by your health care provider. Make sure you discuss any questions you have with your health care provider. Document Revised: 11/08/2017 Document Reviewed: 11/08/2017 Elsevier Patient Education  [place].  Knee Injection A knee injection is a procedure to get medicine into your knee joint to relieve the pain, swelling, and stiffness of arthritis. Your health care provider uses a needle to inject medicine, which may also help to lubricate and cushion your knee joint. You may need more than one injection. Tell a health care provider about:  Any allergies you have.  All medicines you are taking, including vitamins, herbs, eye drops, creams, and over-the-counter medicines.  Any problems you or family members have had with anesthetic medicines.  Any blood disorders you have.  Any surgeries you have had.  Any medical conditions you have.  Whether you are pregnant or may be pregnant. What are the risks? Generally, this is a  safe procedure. However, problems may occur, including:  Infection.  Bleeding.  Symptoms that get worse.  Damage to the area around your knee.  Allergic reaction to any of the medicines.  Skin reactions from repeated injections. What happens before the procedure?  Ask your health care provider about changing or stopping your regular medicines. This is especially important if you are taking diabetes medicines or blood thinners.  Plan to have someone take you home from the hospital or clinic. What happens during the procedure?   You will sit or lie down in a position for your knee to be treated.  The skin over your kneecap will be cleaned with a germ-killing soap.  You will be given a medicine that numbs the area (local anesthetic). You may feel some stinging.  The medicine will be injected into your knee. The needle is carefully placed between your kneecap and your knee. The medicine is injected into the joint space.  The needle will be removed at the end of the procedure.  A bandage (dressing) may be placed over the injection site. The procedure may  vary among health care providers and hospitals. What can I expect after the procedure?  Your blood pressure, heart rate, breathing rate, and blood oxygen level will be monitored until you leave the hospital or clinic.  You may have to move your knee through its full range of motion. This helps to get all the medicine into your joint space.  You will be watched to make sure that you do not have a reaction to the injected medicine.  You may feel more pain, swelling, and warmth than you did before the injection. This reaction may last about 1-2 days. Follow these instructions at home: Medicines  Take over-the-counter and prescription medicines only as told by your doctor.  Do not drive or use heavy machinery while taking prescription pain medicine.  Do not take medicines such as aspirin and ibuprofen unless your health care  provider tells you to take them. Injection site care  Follow instructions from your health care provider about: ? How to take care of your puncture site. ? When and how you should change your dressing. ? When you should remove your dressing.  Check your injection area every day for signs of infection. Check for: ? More redness, swelling, or pain after 2 days. ? Fluid or blood. ? Pus or a bad smell. ? Warmth. Managing pain, stiffness, and swelling   If directed, put ice on the injection area: ? Put ice in a plastic bag. ? Place a towel between your skin and the bag. ? Leave the ice on for 20 minutes, 2-3 times per day.  Do not apply heat to your knee.  Raise (elevate) the injection area above the level of your heart while you are sitting or lying down. General instructions  If you were given a dressing, keep it dry until your health care provider says it can be removed. Ask your health care provider when you can start showering or taking a bath.  Avoid strenuous activities for as long as directed by your health care provider. Ask your health care provider when you can return to your normal activities.  Keep all follow-up visits as told by your health care provider. This is important. You may need more injections. Contact a health care provider if you have:  A fever.  Warmth in your injection area.  Fluid, blood, or pus coming from your injection site.  Symptoms at your injection site that last longer than 2 days after your procedure. Get help right away if:  Your knee: ? Turns very red. ? Becomes very swollen. ? Is in severe pain. Summary  A knee injection is a procedure to get medicine into your knee joint to relieve the pain, swelling, and stiffness of arthritis.  A needle is carefully placed between your kneecap and your knee to inject medicine into the joint space.  Before the procedure, ask your health care provider about changing or stopping your regular  medicines, especially if you are taking diabetes medicines or blood thinners.  Contact your health care provider if you have any problems or questions after your procedure. This information is not intended to replace advice given to you by your health care provider. Make sure you discuss any questions you have with your health care provider. Document Revised: 11/08/2017 Document Reviewed: 11/08/2017 Elsevier Patient Education  [place].  Knee Injection A knee injection is a procedure to get medicine into your knee joint to relieve the pain, swelling, and stiffness of arthritis. Your health care provider uses  a needle to inject medicine, which may also help to lubricate and cushion your knee joint. You may need more than one injection. Tell a health care provider about:  Any allergies you have.  All medicines you are taking, including vitamins, herbs, eye drops, creams, and over-the-counter medicines.  Any problems you or family members have had with anesthetic medicines.  Any blood disorders you have.  Any surgeries you have had.  Any medical conditions you have.  Whether you are pregnant or may be pregnant. What are the risks? Generally, this is a safe procedure. However, problems may occur, including:  Infection.  Bleeding.  Symptoms that get worse.  Damage to the area around your knee.  Allergic reaction to any of the medicines.  Skin reactions from repeated injections. What happens before the procedure?  Ask your health care provider about changing or stopping your regular medicines. This is especially important if you are taking diabetes medicines or blood thinners.  Plan to have someone take you home from the hospital or clinic. What happens during the procedure?   You will sit or lie down in a position for your knee to be treated.  The skin over your kneecap will be cleaned with a germ-killing soap.  You will be given a medicine that numbs the area  (local anesthetic). You may feel some stinging.  The medicine will be injected into your knee. The needle is carefully placed between your kneecap and your knee. The medicine is injected into the joint space.  The needle will be removed at the end of the procedure.  A bandage (dressing) may be placed over the injection site. The procedure may vary among health care providers and hospitals. What can I expect after the procedure?  Your blood pressure, heart rate, breathing rate, and blood oxygen level will be monitored until you leave the hospital or clinic.  You may have to move your knee through its full range of motion. This helps to get all the medicine into your joint space.  You will be watched to make sure that you do not have a reaction to the injected medicine.  You may feel more pain, swelling, and warmth than you did before the injection. This reaction may last about 1-2 days. Follow these instructions at home: Medicines  Take over-the-counter and prescription medicines only as told by your doctor.  Do not drive or use heavy machinery while taking prescription pain medicine.  Do not take medicines such as aspirin and ibuprofen unless your health care provider tells you to take them. Injection site care  Follow instructions from your health care provider about: ? How to take care of your puncture site. ? When and how you should change your dressing. ? When you should remove your dressing.  Check your injection area every day for signs of infection. Check for: ? More redness, swelling, or pain after 2 days. ? Fluid or blood. ? Pus or a bad smell. ? Warmth. Managing pain, stiffness, and swelling   If directed, put ice on the injection area: ? Put ice in a plastic bag. ? Place a towel between your skin and the bag. ? Leave the ice on for 20 minutes, 2-3 times per day.  Do not apply heat to your knee.  Raise (elevate) the injection area above the level of your heart  while you are sitting or lying down. General instructions  If you were given a dressing, keep it dry until your health care provider says  it can be removed. Ask your health care provider when you can start showering or taking a bath.  Avoid strenuous activities for as long as directed by your health care provider. Ask your health care provider when you can return to your normal activities.  Keep all follow-up visits as told by your health care provider. This is important. You may need more injections. Contact a health care provider if you have:  A fever.  Warmth in your injection area.  Fluid, blood, or pus coming from your injection site.  Symptoms at your injection site that last longer than 2 days after your procedure. Get help right away if:  Your knee: ? Turns very red. ? Becomes very swollen. ? Is in severe pain. Summary  A knee injection is a procedure to get medicine into your knee joint to relieve the pain, swelling, and stiffness of arthritis.  A needle is carefully placed between your kneecap and your knee to inject medicine into the joint space.  Before the procedure, ask your health care provider about changing or stopping your regular medicines, especially if you are taking diabetes medicines or blood thinners.  Contact your health care provider if you have any problems or questions after your procedure. This information is not intended to replace advice given to you by your health care provider. Make sure you discuss any questions you have with your health care provider. Document Revised: 11/08/2017 Document Reviewed: 11/08/2017 Elsevier Patient Education  2020 [place].  Osteoarthritis  Osteoarthritis is a type of arthritis that affects tissue that covers the ends of bones in joints (cartilage). Cartilage acts as a cushion between the bones and helps them move smoothly. Osteoarthritis results when cartilage in the joints gets worn down. Osteoarthritis is  sometimes called "wear and tear" arthritis. Osteoarthritis is the most common form of arthritis. It often occurs in older people. It is a condition that gets worse over time (a progressive condition). Joints that are most often affected by this condition are in:  Fingers.  Toes.  Hips.  Knees.  Spine, including neck and lower back. What are the causes? This condition is caused by age-related wearing down of cartilage that covers the ends of bones. What increases the risk? The following factors may make you more likely to develop this condition:  Older age.  Being overweight or obese.  Overuse of joints, such as in athletes.  Past injury of a joint.  Past surgery on a joint.  Family history of osteoarthritis. What are the signs or symptoms? The main symptoms of this condition are pain, swelling, and stiffness in the joint. The joint may lose its shape over time. Small pieces of bone or cartilage may break off and float inside of the joint, which may cause more pain and damage to the joint. Small deposits of bone (osteophytes) may grow on the edges of the joint. Other symptoms may include:  A grating or scraping feeling inside the joint when you move it.  Popping or creaking sounds when you move. Symptoms may affect one or more joints. Osteoarthritis in a major joint, such as your knee or hip, can make it painful to walk or exercise. If you have osteoarthritis in your hands, you might not be able to grip items, twist your hand, or control small movements of your hands and fingers (fine motor skills). How is this diagnosed? This condition may be diagnosed based on:  Your medical history.  A physical exam.  Your symptoms.  X-rays  of the affected joint(s).  Blood tests to rule out other types of arthritis. How is this treated? There is no cure for this condition, but treatment can help to control pain and improve joint function. Treatment plans may include:  A prescribed  exercise program that allows for rest and joint relief. You may work with a physical therapist.  A weight control plan.  Pain relief techniques, such as: ? Applying heat and cold to the joint. ? Electric pulses delivered to nerve endings under the skin (transcutaneous electrical nerve stimulation, or TENS). ? Massage. ? Certain nutritional supplements.  NSAIDs or prescription medicines to help relieve pain.  Medicine to help relieve pain and inflammation (corticosteroids). This can be given by mouth (orally) or as an injection.  Assistive devices, such as a brace, wrap, splint, specialized glove, or cane.  Surgery, such as: ? An osteotomy. This is done to reposition the bones and relieve pain or to remove loose pieces of bone and cartilage. ? Joint replacement surgery. You may need this surgery if you have very bad (advanced) osteoarthritis. Follow these instructions at home: Activity  Rest your affected joints as directed by your health care provider.  Do not drive or use heavy machinery while taking prescription pain medicine.  Exercise as directed. Your health care provider or physical therapist may recommend specific types of exercise, such as: ? Strengthening exercises. These are done to strengthen the muscles that support joints that are affected by arthritis. They can be performed with weights or with exercise bands to add resistance. ? Aerobic activities. These are exercises, such as brisk walking or water aerobics, that get your heart pumping. ? Range-of-motion activities. These keep your joints easy to move. ? Balance and agility exercises. Managing pain, stiffness, and swelling      If directed, apply heat to the affected area as often as told by your health care provider. Use the heat source that your health care provider recommends, such as a moist heat pack or a heating pad. ? If you have a removable assistive device, remove it as told by your health care  provider. ? Place a towel between your skin and the heat source. If your health care provider tells you to keep the assistive device on while you apply heat, place a towel between the assistive device and the heat source. ? Leave the heat on for 20-30 minutes. ? Remove the heat if your skin turns bright red. This is especially important if you are unable to feel pain, heat, or cold. You may have a greater risk of getting burned.  If directed, put ice on the affected joint: ? If you have a removable assistive device, remove it as told by your health care provider. ? Put ice in a plastic bag. ? Place a towel between your skin and the bag. If your health care provider tells you to keep the assistive device on during icing, place a towel between the assistive device and the bag. ? Leave the ice on for 20 minutes, 2-3 times a day. General instructions  Take over-the-counter and prescription medicines only as told by your health care provider.  Maintain a healthy weight. Follow instructions from your health care provider for weight control. These may include dietary restrictions.  Do not use any products that contain nicotine or tobacco, such as cigarettes and e-cigarettes. These can delay bone healing. If you need help quitting, ask your health care provider.  Use assistive devices as directed by your health  care provider.  Keep all follow-up visits as told by your health care provider. This is important. Where to find more information  [employer] of Arthritis and Musculoskeletal and Skin Diseases: www.niams.[employer].es  [employer] on Aging: http://kim-miller.com/  American College of Rheumatology: www.rheumatology.org Contact a health care provider if:  Your skin turns red.  You develop a rash.  You have pain that gets worse.  You have a fever along with joint or muscle aches. Get help right away if:  You lose a lot of weight.  You suddenly lose your appetite.  You have  night sweats. Summary  Osteoarthritis is a type of arthritis that affects tissue covering the ends of bones in joints (cartilage).  This condition is caused by age-related wearing down of cartilage that covers the ends of bones.  The main symptom of this condition is pain, swelling, and stiffness in the joint.  There is no cure for this condition, but treatment can help to control pain and improve joint function. This information is not intended to replace advice given to you by your health care provider. Make sure you discuss any questions you have with your health care provider. Document Revised: 10/01/2017 Document Reviewed: 06/22/2016 Elsevier Patient Education  2020 [employer].

## 2020-10-29 ENCOUNTER — Telehealth: Payer: Self-pay | Admitting: Family Medicine

## 2020-10-29 NOTE — Telephone Encounter (Signed)
Patient is requesting Dr. Salomon Fick nurse to give him a call.  Call back on his cell phone number.

## 2020-11-04 ENCOUNTER — Other Ambulatory Visit: Payer: Self-pay | Admitting: Family Medicine

## 2020-11-04 NOTE — Telephone Encounter (Signed)
Pt LOV was on 10/17/2020 and last refill was done on 11/28/2019 for 60 with 5 refills, advise if ok to send refill

## 2020-11-05 DIAGNOSIS — M1712 Unilateral primary osteoarthritis, left knee: Secondary | ICD-10-CM | POA: Diagnosis not present

## 2020-11-11 ENCOUNTER — Other Ambulatory Visit: Payer: Self-pay | Admitting: Family Medicine

## 2020-11-11 DIAGNOSIS — M25562 Pain in left knee: Secondary | ICD-10-CM | POA: Diagnosis not present

## 2020-11-11 DIAGNOSIS — M1712 Unilateral primary osteoarthritis, left knee: Secondary | ICD-10-CM | POA: Diagnosis not present

## 2020-11-11 NOTE — Telephone Encounter (Signed)
Pt  LOV was on 10/17/2020 and last refill was done on 08/30/2020 for 120 tablets with 1 refill. Please advise

## 2020-11-12 DIAGNOSIS — M1712 Unilateral primary osteoarthritis, left knee: Secondary | ICD-10-CM | POA: Diagnosis not present

## 2020-11-15 ENCOUNTER — Ambulatory Visit: Payer: Self-pay | Admitting: Student

## 2020-11-25 ENCOUNTER — Telehealth: Payer: Self-pay | Admitting: Family Medicine

## 2020-11-25 NOTE — Telephone Encounter (Signed)
Patient is returning a call from [place].  Please advise.

## 2020-11-27 NOTE — Telephone Encounter (Signed)
Spoke with pt scheduled appointment 12/02/2020 for surgical clearance per Dr Volanda Napoleon

## 2020-12-02 ENCOUNTER — Other Ambulatory Visit: Payer: Medicare Other

## 2020-12-02 ENCOUNTER — Ambulatory Visit (INDEPENDENT_AMBULATORY_CARE_PROVIDER_SITE_OTHER): Payer: Medicare Other | Admitting: Family Medicine

## 2020-12-02 ENCOUNTER — Encounter: Payer: Self-pay | Admitting: Family Medicine

## 2020-12-02 ENCOUNTER — Other Ambulatory Visit: Payer: Self-pay

## 2020-12-02 VITALS — BP 110/78 | HR 71 | Temp 98.6°F | Ht 70.0 in | Wt 165.6 lb

## 2020-12-02 DIAGNOSIS — I10 Essential (primary) hypertension: Secondary | ICD-10-CM

## 2020-12-02 DIAGNOSIS — Z01818 Encounter for other preprocedural examination: Secondary | ICD-10-CM | POA: Diagnosis not present

## 2020-12-02 DIAGNOSIS — N1831 Chronic kidney disease, stage 3a: Secondary | ICD-10-CM | POA: Diagnosis not present

## 2020-12-02 DIAGNOSIS — M1712 Unilateral primary osteoarthritis, left knee: Secondary | ICD-10-CM

## 2020-12-02 DIAGNOSIS — R9431 Abnormal electrocardiogram [ECG] [EKG]: Secondary | ICD-10-CM | POA: Diagnosis not present

## 2020-12-02 LAB — POCT URINALYSIS DIPSTICK
Bilirubin, UA: NEGATIVE
Blood, UA: NEGATIVE
Glucose, UA: NEGATIVE
Ketones, UA: NEGATIVE
Leukocytes, UA: NEGATIVE
Nitrite, UA: NEGATIVE
Protein, UA: POSITIVE — AB
Spec Grav, UA: 1.015 (ref 1.010–1.025)
Urobilinogen, UA: 0.2 E.U./dL
pH, UA: 7 (ref 5.0–8.0)

## 2020-12-02 LAB — BASIC METABOLIC PANEL
BUN: 18 mg/dL (ref 6–23)
CO2: 31 mEq/L (ref 19–32)
Calcium: 9.4 mg/dL (ref 8.4–10.5)
Chloride: 93 mEq/L — ABNORMAL LOW (ref 96–112)
Creatinine, Ser: 1.26 mg/dL (ref 0.40–1.50)
GFR: 55.23 mL/min — ABNORMAL LOW (ref >60.00)
Glucose, Bld: 75 mg/dL (ref 70–99)
Potassium: 3.9 mEq/L (ref 3.5–5.1)
Sodium: 128 mEq/L — ABNORMAL LOW (ref 135–145)

## 2020-12-02 LAB — ALBUMIN: Albumin: 3.9 g/dL (ref 3.5–5.2)

## 2020-12-02 LAB — TROPONIN I (HIGH SENSITIVITY): High Sens Troponin I: 8 ng/L (ref 2–17)

## 2020-12-02 NOTE — Progress Notes (Signed)
No chief complaint on file.   HPI:  Patient is seen for optimization of general medical care prior to surgery. Surgery type: Left TKR Date of surgery: 12/18/2020  Kidney disease? No Prior surgeries/Issues following anesthesia?  Bilateral carpal tunnel release.  None Hx MI, heart arrythmia, CHF, angina or stroke? none Epilepsy or Seizures? none Arthritis or problems with neck or jaw? none Thyroid disease? none Liver disease? none Asthma, COPD or chronic lung disease? none Diabetes? none  Other: Poor nutrition, Frail or other: no  METS:  ?Can take care of self, such as eat, dress, or use the toilet (1 MET). yes ?Can walk up a flight of steps or a hill (4 METs).yes ?Can do heavy work around the house such as scrubbing floors or lifting or moving heavy furniture (between 4 and 10 METs). yes ?Can participate in strenuous sports such as swimming, singles tennis, football, basketball, and skiing (>10 METs) no . AHA Risks: Major predictors that require intensive management and may lead to delay in or cancellation of the operative procedure unless emergent: NONE  . Unstable coronary syndromes including unstable or severe angina or recent MI  . Decompensated heart failure including NYHA functional class IV or worsening or new-onset HF  . Significant arrhythmias including high grade AV block, symptomatic ventricular arrhythmias, supraventricular arrhythmias with ventricular rate >100 bpm at rest, symptomatic bradycardia, and newly recognized ventricular tachycardia  . Severe heart valve disease including severe aortic stenosis or symptomatic mitral stenosis   Other clinical predictors that warrant careful assessment of current status:   . History of ischemic heart disease . History of cerebrovascular disease  . History of compensated heart failure or prior heart failure  . Diabetes mellitus  . Renal insufficiency  Type of surgery and Risk: 1) High risk (reported risk of cardiac death or  nonfatal myocardial infarction [MI] often greater than 5 percent):  Marland Kitchen Aortic and other major vascular surgery  . Peripheral artery surgery   2)Intermediate risk (reported risk of cardiac death or nonfatal MI generally 1 to 5 percent):  Marland Kitchen Carotid endarterectomy  . Head and neck surgery  . Intraperitoneal and intrathoracic surgery  . Orthopedic surgery  . Prostate surgery   3)Low risk (reported risk of cardiac death or nonfatal MI generally less than 1 percent):  Marland Kitchen Ambulatory surgery  . Endoscopic procedures  . Superficial procedure  . Cataract surgery  . Breast surgery  Medications that need to be addressed prior to surgery: None Discontinue acei/arbs/non-statin lipid lowering drugs day of surgery ASA stop 7 days before or discuss with cardiology if CV risks, other anticoagulants discuss with cardiology.   ROS: See pertinent positives and negatives per HPI. 11 point ROS negative except where noted.  Past Medical History:  Diagnosis Date  . ANEMIA, OTHER UNSPEC 08/02/2009  . B12 DEFICIENCY 07/07/2007  . Bell's palsy     around 2005  . BPPV (benign paroxysmal positional vertigo)    2003  . Cataract   . GERD 07/13/2007  . Gout, unspecified 06/17/2010  . HYPERLIPIDEMIA 07/13/2007  . HYPERTENSION 07/13/2007  . RENAL INSUFFICIENCY 06/04/2010    Past Surgical History:  Procedure Laterality Date  . BACK SURGERY  07/13/07  . CARPAL TUNNEL RELEASE Bilateral   . CATARACT EXTRACTION, BILATERAL Bilateral     Family History  Problem Relation Age of Onset  . Arthritis Father     Social History   Socioeconomic History  . Marital status: Married    Spouse name: MaryAnn  . Number  of children: 4  . Years of education: Not on file  . Highest education level: Some college, no degree  Occupational History    Employer: RETIRED  Tobacco Use  . Smoking status: Never Smoker  . Smokeless tobacco: Never Used  Vaping Use  . Vaping Use: Never used  Substance and Sexual Activity  . Alcohol  use: No  . Drug use: No  . Sexual activity: Not on file  Other Topics Concern  . Not on file  Social History Narrative   Married 1965. 4 children. 11 grandchildren. 1 greatgrandchild on way in 2015.       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 He is married, lives with his wife. He states he stops every morning and gets a 52oz tea. He walks most days.   Social Determinants of Health   Financial Resource Strain: Low Risk   . Difficulty of Paying Living Expenses: Not hard at all  Food Insecurity: No Food Insecurity  . Worried About Charity fundraiser in the Last Year: Never true  . Ran Out of Food in the Last Year: Never true  Transportation Needs: No Transportation Needs  . Lack of Transportation (Medical): No  . Lack of Transportation (Non-Medical): No  Physical Activity: Inactive  . Days of Exercise per Week: 0 days  . Minutes of Exercise per Session: 0 min  Stress: No Stress Concern Present  . Feeling of Stress : Not at all  Social Connections: Moderately Isolated  . Frequency of Communication with Friends and Family: Twice a week  . Frequency of Social Gatherings with Friends and Family: Twice a week  . Attends Religious Services: Never  . Active Member of Clubs or Organizations: No  . Attends Archivist Meetings: Never  . Marital Status: Married     Current Outpatient Medications:  .  allopurinol (ZYLOPRIM) 300 MG tablet, TAKE 1 TABLET BY MOUTH EVERY DAY, Disp: 90 tablet, Rfl: 3 .  atorvastatin (LIPITOR) 20 MG tablet, Take 1 tablet (20 mg total) by mouth once a week., Disp: 13 tablet, Rfl: 3 .  celecoxib (CELEBREX) 100 MG capsule, TAKE 1 CAPSULE (100 MG TOTAL) BY MOUTH 2 (TWO) TIMES DAILY AS NEEDED FOR MODERATE PAIN (SHOULDER)., Disp: 60 capsule, Rfl: 5 .  colchicine 0.6 MG tablet, TAKE 1 TABLET BY MOUTH EVERY DAY, Disp: 30 tablet, Rfl: 1 .  Cyanocobalamin 1000 MCG SUBL, Place 1 tablet (1,000 mcg total) under the  tongue once a week., Disp: 4 each, Rfl: 4 .  fluticasone (FLONASE) 50 MCG/ACT nasal spray, SPRAY 2 SPRAYS INTO EACH NOSTRIL EVERY DAY, Disp: 48 mL, Rfl: 1 .  losartan (COZAAR) 50 MG tablet, TAKE 1 TABLET BY MOUTH EVERY DAY, Disp: 90 tablet, Rfl: 1 .  Polyethylene Glycol 3350 POWD, Take 17 g by mouth daily., Disp: , Rfl:  .  RABEprazole (ACIPHEX) 20 MG tablet, Take 20 mg by mouth daily., Disp: , Rfl:  .  sildenafil (VIAGRA) 50 MG tablet, Take one half tab (25 mg) daily as needed 1 hour prior to sexual activity., Disp: 10 tablet, Rfl: 0 .  tiZANidine (ZANAFLEX) 4 MG tablet, TAKE 1/2 TO 1 TABLET AT BEDTIME, Disp: 30 tablet, Rfl: 5 .  traMADol (ULTRAM) 50 MG tablet, TAKE 2 TABLETS BY MOUTH (TWO) TIMES DAILY., Disp: 120 tablet, Rfl: 1 .  triamterene-hydrochlorothiazide (MAXZIDE-25) 37.5-25 MG tablet, TAKE 1 TABLET BY MOUTH EVERY DAY, Disp: 90 tablet,  Rfl: 3  EXAM:  Vitals:   12/02/20 1531  BP: 110/78  Pulse: 71  Temp: 98.6 F (37 C)  SpO2: 98%    Body mass index is 23.76 kg/m.  GENERAL: vitals reviewed and listed above, alert, oriented, appears well hydrated and in no acute distress  HEENT: atraumatic, conjunttiva clear, no obvious abnormalities on inspection of external nose and ears  NECK: no obvious masses on inspection, no carotid bruits  LUNGS: clear to auscultation bilaterally, no wheezes, rales or rhonchi, good air movement  CV: RRR, no peripheral edema, no JVD, BP normal range, normal radial pulses  MS: Using a crutch on R side to aid with ambulation.  Moves upper extremities and RLE without noticeable abnormality. Brace on L knee.  L knee with edema and decreased ROM.    PSYCH: pleasant and cooperative, no obvious depression or anxiety  ASSESSMENT AND PLAN:  Discussed the following assessment and plan:  Preop examination  -Pt seen for pre-op risk optimization for a L TKR, an intermediate risk procedure.   -EKG obtained.  Sinus rhythm with PR interval 220 ms, ST  elevation in V1, V2, and V3, Q waves noted in V4 and aVL, LVH by criteria.  Concern for incomplete LBBB.  Will send to cardiology for further evaluation. -will obtain labs -form for Ortho to be completed after Cards eval. - Plan: CBC with Differential/Platelet, BMP with eGFR([facility]), Albumin, Hemoglobin A1c, POCT urinalysis dipstick, EKG 12-Lead  Primary osteoarthritis of left knee -Xray L knee on 10/03/20 with moderate tricompartmental OA changes present. -Continue current medications and use of crutch -L TKR planned for 12/19/19 -continue f/u with Ortho  Stage 3a chronic kidney disease ([place]) with  -creatinine 1.66, GFR 46 on 07/19/20 -Avoid nephrotoxic meds -renally dose meds prn -Plan: BMP  Essential hypertension -controlled -continue current medications including losartan 50 mg, Maxzide-25 -lifestyle modifications -Plan: BMP  Abnormal EKG -EKG with sinus rhythm, PR interval 220 msec, ST elevation in V1, V2, and V3, Q waves noted in V4 and aVL, LVH by criteria.  Concern for LBBB. -referral placed to Cardiology.  Pt has an appointment on 12/04/20 at 2 pm with Dr. Tamala Julian [address].  Arrive 15 min prior at 145 pm.  Bring Med list and insurance card.  Update:  BMP with Na+ 128, hypotonic hyponatremia likely thiazide induced.  Will hold triamterene-HCTZ.  Increase losartan from 50 to 100 mg.  Monitor bp.  Recheck Na+ next wk.  For improvement in Na+ will d/c triamterene-hydrochlorothiazide.  For continued hyponatremia check [facility].  Scott [name]

## 2020-12-02 NOTE — Progress Notes (Signed)
Cardiology Office Note:    Date:  12/04/2020   ID:  Scott [name], DOB [birth year], MRN [id number]  PCP:  [name], MD  Cardiologist:  No primary care provider on file.   Referring MD: [name], MD   Chief Complaint  Patient presents with  . Pre-op Exam  . Follow-up    Abnormal EKG    History of Present Illness:    Scott [name] is a 75 y.o. male with a hx of abnormal ECG referred for preoperative cardiology assessment. Dr. Grier Mitts.  Patient is here for preoperative clearance prior to left total knee replacement by Dr. Lyla Glassing.  He was sent for cardiac evaluation because of the appearance of his EKG.  EKG is reviewed.  He does have interventricular conduction delay.  Otherwise no significant abnormality.  The patient has no cardiac complaints.  He specifically denies chest pain, orthopnea, PND, peripheral edema, syncope, palpitations.  He is not a smoker.  He is nondiabetic.  He does have hypertension that is well controlled on Maxide and Cozaar.  He does have elevated lipids due to not being treated.  He does have CKD stage III with creatinine of 1.67.  Past Medical History:  Diagnosis Date  . ANEMIA, OTHER UNSPEC 08/02/2009  . B12 DEFICIENCY 07/07/2007  . Bell's palsy     around 2005  . BPPV (benign paroxysmal positional vertigo)    2003  . Cataract   . GERD 07/13/2007  . Gout, unspecified 06/17/2010  . HYPERLIPIDEMIA 07/13/2007  . HYPERTENSION 07/13/2007  . RENAL INSUFFICIENCY 06/04/2010    Past Surgical History:  Procedure Laterality Date  . BACK SURGERY  07/13/07  . CARPAL TUNNEL RELEASE Bilateral   . CATARACT EXTRACTION, BILATERAL Bilateral     Current Medications: Current Meds  Medication Sig  . allopurinol (ZYLOPRIM) 300 MG tablet TAKE 1 TABLET BY MOUTH EVERY DAY  . celecoxib (CELEBREX) 100 MG capsule TAKE 1 CAPSULE (100 MG TOTAL) BY MOUTH 2 (TWO) TIMES DAILY AS NEEDED FOR MODERATE PAIN (SHOULDER).  . colchicine 0.6 MG tablet TAKE 1 TABLET  BY MOUTH EVERY DAY  . Cyanocobalamin 1000 MCG SUBL Place 1 tablet (1,000 mcg total) under the tongue once a week.  . fluticasone (FLONASE) 50 MCG/ACT nasal spray SPRAY 2 SPRAYS INTO EACH NOSTRIL EVERY DAY  . losartan (COZAAR) 50 MG tablet TAKE 1 TABLET BY MOUTH EVERY DAY  . Polyethylene Glycol 3350 POWD Take 17 g by mouth daily.  . sildenafil (VIAGRA) 50 MG tablet Take one half tab (25 mg) daily as needed 1 hour prior to sexual activity.  . traMADol (ULTRAM) 50 MG tablet TAKE 2 TABLETS BY MOUTH (TWO) TIMES DAILY.  Marland Kitchen triamterene-hydrochlorothiazide (MAXZIDE-25) 37.5-25 MG tablet TAKE 1 TABLET BY MOUTH EVERY DAY     Allergies:   Nsaids and Statins   Social History   Socioeconomic History  . Marital status: Married    Spouse name: MaryAnn  . Number of children: 4  . Years of education: Not on file  . Highest education level: Some college, no degree  Occupational History    Employer: RETIRED  Tobacco Use  . Smoking status: Never Smoker  . Smokeless tobacco: Never Used  Vaping Use  . Vaping Use: Never used  Substance and Sexual Activity  . Alcohol use: No  . Drug use: No  . Sexual activity: Not on file  Other Topics Concern  . Not on file  Social History Narrative   Married 1965. 4 children.  11 grandchildren. 1 greatgrandchild on way in 2015.       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 He is married, lives with his wife. He states he stops every morning and gets a 52oz tea. He walks most days.   Social Determinants of Health   Financial Resource Strain: Low Risk   . Difficulty of Paying Living Expenses: Not hard at all  Food Insecurity: No Food Insecurity  . Worried About Charity fundraiser in the Last Year: Never true  . Ran Out of Food in the Last Year: Never true  Transportation Needs: No Transportation Needs  . Lack of Transportation (Medical): No  . Lack of Transportation (Non-Medical): No  Physical Activity: Inactive   . Days of Exercise per Week: 0 days  . Minutes of Exercise per Session: 0 min  Stress: No Stress Concern Present  . Feeling of Stress : Not at all  Social Connections: Moderately Isolated  . Frequency of Communication with Friends and Family: Twice a week  . Frequency of Social Gatherings with Friends and Family: Twice a week  . Attends Religious Services: Never  . Active Member of Clubs or Organizations: No  . Attends Archivist Meetings: Never  . Marital Status: Married     Family History: The patient's family history includes Arthritis in his father.  ROS:   Please see the history of present illness.    Unable to ambulate very much because of his left knee.  He enjoys being with his grandsons.  He before the knee became so bad was able to walk up and down stairs without difficulty.  There is no family history of vascular disease.  All other systems reviewed and are negative.  EKGs/Labs/Other Studies Reviewed:    The following studies were reviewed today: No cardiac evaluation  EKG:  EKG 12/02/2020, 1 degree AVB, LVH with strain, otherwise okay.  No evidence of left bundle.  No old EKGs are available.  Recent Labs: 04/24/2020: ALT 14 12/02/2020: BUN 18; Creatinine, Ser 1.26; Hemoglobin 11.1; Platelets 232.0; Potassium 3.9; Sodium 128  Recent Lipid Panel    Component Value Date/Time   CHOL 191 07/19/2020 1028   TRIG 70 07/19/2020 1028   TRIG 149 09/28/2006 1017   HDL 48 07/19/2020 1028   CHOLHDL 4.0 07/19/2020 1028   VLDL 22.6 03/30/2017 1103   LDLCALC 127 (H) 07/19/2020 1028   LDLDIRECT 131.0 01/24/2016 0927    Physical Exam:    VS:  BP (!) 112/58   Pulse 78   Ht 5\' 10"  (1.778 m)   Wt 165 lb 12.8 oz (75.2 kg)   SpO2 98%   BMI 23.79 kg/m     Wt Readings from Last 3 Encounters:  12/04/20 165 lb 12.8 oz (75.2 kg)  12/02/20 165 lb 9.6 oz (75.1 kg)  10/17/20 176 lb 6.4 oz (80 kg)     GEN: Slender.. No acute distress HEENT: Normal NECK: No  JVD. LYMPHATICS: No lymphadenopathy CARDIAC: No murmur. RRR no gallop, or edema. VASCULAR:  Normal Pulses. No bruits. RESPIRATORY:  Clear to auscultation without rales, wheezing or rhonchi  ABDOMEN: Soft, non-tender, non-distended, No pulsatile mass, MUSCULOSKELETAL: No deformity  SKIN: Warm and dry NEUROLOGIC:  Alert and oriented x 3 PSYCHIATRIC:  Normal affect   ASSESSMENT:    1. Nonspecific abnormal electrocardiogram (ECG) (EKG)   2. Preop cardiovascular exam   3. Hyperlipidemia, unspecified hyperlipidemia type  4. Essential hypertension   5. Stage 3a chronic kidney disease ([place])   6. Erectile dysfunction, unspecified erectile dysfunction type   7. Educated about COVID-19 virus infection    PLAN:    In order of problems listed above:  1. EKG shows left bundle interventricular conduction delay.  This is possibly related to left ventricular hypertrophy but could simply represent conduction abnormality.  In the absence of symptoms this is not a major concern.  I did recommend a 2D Doppler echocardiogram that is not a prerequisite for his upcoming knee surgery. 2. After examining the patient and reviewing data, he is cleared for upcoming total knee replacement surgery on the right side by Dr. Lyla Glassing.  No specific evaluation is necessary preoperative. 3. Consider treating lipids  4. Excellent blood pressure control.  Continue Cozaar and Maxide as prescribed. 5. Serial follow-up of kidney function. 6. Did not discuss 7. Vaccinated and practicing social distancing. 8.    Medication Adjustments/Labs and Tests Ordered: Current medicines are reviewed at length with the patient today.  Concerns regarding medicines are outlined above.  Orders Placed This Encounter  Procedures  . ECHOCARDIOGRAM COMPLETE   No orders of the defined types were placed in this encounter.   Patient Instructions  Medication Instructions:  Your physician recommends that you continue on your current  medications as directed. Please refer to the Current Medication list given to you today.  *If you need a refill on your cardiac medications before your next appointment, please call your pharmacy*   Lab Work: None If you have labs (blood work) drawn today and your tests are completely normal, you will receive your results only by: Marland Kitchen MyChart Message (if you have MyChart) OR . A paper copy in the mail If you have any lab test that is abnormal or we need to change your treatment, we will call you to review the results.   Testing/Procedures: Your physician has requested that you have an echocardiogram. Echocardiography is a painless test that uses sound waves to create images of your heart. It provides your doctor with information about the size and shape of your heart and how well your heart's chambers and valves are working. This procedure takes approximately one hour. There are no restrictions for this procedure.    Follow-Up: At [facility], you and your health needs are our priority.  As part of our continuing mission to provide you with exceptional heart care, we have created designated Provider Care Teams.  These Care Teams include your primary Cardiologist (physician) and Advanced Practice Providers (APPs -  Physician Assistants and Nurse Practitioners) who all work together to provide you with the care you need, when you need it.  We recommend signing up for the patient portal called "MyChart".  Sign up information is provided on this After Visit Summary.  MyChart is used to connect with patients for Virtual Visits (Telemedicine).  Patients are able to view lab/test results, encounter notes, upcoming appointments, etc.  Non-urgent messages can be sent to your provider as well.   To learn more about what you can do with MyChart, go to [email or web address].    Your next appointment:   As needed  The format for your next appointment:   In Person  Provider:   You may see Dr.  Daneen Schick or one of the following Advanced Practice Providers on your designated Care Team:    Kathyrn Drown, NP    Other Instructions      Signed, Belva Crome  III, MD  12/04/2020 2:48 PM    [facility]

## 2020-12-03 ENCOUNTER — Encounter: Payer: Self-pay | Admitting: Family Medicine

## 2020-12-03 DIAGNOSIS — M1712 Unilateral primary osteoarthritis, left knee: Secondary | ICD-10-CM | POA: Insufficient documentation

## 2020-12-03 LAB — CBC WITH DIFFERENTIAL/PLATELET
Basophils Absolute: 0 10*3/uL (ref 0.0–0.1)
Basophils Relative: 0.6 % (ref 0.0–3.0)
Eosinophils Absolute: 0.1 10*3/uL (ref 0.0–0.7)
Eosinophils Relative: 2.7 % (ref 0.0–5.0)
HCT: 33.4 % — ABNORMAL LOW (ref 39.0–52.0)
Hemoglobin: 11.1 g/dL — ABNORMAL LOW (ref 13.0–17.0)
Lymphocytes Relative: 16.8 % (ref 12.0–46.0)
Lymphs Abs: 0.9 10*3/uL (ref 0.7–4.0)
MCHC: 33.1 g/dL (ref 30.0–36.0)
MCV: 84.6 fl (ref 78.0–100.0)
Monocytes Absolute: 0.5 10*3/uL (ref 0.1–1.0)
Monocytes Relative: 9.7 % (ref 3.0–12.0)
Neutro Abs: 3.7 10*3/uL (ref 1.4–7.7)
Neutrophils Relative %: 70.2 % (ref 43.0–77.0)
Platelets: 232 10*3/uL (ref 150.0–400.0)
RBC: 3.95 Mil/uL — ABNORMAL LOW (ref 4.22–5.81)
RDW: 15.3 % (ref 11.5–15.5)
WBC: 5.3 10*3/uL (ref 4.0–10.5)

## 2020-12-03 LAB — HEMOGLOBIN A1C: Hgb A1c MFr Bld: 6.3 % (ref 4.6–6.5)

## 2020-12-04 ENCOUNTER — Other Ambulatory Visit: Payer: Self-pay | Admitting: Family Medicine

## 2020-12-04 ENCOUNTER — Other Ambulatory Visit: Payer: Self-pay

## 2020-12-04 ENCOUNTER — Ambulatory Visit (INDEPENDENT_AMBULATORY_CARE_PROVIDER_SITE_OTHER): Payer: Medicare Other | Admitting: Interventional Cardiology

## 2020-12-04 ENCOUNTER — Encounter: Payer: Self-pay | Admitting: Interventional Cardiology

## 2020-12-04 VITALS — BP 112/58 | HR 78 | Ht 70.0 in | Wt 165.8 lb

## 2020-12-04 DIAGNOSIS — Z01810 Encounter for preprocedural cardiovascular examination: Secondary | ICD-10-CM | POA: Diagnosis not present

## 2020-12-04 DIAGNOSIS — N1831 Chronic kidney disease, stage 3a: Secondary | ICD-10-CM | POA: Diagnosis not present

## 2020-12-04 DIAGNOSIS — I10 Essential (primary) hypertension: Secondary | ICD-10-CM

## 2020-12-04 DIAGNOSIS — R9431 Abnormal electrocardiogram [ECG] [EKG]: Secondary | ICD-10-CM | POA: Diagnosis not present

## 2020-12-04 DIAGNOSIS — Z7189 Other specified counseling: Secondary | ICD-10-CM | POA: Diagnosis not present

## 2020-12-04 DIAGNOSIS — E785 Hyperlipidemia, unspecified: Secondary | ICD-10-CM

## 2020-12-04 DIAGNOSIS — N529 Male erectile dysfunction, unspecified: Secondary | ICD-10-CM

## 2020-12-04 NOTE — Patient Instructions (Signed)
Medication Instructions:  Your physician recommends that you continue on your current medications as directed. Please refer to the Current Medication list given to you today.  *If you need a refill on your cardiac medications before your next appointment, please call your pharmacy*   Lab Work: None If you have labs (blood work) drawn today and your tests are completely normal, you will receive your results only by: Marland Kitchen MyChart Message (if you have MyChart) OR . A paper copy in the mail If you have any lab test that is abnormal or we need to change your treatment, we will call you to review the results.   Testing/Procedures: Your physician has requested that you have an echocardiogram. Echocardiography is a painless test that uses sound waves to create images of your heart. It provides your doctor with information about the size and shape of your heart and how well your heart's chambers and valves are working. This procedure takes approximately one hour. There are no restrictions for this procedure.    Follow-Up: At [facility], you and your health needs are our priority.  As part of our continuing mission to provide you with exceptional heart care, we have created designated Provider Care Teams.  These Care Teams include your primary Cardiologist (physician) and Advanced Practice Providers (APPs -  Physician Assistants and Nurse Practitioners) who all work together to provide you with the care you need, when you need it.  We recommend signing up for the patient portal called "MyChart".  Sign up information is provided on this After Visit Summary.  MyChart is used to connect with patients for Virtual Visits (Telemedicine).  Patients are able to view lab/test results, encounter notes, upcoming appointments, etc.  Non-urgent messages can be sent to your provider as well.   To learn more about what you can do with MyChart, go to [email or web address].    Your next appointment:   As  needed  The format for your next appointment:   In Person  Provider:   You may see Dr. Daneen Schick or one of the following Advanced Practice Providers on your designated Care Team:    Kathyrn Drown, NP    Other Instructions

## 2020-12-05 ENCOUNTER — Other Ambulatory Visit: Payer: Self-pay

## 2020-12-05 NOTE — Patient Instructions (Addendum)
DUE TO COVID-19 ONLY ONE VISITOR IS ALLOWED TO COME WITH YOU AND STAY IN THE WAITING ROOM ONLY DURING PRE OP AND PROCEDURE DAY OF SURGERY. THE 1 VISITOR  MAY VISIT WITH YOU AFTER SURGERY IN YOUR PRIVATE ROOM DURING VISITING HOURS ONLY!  YOU NEED TO HAVE A COVID 19 TEST ON: 12/14/20 @ 10:00 AM , THIS TEST MUST BE DONE BEFORE SURGERY,  COVID TESTING SITE [place] [address], IT IS ON THE RIGHT GOING OUT [address] APPROXIMATELY  2 MINUTES PAST [employer] ON THE RIGHT. ONCE YOUR COVID TEST IS COMPLETED,  PLEASE BEGIN THE QUARANTINE INSTRUCTIONS AS OUTLINED IN YOUR HANDOUT.                Scott [name]    Your procedure is scheduled on: 12/18/20   Report to [facility] Main  Entrance   Report to admitting at: 9:00 AM     Call this number if you have problems the morning of surgery [phone]    Remember:   NO SOLID FOOD AFTER MIDNIGHT THE NIGHT PRIOR TO SURGERY. NOTHING BY MOUTH EXCEPT CLEAR LIQUIDS UNTIL: 8:30 AM . PLEASE FINISH ENSURE DRINK PER SURGEON ORDER  WHICH NEEDS TO BE COMPLETED AT: 8:30 AM .  CLEAR LIQUID DIET  Foods Allowed                                                                     Foods Excluded  Coffee and tea, regular and decaf                             liquids that you cannot  Plain Jell-O any favor except red or purple                                           see through such as: Fruit ices (not with fruit pulp)                                     milk, soups, orange juice  Iced Popsicles                                    All solid food Carbonated beverages, regular and diet                                    Cranberry, grape and apple juices Sports drinks like Gatorade Lightly seasoned clear broth or consume(fat free) Sugar, honey syrup  Sample Menu Breakfast                                Lunch  Supper Cranberry juice                    Beef broth                             Chicken broth Jell-O                                     Grape juice                           Apple juice Coffee or tea                        Jell-O                                      Popsicle                                                Coffee or tea                        Coffee or tea  ____________________________________________________________________   BRUSH YOUR TEETH MORNING OF SURGERY AND RINSE YOUR MOUTH OUT, NO CHEWING GUM CANDY OR MINTS.    Take these medicines the morning of surgery with A SIP OF WATER: allopurinol,colchicine.Use Flonase as usual.                               You may not have any metal on your body including hair pins and              piercings  Do not wear jewelry, lotions, powders or perfumes, deodorant             Men may shave face and neck.   Do not bring valuables to the hospital. [place].  Contacts, dentures or bridgework may not be worn into surgery.  Leave suitcase in the car. After surgery it may be brought to your room.     Patients discharged the day of surgery will not be allowed to drive home. IF YOU ARE HAVING SURGERY AND GOING HOME THE SAME DAY, YOU MUST HAVE AN ADULT TO DRIVE YOU HOME AND BE WITH YOU FOR 24 HOURS. YOU MAY GO HOME BY TAXI OR UBER OR ORTHERWISE, BUT AN ADULT MUST ACCOMPANY YOU HOME AND STAY WITH YOU FOR 24 HOURS.  Name and phone number of your driver:  Special Instructions: N/A              Please read over the following fact sheets you were given: _____________________________________________________________________        [facility] - Preparing for Surgery Before surgery, you can play an important role.  Because skin is not sterile, your skin needs to be as free of germs as possible.  You can reduce the number of germs on your skin by washing with CHG (  chlorahexidine gluconate) soap before surgery.  CHG is an antiseptic cleaner which kills germs and bonds with  the skin to continue killing germs even after washing. Please DO NOT use if you have an allergy to CHG or antibacterial soaps.  If your skin becomes reddened/irritated stop using the CHG and inform your nurse when you arrive at Short Stay. Do not shave (including legs and underarms) for at least 48 hours prior to the first CHG shower.  You may shave your face/neck. Please follow these instructions carefully:  1.  Shower with CHG Soap the night before surgery and the  morning of Surgery.  2.  If you choose to wash your hair, wash your hair first as usual with your  normal  shampoo.  3.  After you shampoo, rinse your hair and body thoroughly to remove the  shampoo.                           4.  Use CHG as you would any other liquid soap.  You can apply chg directly  to the skin and wash                       Gently with a scrungie or clean washcloth.  5.  Apply the CHG Soap to your body ONLY FROM THE NECK DOWN.   Do not use on face/ open                           Wound or open sores. Avoid contact with eyes, ears mouth and genitals (private parts).                       Wash face,  Genitals (private parts) with your normal soap.             6.  Wash thoroughly, paying special attention to the area where your surgery  will be performed.  7.  Thoroughly rinse your body with warm water from the neck down.  8.  DO NOT shower/wash with your normal soap after using and rinsing off  the CHG Soap.                9.  Pat yourself dry with a clean towel.            10.  Wear clean pajamas.            11.  Place clean sheets on your bed the night of your first shower and do not  sleep with pets. Day of Surgery : Do not apply any lotions/deodorants the morning of surgery.  Please wear clean clothes to the hospital/surgery center.  FAILURE TO FOLLOW THESE INSTRUCTIONS MAY RESULT IN THE CANCELLATION OF YOUR SURGERY PATIENT SIGNATURE_________________________________  NURSE  SIGNATURE__________________________________  ________________________________________________________________________   Scott [name]  An incentive spirometer is a tool that can help keep your lungs clear and active. This tool measures how well you are filling your lungs with each breath. Taking long deep breaths may help reverse or decrease the chance of developing breathing (pulmonary) problems (especially infection) following:  A long period of time when you are unable to move or be active. BEFORE THE PROCEDURE   If the spirometer includes an indicator to show your best effort, your nurse or respiratory therapist will set it to a desired goal.  If possible, sit up straight or lean  slightly forward. Try not to slouch.  Hold the incentive spirometer in an upright position. INSTRUCTIONS FOR USE  1. Sit on the edge of your bed if possible, or sit up as far as you can in bed or on a chair. 2. Hold the incentive spirometer in an upright position. 3. Breathe out normally. 4. Place the mouthpiece in your mouth and seal your lips tightly around it. 5. Breathe in slowly and as deeply as possible, raising the piston or the ball toward the top of the column. 6. Hold your breath for 3-5 seconds or for as long as possible. Allow the piston or ball to fall to the bottom of the column. 7. Remove the mouthpiece from your mouth and breathe out normally. 8. Rest for a few seconds and repeat Steps 1 through 7 at least 10 times every 1-2 hours when you are awake. Take your time and take a few normal breaths between deep breaths. 9. The spirometer may include an indicator to show your best effort. Use the indicator as a goal to work toward during each repetition. 10. After each set of 10 deep breaths, practice coughing to be sure your lungs are clear. If you have an incision (the cut made at the time of surgery), support your incision when coughing by placing a pillow or rolled up towels firmly  against it. Once you are able to get out of bed, walk around indoors and cough well. You may stop using the incentive spirometer when instructed by your caregiver.  RISKS AND COMPLICATIONS  Take your time so you do not get dizzy or light-headed.  If you are in pain, you may need to take or ask for pain medication before doing incentive spirometry. It is harder to take a deep breath if you are having pain. AFTER USE  Rest and breathe slowly and easily.  It can be helpful to keep track of a log of your progress. Your caregiver can provide you with a simple table to help with this. If you are using the spirometer at home, follow these instructions: [place] IF:   You are having difficultly using the spirometer.  You have trouble using the spirometer as often as instructed.  Your pain medication is not giving enough relief while using the spirometer.  You develop fever of 100.5 F (38.1 C) or higher. SEEK IMMEDIATE MEDICAL CARE IF:   You cough up bloody sputum that had not been present before.  You develop fever of 102 F (38.9 C) or greater.  You develop worsening pain at or near the incision site. MAKE SURE YOU:   Understand these instructions.  Will watch your condition.  Will get help right away if you are not doing well or get worse. Document Released: 03/01/2007 Document Revised: 01/11/2012 Document Reviewed: 05/02/2007 [facility] Patient Information 2014 [place], [place].   ________________________________________________________________________

## 2020-12-06 ENCOUNTER — Telehealth: Payer: Self-pay | Admitting: *Deleted

## 2020-12-06 NOTE — Telephone Encounter (Signed)
    [facility] Pre-operative Risk Assessment    HEARTCARE STAFF: - Please ensure there is not already an duplicate clearance open for this procedure. - Under Visit Info/Reason for Call, type in Other and utilize the format Clearance MM/DD/YY or Clearance TBD. Do not use dashes or single digits. - If request is for dental extraction, please clarify the # of teeth to be extracted.  Request for surgical clearance:  1. What type of surgery is being performed? LEFT TOTAL KNEE ARTHROPLASTY   2. When is this surgery scheduled? 12/18/20   3. What type of clearance is required (medical clearance vs. Pharmacy clearance to hold med vs. Both)? MEDICAL  4. Are there any medications that need to be held prior to surgery and how long? NONE LISTED   5. Practice name and name of physician performing surgery? [facility]; DR. Rod Can   6. What is the office phone number? [phone]   7.   What is the office fax number? [phone]  8.   Anesthesia type (None, local, MAC, general) ? SPINAL   Julaine Hua 12/06/2020, 5:14 PM  _________________________________________________________________   (provider comments below)

## 2020-12-09 ENCOUNTER — Encounter (HOSPITAL_COMMUNITY)
Admission: RE | Admit: 2020-12-09 | Discharge: 2020-12-09 | Disposition: A | Discharge status: Home / Self Care | Payer: Medicare Other | Source: Ambulatory Visit | Attending: Orthopedic Surgery | Admitting: Orthopedic Surgery

## 2020-12-09 ENCOUNTER — Encounter (HOSPITAL_COMMUNITY): Payer: Self-pay | Admitting: Physician Assistant

## 2020-12-09 ENCOUNTER — Other Ambulatory Visit: Payer: Self-pay

## 2020-12-09 ENCOUNTER — Encounter (HOSPITAL_COMMUNITY): Payer: Self-pay

## 2020-12-09 DIAGNOSIS — Z01812 Encounter for preprocedural laboratory examination: Secondary | ICD-10-CM | POA: Diagnosis not present

## 2020-12-09 HISTORY — DX: Unspecified osteoarthritis, unspecified site: M19.90

## 2020-12-09 HISTORY — DX: Abnormal electrocardiogram (ECG) (EKG): R94.31

## 2020-12-09 LAB — URINALYSIS, ROUTINE W REFLEX MICROSCOPIC
Bacteria, UA: NONE SEEN
Bilirubin Urine: NEGATIVE
Glucose, UA: NEGATIVE mg/dL
Hgb urine dipstick: NEGATIVE
Ketones, ur: NEGATIVE mg/dL
Leukocytes,Ua: NEGATIVE
Nitrite: NEGATIVE
Protein, ur: 30 mg/dL — AB
Specific Gravity, Urine: 1.009 (ref 1.005–1.030)
pH: 6 (ref 5.0–8.0)

## 2020-12-09 LAB — CBC
HCT: 32.2 % — ABNORMAL LOW (ref 39.0–52.0)
Hemoglobin: 10.2 g/dL — ABNORMAL LOW (ref 13.0–17.0)
MCH: 27.5 pg (ref 26.0–34.0)
MCHC: 31.7 g/dL (ref 30.0–36.0)
MCV: 86.8 fL (ref 80.0–100.0)
Platelets: 217 10*3/uL (ref 150–400)
RBC: 3.71 MIL/uL — ABNORMAL LOW (ref 4.22–5.81)
RDW: 15 % (ref 11.5–15.5)
WBC: 4.9 10*3/uL (ref 4.0–10.5)
nRBC: 0 % (ref 0.0–0.2)

## 2020-12-09 LAB — SURGICAL PCR SCREEN
MRSA, PCR: NEGATIVE
Staphylococcus aureus: NEGATIVE

## 2020-12-09 LAB — COMPREHENSIVE METABOLIC PANEL
ALT: 19 U/L (ref 0–44)
AST: 30 U/L (ref 15–41)
Albumin: 3.8 g/dL (ref 3.5–5.0)
Alkaline Phosphatase: 103 U/L (ref 38–126)
Anion gap: 9 (ref 5–15)
BUN: 12 mg/dL (ref 8–23)
CO2: 28 mmol/L (ref 22–32)
Calcium: 9.3 mg/dL (ref 8.9–10.3)
Chloride: 97 mmol/L — ABNORMAL LOW (ref 98–111)
Creatinine, Ser: 1.25 mg/dL — ABNORMAL HIGH (ref 0.61–1.24)
GFR, Estimated: 60 mL/min — ABNORMAL LOW (ref >60)
Glucose, Bld: 89 mg/dL (ref 70–99)
Potassium: 3.7 mmol/L (ref 3.5–5.1)
Sodium: 134 mmol/L — ABNORMAL LOW (ref 135–145)
Total Bilirubin: 0.7 mg/dL (ref 0.3–1.2)
Total Protein: 6.8 g/dL (ref 6.5–8.1)

## 2020-12-09 LAB — PROTIME-INR
INR: 1.1 (ref 0.8–1.2)
Prothrombin Time: 14.2 seconds (ref 11.4–15.2)

## 2020-12-09 NOTE — Telephone Encounter (Signed)
I will fax Dr. Thompson Caul note dated 12/04/20 that included clearance for knee surgery.

## 2020-12-09 NOTE — Progress Notes (Addendum)
COVID Vaccine Completed: Yes Date COVID Vaccine completed: 08/07/20 [place] COVID vaccine manufacturer: Pfizer       PCP - Dr. Grier Mitts Cardiologist - Dr. Daneen Schick. LOV: 12/04/20  Chest x-ray -  EKG -  12/02/20 Stress Test -  ECHO - 12/04/20 Cardiac Cath -  Pacemaker/ICD device last checked:  Sleep Study -  CPAP -   Fasting Blood Sugar -  Checks Blood Sugar _____ times a day  Blood Thinner Instructions: Aspirin Instructions: Last Dose:  Anesthesia review: Hx: HTN,CKD III,Abnormal EKG  Patient denies shortness of breath, fever, cough and chest pain at PAT appointment   Patient verbalized understanding of instructions that were given to them at the PAT appointment. Patient was also instructed that they will need to review over the PAT instructions again at home before surgery.

## 2020-12-11 ENCOUNTER — Other Ambulatory Visit: Payer: Self-pay

## 2020-12-11 ENCOUNTER — Encounter: Payer: Self-pay | Admitting: Family Medicine

## 2020-12-11 ENCOUNTER — Ambulatory Visit (INDEPENDENT_AMBULATORY_CARE_PROVIDER_SITE_OTHER): Payer: Medicare Other | Admitting: Family Medicine

## 2020-12-11 VITALS — BP 128/62 | HR 78 | Temp 98.3°F | Wt 170.0 lb

## 2020-12-11 DIAGNOSIS — N1831 Chronic kidney disease, stage 3a: Secondary | ICD-10-CM | POA: Diagnosis not present

## 2020-12-11 DIAGNOSIS — E7841 Elevated Lipoprotein(a): Secondary | ICD-10-CM

## 2020-12-11 DIAGNOSIS — E871 Hypo-osmolality and hyponatremia: Secondary | ICD-10-CM

## 2020-12-11 DIAGNOSIS — I10 Essential (primary) hypertension: Secondary | ICD-10-CM

## 2020-12-11 DIAGNOSIS — D631 Anemia in chronic kidney disease: Secondary | ICD-10-CM | POA: Diagnosis not present

## 2020-12-11 DIAGNOSIS — M1712 Unilateral primary osteoarthritis, left knee: Secondary | ICD-10-CM

## 2020-12-11 NOTE — Patient Instructions (Addendum)
Continue losartan 100 mg daily.  Right now you are taking two of the losartan 50 mg tabs to achieve this dose.  When you are almost out of medication please notify the clinic so that we can send in a new prescription for the increased dose.  Start checking your blood pressure at home and keeping a log to bring with you to clinic.  We will stop the triamterene-hydrochlorothiazide as it seems to have been causing her hyponatremia (low sodium).  Call your orthopedic doctor about your surgery since your hemoglobin was low at 10.1  You can take 1 iron tab twice a day to help improve your hemoglobin.  Iron can make stools appear darker in color and cause constipation.  You can increase your dose of MiraLAX from once a day to twice a day to help prevent constipation.  If your stools become too loose you can decrease the amount of MiraLAX you are taking.  You should work on increasing your intake of vegetables and iron rich foods as well as increasing the amount of water you are drinking daily.

## 2020-12-11 NOTE — Progress Notes (Signed)
Subjective:    Patient ID: Scott [name], male    DOB: 04-10-1946, 75 y.o.   MRN: [id number]  No chief complaint on file.   HPI Pt is a 75 yo male with pmh sig for HTN, GERD, OA of L knee CKD III, gout, ED, B12 def, anemia, HLD who was seen for f/u on preop evaluation for left TKR.  Sent to Cards, Dr. Tamala Julian, for eval after subtle conduction changes noted on EKG.  Labs obtained in clinic on 1/30 with chronic hyponatremia and anemia.  Pt stopped triamterene-hydrochlorothiazide and started Iron supplement as advised.  Labs on 2/7 at the hospital that showed improvement in sodium but worsened anemia , hgb 11.1=>10.2.   Pt feeling ok aside from left knee pain.  May eat once or twice per day as his wife is cooking less.  Pt likes vegetables.  Drinking Sprite and root beer throughout the day.  May have 3 glasses of water. Eating candy daily, likes Ronalee Belts and Ikes.  Past Medical History:  Diagnosis Date  . ANEMIA, OTHER UNSPEC 08/02/2009  . Arthritis   . B12 DEFICIENCY 07/07/2007  . Bell's palsy     around 2005  . BPPV (benign paroxysmal positional vertigo)    2003  . Cataract   . EKG abnormalities   . GERD 07/13/2007  . Gout, unspecified 06/17/2010  . HYPERLIPIDEMIA 07/13/2007  . HYPERTENSION 07/13/2007  . RENAL INSUFFICIENCY 06/04/2010    Allergies  Allergen Reactions  . Nsaids     REACTION: hx of renal insufficiency  . Statins     myalgias    ROS General: Denies fever, chills, night sweats, changes in weight, changes in appetite HEENT: Denies headaches, ear pain, changes in vision, rhinorrhea, sore throat CV: Denies CP, palpitations, SOB, orthopnea Pulm: Denies SOB, cough, wheezing GI: Denies abdominal pain, nausea, vomiting, diarrhea, constipation GU: Denies dysuria, hematuria, frequency. Msk: Denies muscle cramps, joint pains + left knee pain. Neuro: Denies weakness, numbness, tingling Skin: Denies rashes, bruising Psych: Denies depression, anxiety, hallucinations    Objective:     Blood pressure 136/78, pulse 78, temperature 98.3 F (36.8 C), temperature source Oral, weight 170 lb (77.1 kg), SpO2 99 %.  Gen. Pleasant, well-nourished, in no distress, normal affect  HEENT: [place]/AT, face symmetric, conjunctiva clear, no scleral icterus, PERRLA, EOMI, nares patent without drainage Lungs: no accessory muscle use Cardiovascular: RRR, no peripheral edema Musculoskeletal: Left knee edema.  No deformities, no cyanosis or clubbing, normal tone Neuro:  A&Ox3, CN II-XII intact, slowed gait, ambulating with crutch Skin:  Warm, no lesions/ rash  Wt Readings from Last 3 Encounters:  12/04/20 165 lb 12.8 oz (75.2 kg)  12/02/20 165 lb 9.6 oz (75.1 kg)  10/17/20 176 lb 6.4 oz (80 kg)    Lab Results  Component Value Date   WBC 4.9 12/09/2020   HGB 10.2 (L) 12/09/2020   HCT 32.2 (L) 12/09/2020   PLT 217 12/09/2020   GLUCOSE 89 12/09/2020   CHOL 191 07/19/2020   TRIG 70 07/19/2020   HDL 48 07/19/2020   LDLDIRECT 131.0 01/24/2016   LDLCALC 127 (H) 07/19/2020   ALT 19 12/09/2020   AST 30 12/09/2020   NA 134 (L) 12/09/2020   K 3.7 12/09/2020   CL 97 (L) 12/09/2020   CREATININE 1.25 (H) 12/09/2020   BUN 12 12/09/2020   CO2 28 12/09/2020   TSH 1.93 10/10/2014   PSA 3.52 03/30/2017   INR 1.1 12/09/2020   HGBA1C 6.3 12/02/2020  Assessment/Plan:  Essential hypertension -Controlled -d/c triamterene-hydrochlorothiazide 37.5-25 mg -Continue losartan 100 mg daily.  Will send in Rx for increased dose in 3-4 weeks as currently taking two losartan 50 mg tabs. -Patient encouraged to check BP at home and keep a log to bring with him to clinic -Lifestyle medications encouraged including increasing p.o. intake of water -Continue to monitor.  Primary osteoarthritis of left knee -Continue supportive care -Contacting Ortho in regards to L TKR as may need to reschedule given anemia (hgb 10.2).  Anemia due to stage 3a chronic kidney disease ([place]) -likely 2/2 CKD 3 and iron  def. -consider increasing iron supplementation to BID and continue miralax for any constipation. -discussed repeat hgb likely needed prior to surgery approval. -Avoid nephrotoxic medications and renally dose medications -Patient encouraged to increase p.o. 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  134 on 12/09/20 -will continue to monitor  F/u in 4-6 wks, sooner if needed.  Grier Mitts, MD

## 2020-12-14 ENCOUNTER — Other Ambulatory Visit (HOSPITAL_COMMUNITY): Payer: Medicare Other

## 2020-12-16 ENCOUNTER — Other Ambulatory Visit (INDEPENDENT_AMBULATORY_CARE_PROVIDER_SITE_OTHER): Payer: Medicare Other

## 2020-12-16 ENCOUNTER — Other Ambulatory Visit: Payer: Self-pay

## 2020-12-16 DIAGNOSIS — D649 Anemia, unspecified: Secondary | ICD-10-CM

## 2020-12-16 DIAGNOSIS — E871 Hypo-osmolality and hyponatremia: Secondary | ICD-10-CM

## 2020-12-17 ENCOUNTER — Telehealth: Payer: Self-pay | Admitting: Family Medicine

## 2020-12-17 LAB — CBC WITH DIFFERENTIAL/PLATELET
Basophils Absolute: 0 10*3/uL (ref 0.0–0.1)
Basophils Relative: 0.9 % (ref 0.0–3.0)
Eosinophils Absolute: 0.1 10*3/uL (ref 0.0–0.7)
Eosinophils Relative: 1.8 % (ref 0.0–5.0)
HCT: 29.6 % — ABNORMAL LOW (ref 39.0–52.0)
Hemoglobin: 9.8 g/dL — ABNORMAL LOW (ref 13.0–17.0)
Lymphocytes Relative: 18.8 % (ref 12.0–46.0)
Lymphs Abs: 1 10*3/uL (ref 0.7–4.0)
MCHC: 33.2 g/dL (ref 30.0–36.0)
MCV: 85.7 fl (ref 78.0–100.0)
Monocytes Absolute: 0.5 10*3/uL (ref 0.1–1.0)
Monocytes Relative: 9.1 % (ref 3.0–12.0)
Neutro Abs: 3.8 10*3/uL (ref 1.4–7.7)
Neutrophils Relative %: 69.4 % (ref 43.0–77.0)
Platelets: 180 10*3/uL (ref 150.0–400.0)
RBC: 3.45 Mil/uL — ABNORMAL LOW (ref 4.22–5.81)
RDW: 16.6 % — ABNORMAL HIGH (ref 11.5–15.5)
WBC: 5.5 10*3/uL (ref 4.0–10.5)

## 2020-12-17 LAB — BASIC METABOLIC PANEL
BUN: 20 mg/dL (ref 6–23)
CO2: 31 mEq/L (ref 19–32)
Calcium: 9 mg/dL (ref 8.4–10.5)
Chloride: 100 mEq/L (ref 96–112)
Creatinine, Ser: 1.44 mg/dL (ref 0.40–1.50)
GFR: 47.04 mL/min — ABNORMAL LOW (ref >60.00)
Glucose, Bld: 89 mg/dL (ref 70–99)
Potassium: 4.4 mEq/L (ref 3.5–5.1)
Sodium: 136 mEq/L (ref 135–145)

## 2020-12-17 NOTE — Telephone Encounter (Signed)
Pt is calling in to see if he can get his lab results so that he can get his knee surgery rescheduled.  Pt would like to have a call back.

## 2020-12-18 ENCOUNTER — Ambulatory Visit (HOSPITAL_COMMUNITY): Admission: RE | Admit: 2020-12-18 | Payer: Medicare Other | Source: Home / Self Care | Admitting: Orthopedic Surgery

## 2020-12-18 ENCOUNTER — Encounter (HOSPITAL_COMMUNITY): Admission: RE | Payer: Self-pay | Source: Home / Self Care

## 2020-12-18 LAB — TYPE AND SCREEN
ABO/RH(D): A POS
Antibody Screen: NEGATIVE

## 2020-12-18 SURGERY — ARTHROPLASTY, KNEE, TOTAL, USING IMAGELESS COMPUTER-ASSISTED NAVIGATION
Anesthesia: Spinal | Site: Knee | Laterality: Left

## 2020-12-18 NOTE — Telephone Encounter (Signed)
Spoke with pt aware that the office will call him back regarding his lab results as soon as Dr Volanda Napoleon views results

## 2020-12-19 ENCOUNTER — Telehealth: Payer: Self-pay | Admitting: Family Medicine

## 2020-12-19 NOTE — Telephone Encounter (Signed)
The patient returning a call about lab results informed him that what nancy said about Dr banks has not reviewed his labs. Pt understood and said he would like nancy to call him back. He has to swelling in his leg

## 2020-12-20 ENCOUNTER — Other Ambulatory Visit: Payer: Self-pay

## 2020-12-23 NOTE — Telephone Encounter (Signed)
Reviewed lab results with pt, aware that he is scheduled for Iron infusion on 12/24/2020

## 2020-12-24 ENCOUNTER — Other Ambulatory Visit: Payer: Self-pay

## 2020-12-24 ENCOUNTER — Non-Acute Institutional Stay (HOSPITAL_COMMUNITY)
Admission: RE | Admit: 2020-12-24 | Discharge: 2020-12-24 | Disposition: A | Discharge status: Home / Self Care | Payer: Medicare Other | Source: Ambulatory Visit | Attending: Internal Medicine | Admitting: Internal Medicine

## 2020-12-24 DIAGNOSIS — D649 Anemia, unspecified: Secondary | ICD-10-CM | POA: Diagnosis not present

## 2020-12-24 MED ORDER — SODIUM CHLORIDE 0.9 % IV SOLN
INTRAVENOUS | Status: DC | PRN
  Administered 2020-12-24: 250 mL via INTRAVENOUS

## 2020-12-24 MED ORDER — SODIUM CHLORIDE 0.9 % IV SOLN
510.0000 mg | Freq: Once | INTRAVENOUS | Status: AC
  Administered 2020-12-24: 510 mg via INTRAVENOUS
  Filled 2020-12-24: qty 510

## 2020-12-24 NOTE — Discharge Instructions (Signed)
Ferumoxytol injection What is this medicine? FERUMOXYTOL is an iron complex. Iron is used to make healthy red blood cells, which carry oxygen and nutrients throughout the body. This medicine is used to treat iron deficiency anemia. This medicine may be used for other purposes; ask your health care provider or pharmacist if you have questions. COMMON BRAND NAME(S): Feraheme What should I tell my health care provider before I take this medicine? They need to know if you have any of these conditions:  anemia not caused by low iron levels  high levels of iron in the blood  magnetic resonance imaging (MRI) test scheduled  an unusual or allergic reaction to iron, other medicines, foods, dyes, or preservatives  pregnant or trying to get pregnant  breast-feeding How should I use this medicine? This medicine is for injection into a vein. It is given by a health care professional in a hospital or clinic setting. Talk to your pediatrician regarding the use of this medicine in children. Special care may be needed. Overdosage: If you think you have taken too much of this medicine contact a poison control center or emergency room at once. NOTE: This medicine is only for you. Do not share this medicine with others. What if I miss a dose? It is important not to miss your dose. Call your doctor or health care professional if you are unable to keep an appointment. What may interact with this medicine? This medicine may interact with the following medications:  other iron products This list may not describe all possible interactions. Give your health care provider a list of all the medicines, herbs, non-prescription drugs, or dietary supplements you use. Also tell them if you smoke, drink alcohol, or use illegal drugs. Some items may interact with your medicine. What should I watch for while using this medicine? Visit your doctor or healthcare professional regularly. Tell your doctor or healthcare  professional if your symptoms do not start to get better or if they get worse. You may need blood work done while you are taking this medicine. You may need to follow a special diet. Talk to your doctor. Foods that contain iron include: whole grains/cereals, dried fruits, beans, or peas, leafy green vegetables, and organ meats (liver, kidney). What side effects may I notice from receiving this medicine? Side effects that you should report to your doctor or health care professional as soon as possible:  allergic reactions like skin rash, itching or hives, swelling of the face, lips, or tongue  breathing problems  changes in blood pressure  feeling faint or lightheaded, falls  fever or chills  flushing, sweating, or hot feelings  swelling of the ankles or feet Side effects that usually do not require medical attention (report to your doctor or health care professional if they continue or are bothersome):  diarrhea  headache  nausea, vomiting  stomach pain This list may not describe all possible side effects. Call your doctor for medical advice about side effects. You may report side effects to FDA at 1-800-FDA-1088. Where should I keep my medicine? This drug is given in a hospital or clinic and will not be stored at home. NOTE: This sheet is a summary. It may not cover all possible information. If you have questions about this medicine, talk to your doctor, pharmacist, or health care provider.  2021 Elsevier/Gold Standard (2016-12-07 20:21:10)  

## 2020-12-24 NOTE — Progress Notes (Signed)
PATIENT CARE CENTER NOTE  Diagnosis: Anemia D64.9   Provider: Grier Mitts, MD   Procedure: Feraheme infusion   Note: Patient received Feraheme infusion via PIV. Tolerated well with no adverse reaction. Observed patient for 30 minutes post-infusion. Patient's BP was elevated post-infusion. Per patient, he recently was started on new BP medications and he has not been monitoring his BP at home as directed.  Patient advised to follow up with PCP concerning BP.  Discharge instructions given. Patient alert, oriented and ambulatory at discharge.

## 2020-12-25 ENCOUNTER — Other Ambulatory Visit: Payer: Self-pay

## 2020-12-25 ENCOUNTER — Ambulatory Visit: Payer: Medicare Other | Admitting: Family Medicine

## 2020-12-30 ENCOUNTER — Other Ambulatory Visit: Payer: Self-pay

## 2020-12-30 DIAGNOSIS — N1831 Chronic kidney disease, stage 3a: Secondary | ICD-10-CM

## 2020-12-31 ENCOUNTER — Other Ambulatory Visit: Payer: Self-pay

## 2020-12-31 ENCOUNTER — Other Ambulatory Visit (INDEPENDENT_AMBULATORY_CARE_PROVIDER_SITE_OTHER): Payer: Medicare Other

## 2020-12-31 DIAGNOSIS — N1831 Chronic kidney disease, stage 3a: Secondary | ICD-10-CM | POA: Diagnosis not present

## 2020-12-31 DIAGNOSIS — D631 Anemia in chronic kidney disease: Secondary | ICD-10-CM | POA: Diagnosis not present

## 2020-12-31 LAB — CBC WITH DIFFERENTIAL/PLATELET
Basophils Absolute: 0 10*3/uL (ref 0.0–0.1)
Basophils Relative: 0.7 % (ref 0.0–3.0)
Eosinophils Absolute: 0.1 10*3/uL (ref 0.0–0.7)
Eosinophils Relative: 1.8 % (ref 0.0–5.0)
HCT: 30.5 % — ABNORMAL LOW (ref 39.0–52.0)
Hemoglobin: 10.1 g/dL — ABNORMAL LOW (ref 13.0–17.0)
Lymphocytes Relative: 17.3 % (ref 12.0–46.0)
Lymphs Abs: 1 10*3/uL (ref 0.7–4.0)
MCHC: 33.1 g/dL (ref 30.0–36.0)
MCV: 85.9 fl (ref 78.0–100.0)
Monocytes Absolute: 1 10*3/uL (ref 0.1–1.0)
Monocytes Relative: 17.9 % — ABNORMAL HIGH (ref 3.0–12.0)
Neutro Abs: 3.4 10*3/uL (ref 1.4–7.7)
Neutrophils Relative %: 62.3 % (ref 43.0–77.0)
Platelets: 165 10*3/uL (ref 150.0–400.0)
RBC: 3.56 Mil/uL — ABNORMAL LOW (ref 4.22–5.81)
RDW: 16.6 % — ABNORMAL HIGH (ref 11.5–15.5)
WBC: 5.5 10*3/uL (ref 4.0–10.5)

## 2021-01-02 ENCOUNTER — Telehealth: Payer: Self-pay | Admitting: Family Medicine

## 2021-01-02 NOTE — Telephone Encounter (Signed)
Pt calling for test results. Please advise. [phone]

## 2021-01-03 ENCOUNTER — Other Ambulatory Visit: Payer: Self-pay | Admitting: Family Medicine

## 2021-01-03 DIAGNOSIS — D509 Iron deficiency anemia, unspecified: Secondary | ICD-10-CM

## 2021-01-03 DIAGNOSIS — D631 Anemia in chronic kidney disease: Secondary | ICD-10-CM

## 2021-01-03 DIAGNOSIS — N1831 Chronic kidney disease, stage 3a: Secondary | ICD-10-CM

## 2021-01-03 NOTE — Telephone Encounter (Signed)
Spoke with patient,  verbalized results. Referral placed for Hemotology.

## 2021-01-03 NOTE — Progress Notes (Signed)
Spoke with patient,  verbalized results. Referral placed for Hemotology.

## 2021-01-07 ENCOUNTER — Telehealth: Payer: Self-pay | Admitting: Family Medicine

## 2021-01-07 ENCOUNTER — Telehealth: Payer: Self-pay | Admitting: Internal Medicine

## 2021-01-07 NOTE — Telephone Encounter (Signed)
Pt is calling in stating that his L leg is still swelling and would like to see what Dr. Volanda Napoleon would like for him to do due to to him being in a lot of pain.  Pt would like to have a call back to let him know.

## 2021-01-07 NOTE — Telephone Encounter (Signed)
Received a new hem referral from Scott [name] for microcytic anemia. Scott [name] returned my call and has been scheduled to see Scott [name] on 3/14 at 11:45am w/labs at 11:15am.

## 2021-01-08 NOTE — Telephone Encounter (Signed)
Please advise on below message.

## 2021-01-10 ENCOUNTER — Other Ambulatory Visit: Payer: Self-pay | Admitting: Internal Medicine

## 2021-01-10 DIAGNOSIS — D539 Nutritional anemia, unspecified: Secondary | ICD-10-CM

## 2021-01-13 ENCOUNTER — Inpatient Hospital Stay: Payer: Medicare Other

## 2021-01-13 ENCOUNTER — Inpatient Hospital Stay: Payer: Medicare Other | Attending: Internal Medicine | Admitting: Internal Medicine

## 2021-01-13 ENCOUNTER — Other Ambulatory Visit: Payer: Self-pay | Admitting: Internal Medicine

## 2021-01-13 ENCOUNTER — Other Ambulatory Visit: Payer: Self-pay

## 2021-01-13 VITALS — BP 180/80 | HR 76 | Temp 98.0°F | Resp 18 | Ht 70.0 in | Wt 171.9 lb

## 2021-01-13 DIAGNOSIS — D649 Anemia, unspecified: Secondary | ICD-10-CM | POA: Insufficient documentation

## 2021-01-13 DIAGNOSIS — D509 Iron deficiency anemia, unspecified: Secondary | ICD-10-CM | POA: Diagnosis not present

## 2021-01-13 DIAGNOSIS — D500 Iron deficiency anemia secondary to blood loss (chronic): Secondary | ICD-10-CM

## 2021-01-13 DIAGNOSIS — I10 Essential (primary) hypertension: Secondary | ICD-10-CM

## 2021-01-13 DIAGNOSIS — E538 Deficiency of other specified B group vitamins: Secondary | ICD-10-CM | POA: Diagnosis not present

## 2021-01-13 DIAGNOSIS — D539 Nutritional anemia, unspecified: Secondary | ICD-10-CM

## 2021-01-13 LAB — CMP (CANCER CENTER ONLY)
ALT: 18 U/L (ref 0–44)
AST: 26 U/L (ref 15–41)
Albumin: 3.7 g/dL (ref 3.5–5.0)
Alkaline Phosphatase: 112 U/L (ref 38–126)
Anion gap: 6 (ref 5–15)
BUN: 8 mg/dL (ref 8–23)
CO2: 30 mmol/L (ref 22–32)
Calcium: 9.1 mg/dL (ref 8.9–10.3)
Chloride: 101 mmol/L (ref 98–111)
Creatinine: 1.2 mg/dL (ref 0.61–1.24)
GFR, Estimated: >60 mL/min (ref >60)
Glucose, Bld: 79 mg/dL (ref 70–99)
Potassium: 3.8 mmol/L (ref 3.5–5.1)
Sodium: 137 mmol/L (ref 135–145)
Total Bilirubin: 0.5 mg/dL (ref 0.3–1.2)
Total Protein: 6.7 g/dL (ref 6.5–8.1)

## 2021-01-13 LAB — CBC WITH DIFFERENTIAL (CANCER CENTER ONLY)
Abs Immature Granulocytes: 0.01 10*3/uL (ref 0.00–0.07)
Basophils Absolute: 0 10*3/uL (ref 0.0–0.1)
Basophils Relative: 1 %
Eosinophils Absolute: 0.1 10*3/uL (ref 0.0–0.5)
Eosinophils Relative: 1 %
HCT: 31.8 % — ABNORMAL LOW (ref 39.0–52.0)
Hemoglobin: 10.3 g/dL — ABNORMAL LOW (ref 13.0–17.0)
Immature Granulocytes: 0 %
Lymphocytes Relative: 17 %
Lymphs Abs: 1 10*3/uL (ref 0.7–4.0)
MCH: 27.9 pg (ref 26.0–34.0)
MCHC: 32.4 g/dL (ref 30.0–36.0)
MCV: 86.2 fL (ref 80.0–100.0)
Monocytes Absolute: 0.7 10*3/uL (ref 0.1–1.0)
Monocytes Relative: 12 %
Neutro Abs: 4.2 10*3/uL (ref 1.7–7.7)
Neutrophils Relative %: 69 %
Platelet Count: 192 10*3/uL (ref 150–400)
RBC: 3.69 MIL/uL — ABNORMAL LOW (ref 4.22–5.81)
RDW: 15.4 % (ref 11.5–15.5)
WBC Count: 6 10*3/uL (ref 4.0–10.5)
nRBC: 0 % (ref 0.0–0.2)

## 2021-01-13 LAB — RETICULOCYTES
Immature Retic Fract: 5.7 % (ref 2.3–15.9)
RBC.: 3.64 MIL/uL — ABNORMAL LOW (ref 4.22–5.81)
Retic Count, Absolute: 62.6 10*3/uL (ref 19.0–186.0)
Retic Ct Pct: 1.7 % (ref 0.4–3.1)

## 2021-01-13 LAB — IRON AND TIBC
Iron: 76 ug/dL (ref 42–163)
Saturation Ratios: 29 % (ref 20–55)
TIBC: 265 ug/dL (ref 202–409)
UIBC: 189 ug/dL (ref 117–376)

## 2021-01-13 LAB — FOLATE: Folate: 7.1 ng/mL (ref >5.9)

## 2021-01-13 LAB — VITAMIN B12: Vitamin B-12: 587 pg/mL (ref 180–914)

## 2021-01-13 LAB — FERRITIN: Ferritin: 665 ng/mL — ABNORMAL HIGH (ref 24–336)

## 2021-01-13 NOTE — Progress Notes (Signed)
[name] [phone]   [phone]  CONSULT NOTE  REFERRING PHYSICIAN: Dr. Grier [name]  REASON FOR CONSULTATION:  75 years old African-American male with persistent anemia.  HPI Scott MOLESWORTH Sr. is a 75 y.o. male with past medical history significant for hypertension, dyslipidemia, renal insufficiency, gout, GERD, B12 deficiency as well as history of anemia for several years.  The patient was supposed to have left knee replacement on December 18, 2020.  Preoperative work-up including CBC showed persistent anemia with hemoglobin of 9.8.  Her surgery was delayed and the patient received iron infusion in the hospital for 1 dose.  He also started on oral iron tablet over-the-counter twice daily for the last several weeks.  He is feeling fine with no complaints.  He has history of anemia for several years that had been on the range of 11-12.  He had colonoscopy few years ago that was unremarkable except for internal hemorrhoids.  He has been treated in the past with vitamin B12 injection on monthly basis but during the Covid 19 pandemic he was started on oral vitamin B12 tablets. When seen today he has no fatigue or weakness.  He denied having any dizzy spells.  He has no chest pain, shortness of breath, cough or hemoptysis.  He denied having any bleeding, bruises or ecchymosis.  He has no headache or visual changes. Family history significant for father with MS. The patient is married and has 4 children.  He used to work at the [employer].  He has no history of smoking, alcohol or drug abuse.   HPI  Past Medical History:  Diagnosis Date  . ANEMIA, OTHER UNSPEC 08/02/2009  . Arthritis   . B12 DEFICIENCY 07/07/2007  . Bell's palsy     around 2005  . BPPV (benign paroxysmal positional vertigo)    2003  . Cataract   . EKG abnormalities   . GERD 07/13/2007  . Gout, unspecified 06/17/2010  . HYPERLIPIDEMIA 07/13/2007  . HYPERTENSION  07/13/2007  . RENAL INSUFFICIENCY 06/04/2010    Past Surgical History:  Procedure Laterality Date  . BACK SURGERY  07/13/07  . CARPAL TUNNEL RELEASE Bilateral   . CATARACT EXTRACTION, BILATERAL Bilateral     Family History  Problem Relation Age of Onset  . Arthritis Father     Social History Social History   Tobacco Use  . Smoking status: Never Smoker  . Smokeless tobacco: Never Used  Vaping Use  . Vaping Use: Never used  Substance Use Topics  . Alcohol use: No  . Drug use: No    Allergies  Allergen Reactions  . Nsaids     REACTION: hx of renal insufficiency  . Statins     myalgias    Current Outpatient Medications  Medication Sig Dispense Refill  . allopurinol (ZYLOPRIM) 300 MG tablet TAKE 1 TABLET BY MOUTH EVERY DAY 90 tablet 3  . celecoxib (CELEBREX) 100 MG capsule TAKE 1 CAPSULE (100 MG TOTAL) BY MOUTH 2 (TWO) TIMES DAILY AS NEEDED FOR MODERATE PAIN (SHOULDER). 60 capsule 5  . colchicine 0.6 MG tablet TAKE 1 TABLET BY MOUTH EVERY DAY 30 tablet 1  . Cyanocobalamin 1000 MCG SUBL Place 1 tablet (1,000 mcg total) under the tongue once a week. 4 each 4  . fluticasone (FLONASE) 50 MCG/ACT nasal spray SPRAY 2 SPRAYS INTO EACH NOSTRIL EVERY DAY 48 mL 1  . losartan (COZAAR) 50 MG tablet TAKE 1 TABLET BY MOUTH EVERY DAY 90  tablet 1  . Polyethylene Glycol 3350 POWD Take 17 g by mouth daily.    . sildenafil (VIAGRA) 50 MG tablet Take one half tab (25 mg) daily as needed 1 hour prior to sexual activity. 10 tablet 0  . traMADol (ULTRAM) 50 MG tablet TAKE 2 TABLETS BY MOUTH (TWO) TIMES DAILY. 120 tablet 1  . triamterene-hydrochlorothiazide (MAXZIDE-25) 37.5-25 MG tablet TAKE 1 TABLET BY MOUTH EVERY DAY 90 tablet 3   No current facility-administered medications for this visit.    Review of Systems  Constitutional: negative Eyes: negative Ears, nose, mouth, throat, and face: negative Respiratory: negative Cardiovascular: negative Gastrointestinal:  negative Genitourinary:negative Integument/breast: negative Hematologic/lymphatic: negative Musculoskeletal:positive for arthralgias Neurological: negative Behavioral/Psych: negative Endocrine: negative Allergic/Immunologic: negative  Physical Exam  OAC:ZYSAY, healthy, no distress, well nourished and well developed SKIN: skin color, texture, turgor are normal, no rashes or significant lesions HEAD: Normocephalic, No masses, lesions, tenderness or abnormalities EYES: normal, PERRLA, Conjunctiva are pink and non-injected EARS: External ears normal, Canals clear OROPHARYNX:no exudate, no erythema and lips, buccal mucosa, and tongue normal  NECK: supple, no adenopathy, no JVD LYMPH:  no palpable lymphadenopathy, no hepatosplenomegaly LUNGS: clear to auscultation , and palpation HEART: regular rate & rhythm, no murmurs and no gallops ABDOMEN:abdomen soft, non-tender, normal bowel sounds and no masses or organomegaly BACK: No CVA tenderness, Range of motion is normal EXTREMITIES:no joint deformities, effusion, or inflammation, no edema  NEURO: alert & oriented x 3 with fluent speech, no focal motor/sensory deficits  PERFORMANCE STATUS: ECOG 1  LABORATORY DATA: Lab Results  Component Value Date   WBC 6.0 01/13/2021   HGB 10.3 (L) 01/13/2021   HCT 31.8 (L) 01/13/2021   MCV 86.2 01/13/2021   PLT 192 01/13/2021      Chemistry      Component Value Date/Time   NA 137 01/13/2021 1116   K 3.8 01/13/2021 1116   CL 101 01/13/2021 1116   CO2 30 01/13/2021 1116   BUN 8 01/13/2021 1116   CREATININE 1.20 01/13/2021 1116   CREATININE 1.66 (H) 07/19/2020 1028      Component Value Date/Time   CALCIUM 9.1 01/13/2021 1116   ALKPHOS 112 01/13/2021 1116   AST 26 01/13/2021 1116   ALT 18 01/13/2021 1116   BILITOT 0.5 01/13/2021 1116       RADIOGRAPHIC STUDIES: No results found.  ASSESSMENT: This is a very pleasant 75 years old African-American male presented for evaluation of  persistent anemia.  His anemia is normocytic and highly suspicious for anemia of chronic disease probably because of his renal insufficiency plus/minus iron deficiency anemia.   PLAN: I had a lengthy discussion with the patient today about his current condition and further investigation to identify the etiology of his anemia. I order several studies today including repeat CBC which showed hemoglobin of 10.3 and hematocrit 31.8%.  The patient has normal serum iron and high ferritin likely reactive in nature.  He also has normal vitamin B12 level and serum folate.  Comprehensive metabolic panel is unremarkable. I will arrange for the patient to receive 2 more doses of iron infusion in the trial to improve his anemia to hemoglobin above 11.0 so the patient can have his surgery. I think he will do fine even with the current hemoglobin and if needed he could receive 1-2 units of PRBCs transfusion if needed for the surgery. I will see him back for follow-up visit in 1 months for evaluation and repeat CBC and iron study. The patient was  advised to call immediately if he has any other concerning symptoms in the interval. The patient voices understanding of current disease status and treatment options and is in agreement with the current care plan.  All questions were answered. The patient knows to call the clinic with any problems, questions or concerns. We can certainly see the patient much sooner if necessary.  Thank you so much for allowing me to participate in the care of Scott Ransom Sr.. I will continue to follow up the patient with you and assist in his care.  The total time spent in the appointment was 60 minutes.  Disclaimer: This note was dictated with voice recognition software. Similar sounding words can inadvertently be transcribed and may not be corrected upon review.   Eilleen Kempf January 13, 2021, 12:17 PM

## 2021-01-13 NOTE — Telephone Encounter (Signed)
Would advise pt to schedule f/u with Ortho as awaiting TKR surgery once hgb improves.

## 2021-01-14 LAB — PROTEIN ELECTROPHORESIS, SERUM, WITH REFLEX
A/G Ratio: 1.3 (ref 0.7–1.7)
Albumin ELP: 3.5 g/dL (ref 2.9–4.4)
Alpha-1-Globulin: 0.3 g/dL (ref 0.0–0.4)
Alpha-2-Globulin: 0.5 g/dL (ref 0.4–1.0)
Beta Globulin: 0.9 g/dL (ref 0.7–1.3)
Gamma Globulin: 1.1 g/dL (ref 0.4–1.8)
Globulin, Total: 2.7 g/dL (ref 2.2–3.9)
Total Protein ELP: 6.2 g/dL (ref 6.0–8.5)

## 2021-01-15 ENCOUNTER — Telehealth: Payer: Self-pay | Admitting: Family Medicine

## 2021-01-15 NOTE — Telephone Encounter (Signed)
Patient was calling to let Dr. Volanda Napoleon know that he went back down to taking one 50 mg of his BP medication because Dr. Volanda Napoleon took him off of his fluid pill and told him to start taking two 50 mg BP pills and since he started doing that his BP has been 160 to 180.   Patient wants to know if this is okay and if he needs to start taking his fluid pills again.  Please advise

## 2021-01-16 ENCOUNTER — Ambulatory Visit (INDEPENDENT_AMBULATORY_CARE_PROVIDER_SITE_OTHER): Payer: Medicare Other | Admitting: Family Medicine

## 2021-01-16 ENCOUNTER — Other Ambulatory Visit: Payer: Self-pay

## 2021-01-16 ENCOUNTER — Encounter: Payer: Self-pay | Admitting: Family Medicine

## 2021-01-16 VITALS — BP 150/80 | HR 67 | Temp 98.0°F

## 2021-01-16 DIAGNOSIS — I10 Essential (primary) hypertension: Secondary | ICD-10-CM | POA: Diagnosis not present

## 2021-01-16 DIAGNOSIS — M1712 Unilateral primary osteoarthritis, left knee: Secondary | ICD-10-CM | POA: Diagnosis not present

## 2021-01-16 DIAGNOSIS — D638 Anemia in other chronic diseases classified elsewhere: Secondary | ICD-10-CM

## 2021-01-16 MED ORDER — TRIAMTERENE-HCTZ 37.5-25 MG PO TABS: 1.0000 | ORAL_TABLET | Freq: Every day | ORAL | 3 refills | Status: DC

## 2021-01-16 MED ORDER — LOSARTAN POTASSIUM 50 MG PO TABS: 50.0000 mg | ORAL_TABLET | Freq: Every day | ORAL | 3 refills | Status: DC

## 2021-01-16 NOTE — Telephone Encounter (Signed)
Informed patient at appointment 01/16/21.

## 2021-01-16 NOTE — Patient Instructions (Signed)
Hyponatremia Hyponatremia is when the amount of salt (sodium) in your blood is too low. When salt levels are low, your body may take in extra water. This can cause swelling throughout the body. The swelling often affects the brain. What are the causes? This condition may be caused by:  Certain medical problems or conditions.  Vomiting a lot.  Having watery poop (diarrhea) often.  Certain medicines or illegal drugs.  Not having enough water in the body (dehydration).  Drinking too much water.  Eating a diet that is low in salt.  Large burns on your body.  Too much sweating. What increases the risk? You are more likely to get this condition if you:  Have long-term (chronic) kidney disease.  Have heart failure.  Have a medical condition that causes you to have watery poop often.  Do very hard exercises.  Take medicines that affect the amount of salt is in your blood. What are the signs or symptoms? Symptoms of this condition include:  Headache.  Feeling like you may vomit (nausea).  Vomiting.  Being very tired (lethargic).  Muscle weakness and cramps.  Not wanting to eat as much as normal (loss of appetite).  Feeling weak or light-headed. Severe symptoms of this condition include:  Confusion.  Feeling restless (agitation).  Having a fast heart rate.  Passing out (fainting).  Seizures.  Coma. How is this treated? Treatment for this condition depends on the cause. Treatment may include:  Getting fluids through an IV tube that is put into one of your veins.  Taking medicines to fix the salt levels in your blood. If medicines are causing the problem, your medicines will need to be changed.  Limiting how much water or fluid you take in.  Monitoring in the hospital to watch your symptoms.   Follow these instructions at home:  Take over-the-counter and prescription medicines only as told by your doctor. Many medicines can make this condition worse.  Talk with your doctor about any medicines that you are taking.  Eat and drink exactly as you are told by your doctor. ? Eat only the foods you are told to eat. ? Limit how much fluid you take.  Do not drink alcohol.  Keep all follow-up visits as told by your doctor. This is important.   Contact a doctor if:  You feel more like you may vomit.  You feel more tired.  Your headache gets worse.  You feel more confused.  You feel weaker.  Your symptoms go away and then they come back.  You have trouble following the diet instructions. Get help right away if:  You have a seizure.  You pass out.  You keep having watery poop.  You keep vomiting. Summary  Hyponatremia is when the amount of salt in your blood is too low.  When salt levels are low, you can have swelling throughout the body. The swelling mostly affects the brain.  Treatment depends on the cause. Treatment may include getting IV fluids, medicines, or not drinking as much fluid. This information is not intended to replace advice given to you by your health care provider. Make sure you discuss any questions you have with your health care provider. Document Revised: 01/05/2019 Document Reviewed: 09/22/2018 Elsevier Patient Education  2021 Elsevier Inc.  

## 2021-01-16 NOTE — Progress Notes (Signed)
Subjective:    Patient ID: Scott Apo., male    DOB: 04-30-1946, 75 y.o.   MRN: [id number]  No chief complaint on file.   HPI Patient was seen today for refills.  Pt called about refills yesterday and just happened to stop by clinic this am for same.  Pt notes elevation in BP since starting losartan 100 mg daily.  Previously on losartan 50 mg daily and maxzide-25 mg daily.  Maxzide d/c'd 2/2 chronic hyponatremia (127-134).  Sodium improved off med.  Pt seen by Heme/onc for anemia of chronic dz and h/o of renal insufficiency as anemia preventing him from having L TKR.  Pt notes worsened knee pain edema left foot.  HAs another iron infusion scheduled for tomorrow. Hgb was as low as 9.8 on 12/16/20, now 10.3.  Pt taking ferrous sulfate 325 mg BID.  Endorses dark stools.  Denies constipation, dizziness, palpitations, weakness.  Past Medical History:  Diagnosis Date  . ANEMIA, OTHER UNSPEC 08/02/2009  . Arthritis   . B12 DEFICIENCY 07/07/2007  . Bell's palsy     around 2005  . BPPV (benign paroxysmal positional vertigo)    2003  . Cataract   . EKG abnormalities   . GERD 07/13/2007  . Gout, unspecified 06/17/2010  . HYPERLIPIDEMIA 07/13/2007  . HYPERTENSION 07/13/2007  . RENAL INSUFFICIENCY 06/04/2010    Allergies  Allergen Reactions  . Nsaids     REACTION: hx of renal insufficiency  . Statins     myalgias    ROS General: Denies fever, chills, night sweats, changes in weight, changes in appetite HEENT: Denies headaches, ear pain, changes in vision, rhinorrhea, sore throat CV: Denies CP, palpitations, SOB, orthopnea Pulm: Denies SOB, cough, wheezing GI: Denies abdominal pain, nausea, vomiting, diarrhea, constipation GU: Denies dysuria, hematuria, frequency Msk: Denies muscle cramps, joint pains  + left knee pain and L LE edema Neuro: Denies weakness, numbness, tingling Skin: Denies rashes, bruising Psych: Denies depression, anxiety, hallucinations     Objective:    Blood  pressure (!) 150/80, pulse 67, temperature 98 F (36.7 C), temperature source Oral, SpO2 99 %. Gen. Pleasant, well-nourished, in no distress, normal affect   HEENT: [place]/AT, face symmetric, conjunctiva clear, no scleral icterus, PERRLA, EOMI, nares patent without drainage Lungs: no accessory muscle use Cardiovascular: RRR,  no peripheral edema Musculoskeletal: Left knee >> right knee in size, left knee TTP with increased warmth.  No cyanosis or clubbing, normal tone Neuro:  A&Ox3, CN II-XII intact, normal gait Skin:  Warm, no lesions/ rash   Wt Readings from Last 3 Encounters:  01/13/21 171 lb 14.4 oz (78 kg)  12/11/20 170 lb (77.1 kg)  12/04/20 165 lb 12.8 oz (75.2 kg)    Lab Results  Component Value Date   WBC 6.0 01/13/2021   HGB 10.3 (L) 01/13/2021   HCT 31.8 (L) 01/13/2021   PLT 192 01/13/2021   GLUCOSE 79 01/13/2021   CHOL 191 07/19/2020   TRIG 70 07/19/2020   HDL 48 07/19/2020   LDLDIRECT 131.0 01/24/2016   LDLCALC 127 (H) 07/19/2020   ALT 18 01/13/2021   AST 26 01/13/2021   NA 137 01/13/2021   K 3.8 01/13/2021   CL 101 01/13/2021   CREATININE 1.20 01/13/2021   BUN 8 01/13/2021   CO2 30 01/13/2021   TSH 1.93 10/10/2014   PSA 3.52 03/30/2017   INR 1.1 12/09/2020   HGBA1C 6.3 12/02/2020    Assessment/Plan:  Essential hypertension -Uncontrolled -We will restart patient previous  medications including losartan 50 mg daily and triamterene-hydrochlorothiazide 37.5-25 mg daily -We will continue to monitor for hyponatremia as previously noted on Maxide-25 -Patient encouraged to check BP at home and keep a log to bring with him to clinic -Continue lifestyle modifications - Plan: losartan (COZAAR) 50 MG tablet, triamterene-hydrochlorothiazide (MAXZIDE-25) 37.5-25 MG tablet  Primary osteoarthritis of left knee -Monitoring for improvement in hemoglobin in order to have left TKR -Continue supportive care -Continue follow-up with Ortho  Anemia of chronic  disease -Likely 2/2 renal insufficiency and iron deficiency -Continue iron supplements twice daily and MiraLAX -Patient to keep appointment for iron infusion -Continue follow-up with heme-onc as needed  F/u as needed for continued or worsening symptoms  Grier Mitts, MD

## 2021-01-16 NOTE — Telephone Encounter (Signed)
Patient came in for appointment on 01/16/21.

## 2021-01-17 ENCOUNTER — Inpatient Hospital Stay: Payer: Medicare Other

## 2021-01-17 ENCOUNTER — Telehealth: Payer: Self-pay

## 2021-01-17 NOTE — Telephone Encounter (Signed)
Notification received from [facility] that pt no showed his iron infusion appt.  I called and spoke with pts wife, Stanton Kidney, who confirmed pt did know he had the appt and where the appt was located but has no real reason why he didn't go.

## 2021-01-20 ENCOUNTER — Telehealth: Payer: Self-pay | Admitting: Family Medicine

## 2021-01-20 ENCOUNTER — Telehealth: Payer: Self-pay | Admitting: *Deleted

## 2021-01-20 NOTE — Telephone Encounter (Signed)
Received call from patient requesting to change his iron infusion appts to the morning as he picks up his grandchildren from school around 1-1:30 pm. Scheduling message sent

## 2021-01-20 NOTE — Telephone Encounter (Signed)
Note from last visit (3/17) still has losartan 50 mg, refill was sent to pharmacy he is requesting.

## 2021-01-20 NOTE — Telephone Encounter (Signed)
Pt is calling in stating that his Rx losartan (COZAAR) 50 MG stated he is out and Dr. Volanda Napoleon increased to 100 MG and he hasn't had any since Friday pt is very concerned about not have taken it.  Pharm:  [facility]  [place]

## 2021-01-20 NOTE — Telephone Encounter (Signed)
Refills were sent to [facility]'s [facility] pharmacy on 3/17 at time of OFV.  Dose was changed back to losartan 50 mg and triamterene-hctz restarted at pts request despite it previously causing chronic hyponatremia, as pt felt the losartan 100 mg was not helping.  Please verify with pharmacy, though receipt of refill noted at bottom of both meds.

## 2021-01-21 ENCOUNTER — Telehealth: Payer: Self-pay

## 2021-01-21 NOTE — Telephone Encounter (Signed)
Pt called back requesting to keep his infusion appt at [facility] on Friday 3/25. Pt states he does not have to pick up his grandchildren on that day.  I have contact scheduling regarding this.

## 2021-01-21 NOTE — Telephone Encounter (Signed)
Called pharmacy, spoke with Lanelle Bal, patient picked up 90 day supply on 11/27/20 and insurance will not allow early refill. Asked if he could get enough to last until 02/03/21, if he paid, she informed he could and it would be $12.26. Spoke with patient and advised, he stated he will go to pharmacy to get medication.

## 2021-01-21 NOTE — Telephone Encounter (Signed)
Patient is calling back and stated that he needs clarification on which dosage of the losartan he is suppose to take and also stated that when he tried to get the medication pharmacy stated that it was too early to fill, please advise. CB is [phone]

## 2021-01-23 ENCOUNTER — Other Ambulatory Visit: Payer: Self-pay | Admitting: Family Medicine

## 2021-01-23 NOTE — Telephone Encounter (Signed)
Okay to refill? 

## 2021-01-24 ENCOUNTER — Inpatient Hospital Stay: Payer: Medicare Other

## 2021-01-24 ENCOUNTER — Emergency Department (HOSPITAL_BASED_OUTPATIENT_CLINIC_OR_DEPARTMENT_OTHER): Payer: Medicare Other

## 2021-01-24 ENCOUNTER — Encounter (HOSPITAL_BASED_OUTPATIENT_CLINIC_OR_DEPARTMENT_OTHER): Payer: Self-pay

## 2021-01-24 ENCOUNTER — Other Ambulatory Visit: Payer: Self-pay | Admitting: Family

## 2021-01-24 ENCOUNTER — Other Ambulatory Visit: Payer: Self-pay

## 2021-01-24 ENCOUNTER — Emergency Department (HOSPITAL_BASED_OUTPATIENT_CLINIC_OR_DEPARTMENT_OTHER)
Admission: EM | Admit: 2021-01-24 | Discharge: 2021-01-24 | Disposition: A | Discharge status: Home / Self Care | Payer: Medicare Other | Attending: Emergency Medicine | Admitting: Emergency Medicine

## 2021-01-24 VITALS — BP 157/74 | HR 78 | Temp 98.2°F | Resp 19

## 2021-01-24 DIAGNOSIS — I129 Hypertensive chronic kidney disease with stage 1 through stage 4 chronic kidney disease, or unspecified chronic kidney disease: Secondary | ICD-10-CM | POA: Insufficient documentation

## 2021-01-24 DIAGNOSIS — R531 Weakness: Secondary | ICD-10-CM | POA: Insufficient documentation

## 2021-01-24 DIAGNOSIS — T7840XA Allergy, unspecified, initial encounter: Secondary | ICD-10-CM | POA: Diagnosis not present

## 2021-01-24 DIAGNOSIS — R55 Syncope and collapse: Secondary | ICD-10-CM | POA: Insufficient documentation

## 2021-01-24 DIAGNOSIS — D500 Iron deficiency anemia secondary to blood loss (chronic): Secondary | ICD-10-CM

## 2021-01-24 DIAGNOSIS — R4182 Altered mental status, unspecified: Secondary | ICD-10-CM | POA: Diagnosis not present

## 2021-01-24 DIAGNOSIS — N183 Chronic kidney disease, stage 3 unspecified: Secondary | ICD-10-CM | POA: Diagnosis not present

## 2021-01-24 DIAGNOSIS — R4781 Slurred speech: Secondary | ICD-10-CM | POA: Diagnosis not present

## 2021-01-24 DIAGNOSIS — Z79899 Other long term (current) drug therapy: Secondary | ICD-10-CM | POA: Insufficient documentation

## 2021-01-24 LAB — COMPREHENSIVE METABOLIC PANEL
ALT: 20 U/L (ref 0–44)
AST: 36 U/L (ref 15–41)
Albumin: 3.8 g/dL (ref 3.5–5.0)
Alkaline Phosphatase: 116 U/L (ref 38–126)
Anion gap: 9 (ref 5–15)
BUN: 18 mg/dL (ref 8–23)
CO2: 28 mmol/L (ref 22–32)
Calcium: 9.1 mg/dL (ref 8.9–10.3)
Chloride: 96 mmol/L — ABNORMAL LOW (ref 98–111)
Creatinine, Ser: 1.2 mg/dL (ref 0.61–1.24)
GFR, Estimated: >60 mL/min (ref >60)
Glucose, Bld: 118 mg/dL — ABNORMAL HIGH (ref 70–99)
Potassium: 3.2 mmol/L — ABNORMAL LOW (ref 3.5–5.1)
Sodium: 133 mmol/L — ABNORMAL LOW (ref 135–145)
Total Bilirubin: 0.5 mg/dL (ref 0.3–1.2)
Total Protein: 7.2 g/dL (ref 6.5–8.1)

## 2021-01-24 LAB — CBC WITH DIFFERENTIAL/PLATELET
Abs Immature Granulocytes: 0.13 10*3/uL — ABNORMAL HIGH (ref 0.00–0.07)
Basophils Absolute: 0.1 10*3/uL (ref 0.0–0.1)
Basophils Relative: 1 %
Eosinophils Absolute: 0 10*3/uL (ref 0.0–0.5)
Eosinophils Relative: 1 %
HCT: 40 % (ref 39.0–52.0)
Hemoglobin: 12.9 g/dL — ABNORMAL LOW (ref 13.0–17.0)
Immature Granulocytes: 2 %
Lymphocytes Relative: 12 %
Lymphs Abs: 0.8 10*3/uL (ref 0.7–4.0)
MCH: 28 pg (ref 26.0–34.0)
MCHC: 32.3 g/dL (ref 30.0–36.0)
MCV: 87 fL (ref 80.0–100.0)
Monocytes Absolute: 0 10*3/uL — ABNORMAL LOW (ref 0.1–1.0)
Monocytes Relative: 1 %
Neutro Abs: 5.1 10*3/uL (ref 1.7–7.7)
Neutrophils Relative %: 83 %
Platelets: 156 10*3/uL (ref 150–400)
RBC: 4.6 MIL/uL (ref 4.22–5.81)
RDW: 15.3 % (ref 11.5–15.5)
Smear Review: NORMAL
WBC Morphology: INCREASED
WBC: 6.1 10*3/uL (ref 4.0–10.5)
nRBC: 0 % (ref 0.0–0.2)

## 2021-01-24 LAB — URINALYSIS, ROUTINE W REFLEX MICROSCOPIC
Bilirubin Urine: NEGATIVE
Glucose, UA: NEGATIVE mg/dL
Hgb urine dipstick: NEGATIVE
Ketones, ur: NEGATIVE mg/dL
Leukocytes,Ua: NEGATIVE
Nitrite: NEGATIVE
Protein, ur: NEGATIVE mg/dL
Specific Gravity, Urine: 1.02 (ref 1.005–1.030)
pH: 7.5 (ref 5.0–8.0)

## 2021-01-24 LAB — LACTIC ACID, PLASMA
Lactic Acid, Venous: 2.2 mmol/L (ref 0.5–1.9)
Lactic Acid, Venous: 1.3 mmol/L (ref 0.5–1.9)

## 2021-01-24 LAB — CBG MONITORING, ED: Glucose-Capillary: 94 mg/dL (ref 70–99)

## 2021-01-24 LAB — TROPONIN I (HIGH SENSITIVITY)
Troponin I (High Sensitivity): 8 ng/L (ref <18)
Troponin I (High Sensitivity): 11 ng/L (ref <18)

## 2021-01-24 LAB — AMMONIA: Ammonia: 24 umol/L (ref 9–35)

## 2021-01-24 MED ORDER — MEPERIDINE HCL 25 MG/ML IJ SOLN
INTRAMUSCULAR | Status: AC
  Filled 2021-01-24: qty 1

## 2021-01-24 MED ORDER — MEPERIDINE HCL 25 MG/ML IJ SOLN
25.0000 mg | Freq: Once | INTRAMUSCULAR | Status: AC
  Administered 2021-01-24: 12.5 mg via INTRAVENOUS

## 2021-01-24 MED ORDER — SODIUM CHLORIDE 0.9 % IV SOLN
Freq: Once | INTRAVENOUS | Status: DC | PRN
  Filled 2021-01-24: qty 250

## 2021-01-24 MED ORDER — PREDNISONE 20 MG PO TABS: 40.0000 mg | ORAL_TABLET | Freq: Every day | ORAL | 0 refills | Status: DC

## 2021-01-24 MED ORDER — METHYLPREDNISOLONE SODIUM SUCC 125 MG IJ SOLR
125.0000 mg | Freq: Once | INTRAMUSCULAR | Status: AC | PRN
  Administered 2021-01-24: 125 mg via INTRAVENOUS

## 2021-01-24 MED ORDER — SODIUM CHLORIDE 0.9 % IV SOLN
Freq: Once | INTRAVENOUS | Status: AC
  Filled 2021-01-24: qty 250

## 2021-01-24 MED ORDER — FAMOTIDINE IN NACL 20-0.9 MG/50ML-% IV SOLN
20.0000 mg | Freq: Once | INTRAVENOUS | Status: AC | PRN
  Administered 2021-01-24: 20 mg via INTRAVENOUS

## 2021-01-24 MED ORDER — DIPHENHYDRAMINE HCL 50 MG/ML IJ SOLN
50.0000 mg | Freq: Once | INTRAMUSCULAR | Status: AC | PRN
  Administered 2021-01-24: 25 mg via INTRAVENOUS

## 2021-01-24 MED ORDER — SODIUM CHLORIDE 0.9 % IV SOLN
510.0000 mg | Freq: Once | INTRAVENOUS | Status: AC
  Administered 2021-01-24: 510 mg via INTRAVENOUS
  Filled 2021-01-24: qty 17

## 2021-01-24 MED ORDER — LACTATED RINGERS IV BOLUS
1000.0000 mL | Freq: Once | INTRAVENOUS | Status: AC
  Administered 2021-01-24: 1000 mL via INTRAVENOUS

## 2021-01-24 NOTE — ED Notes (Signed)
Pt awaiting daughter to pick up for discharge and she will need to speak to doctor regarding discharge instructions per patient request.

## 2021-01-24 NOTE — ED Provider Notes (Signed)
[facility] EMERGENCY DEPARTMENT Provider Note   CSN: [id number] Arrival date & time: 01/24/21  1454     History Chief Complaint  Patient presents with  . Loss of Consciousness    Scott [name]. is a 75 y.o. male.  Level 5 caveat secondary to altered mental status.  Patient was reportedly getting an iron infusion today in the cancer center when he had some type of reaction.  Reportedly went unresponsive with low blood pressure.  Reportedly had some weakness unclear if focal and some slurred speech upon awakening.  Patient was placed on oxygen given fluids Solu-Medrol Pepcid Demerol and Benadryl.  Currently he is laying in bed.  Will shake his head to questions.  Denies pain.  The history is provided by the patient and a caregiver. The history is limited by the condition of the patient.  Altered Mental Status Presenting symptoms: lethargy and unresponsiveness   Severity:  Severe Most recent episode:  Today Episode history:  Single Progression:  Partially resolved Chronicity:  New Associated symptoms: slurred speech   Associated symptoms: no abdominal pain, no headaches and no vomiting        Past Medical History:  Diagnosis Date  . ANEMIA, OTHER UNSPEC 08/02/2009  . Arthritis   . B12 DEFICIENCY 07/07/2007  . Bell's palsy     around 2005  . BPPV (benign paroxysmal positional vertigo)    2003  . Cataract   . EKG abnormalities   . GERD 07/13/2007  . Gout, unspecified 06/17/2010  . HYPERLIPIDEMIA 07/13/2007  . HYPERTENSION 07/13/2007  . RENAL INSUFFICIENCY 06/04/2010    Patient Active Problem List   Diagnosis Date Noted  . Primary osteoarthritis of left knee 12/03/2020  . Erectile dysfunction 04/24/2020  . S/P carpal tunnel release 04/24/2020  . Low back pain 10/22/2014  . Rotator cuff tendinitis 06/04/2014  . Gout 06/17/2010  . CKD (chronic kidney disease) stage 3, GFR 30-59 ml/min (HCC) 06/04/2010  . Anemia 08/02/2009  . Hyperlipidemia 07/13/2007  .  Essential hypertension 07/13/2007  . GERD 07/13/2007  . B12 deficiency 07/07/2007    Past Surgical History:  Procedure Laterality Date  . BACK SURGERY  07/13/07  . CARPAL TUNNEL RELEASE Bilateral   . CATARACT EXTRACTION, BILATERAL Bilateral        Family History  Problem Relation Age of Onset  . Arthritis Father     Social History   Tobacco Use  . Smoking status: Never Smoker  . Smokeless tobacco: Never Used  Vaping Use  . Vaping Use: Never used  Substance Use Topics  . Alcohol use: No  . Drug use: No    Home Medications Prior to Admission medications   Medication Sig Start Date End Date Taking? Authorizing Provider  allopurinol (ZYLOPRIM) 300 MG tablet TAKE 1 TABLET BY MOUTH EVERY DAY 07/16/20   [name], MD  celecoxib (CELEBREX) 100 MG capsule TAKE 1 CAPSULE (100 MG TOTAL) BY MOUTH 2 (TWO) TIMES DAILY AS NEEDED FOR MODERATE PAIN (SHOULDER). 11/08/20   [name], MD  colchicine 0.6 MG tablet TAKE 1 TABLET BY MOUTH EVERY DAY 09/03/20   [name], MD  Cyanocobalamin 1000 MCG SUBL Place 1 tablet (1,000 mcg total) under the tongue once a week. 02/01/19   [name], MD  fluticasone (FLONASE) 50 MCG/ACT nasal spray SPRAY 2 SPRAYS INTO EACH NOSTRIL EVERY DAY 01/24/20   [name], MD  losartan (COZAAR) 50 MG tablet Take 1 tablet (50 mg total) by  mouth daily. 01/16/21   [name], MD  Polyethylene Glycol 3350 POWD Take 17 g by mouth daily.    [provider]  sildenafil (VIAGRA) 50 MG tablet Take one half tab (25 mg) daily as needed 1 hour prior to sexual activity. 04/24/20   [name], MD  traMADol (ULTRAM) 50 MG tablet TAKE 2 TABLETS BY MOUTH (TWO) TIMES DAILY. 01/24/21   [name], MD  triamterene-hydrochlorothiazide (MAXZIDE-25) 37.5-25 MG tablet Take 1 tablet by mouth daily. 01/16/21   [name], MD    Allergies    Nsaids and Statins  Review of Systems   Review of Systems  Unable to perform ROS: Mental status  change  Gastrointestinal: Negative for abdominal pain and vomiting.  Neurological: Negative for headaches.    Physical Exam Updated Vital Signs BP (!) 174/77 (BP Location: Right Arm)   Pulse 88   Temp 97.7 F (36.5 C) (Oral)   Resp 18   Ht 5\' 10"  (1.778 m)   Wt 80.7 kg   SpO2 100%   BMI 25.54 kg/m   Physical Exam Vitals and nursing note reviewed.  Constitutional:      Appearance: Normal appearance. He is well-developed.  HENT:     Head: Normocephalic and atraumatic.  Eyes:     Conjunctiva/sclera: Conjunctivae normal.  Cardiovascular:     Rate and Rhythm: Normal rate and regular rhythm.     Heart sounds: No murmur heard.   Pulmonary:     Effort: Pulmonary effort is normal. No respiratory distress.     Breath sounds: Normal breath sounds.  Abdominal:     Palpations: Abdomen is soft.     Tenderness: There is no abdominal tenderness.  Musculoskeletal:        General: No deformity or signs of injury. Normal range of motion.     Cervical back: Neck supple.  Skin:    General: Skin is warm and dry.     Capillary Refill: Capillary refill takes less than 2 seconds.     Findings: No erythema.  Neurological:     General: No focal deficit present.     Mental Status: He is lethargic.     Comments: Patient is arousable to voice he will nod or say yes or no.  He will not follow in our exam and makes no attempt to move his arms or his legs.  No obvious facial droop or slurred speech.     ED Results / Procedures / Treatments   Labs (all labs ordered are listed, but only abnormal results are displayed) Labs Reviewed  CBC WITH DIFFERENTIAL/PLATELET - Abnormal; Notable for the following components:      Result Value   Hemoglobin 12.9 (*)    Monocytes Absolute 0.0 (*)    Abs Immature Granulocytes 0.13 (*)    All other components within normal limits  COMPREHENSIVE METABOLIC PANEL - Abnormal; Notable for the following components:   Sodium 133 (*)    Potassium 3.2 (*)     Chloride 96 (*)    Glucose, Bld 118 (*)    All other components within normal limits  URINALYSIS, ROUTINE W REFLEX MICROSCOPIC - Abnormal; Notable for the following components:   Color, Urine STRAW (*)    All other components within normal limits  LACTIC ACID, PLASMA - Abnormal; Notable for the following components:   Lactic Acid, Venous 2.2 (*)    All other components within normal limits  AMMONIA  LACTIC ACID, PLASMA  CBG  MONITORING, ED  TROPONIN I (HIGH SENSITIVITY)  TROPONIN I (HIGH SENSITIVITY)    EKG EKG Interpretation  Date/Time:  Friday January 24 2021 15:10:02 EDT Ventricular Rate:  90 PR Interval:    QRS Duration: 82 QT Interval:  363 QTC Calculation: 445 R Axis:   84 Text Interpretation: Sinus rhythm Borderline right axis deviation LVH with secondary repolarization abnormality Probable lateral infarct, age indeterminate Anterior Q waves, possibly due to LVH Minimal ST elevation, lateral leads similar to prior today Confirmed by Aletta Edouard [phone]) on 01/24/2021 3:58:15 PM   Radiology CT Head Wo Contrast  Result Date: 01/24/2021 CLINICAL DATA:  Mental status change EXAM: CT HEAD WITHOUT CONTRAST TECHNIQUE: Contiguous axial images were obtained from the base of the skull through the vertex without intravenous contrast. Sagittal and coronal MPR images reconstructed from axial data set. COMPARISON:  None. FINDINGS: Brain: Generalized atrophy. Normal ventricular morphology. No midline shift or mass effect. Small vessel chronic ischemic changes of deep cerebral white matter. No intracranial hemorrhage, mass lesion, evidence of acute infarction, or extra-axial fluid collection. Vascular: Atherosclerotic calcifications of internal carotid arteries at the skull base. No hyperdense vessels Skull: Calvaria intact. Small subgaleal lipoma in the frontal region. Sinuses/Orbits: Clear Other: N/A IMPRESSION: No acute intracranial abnormalities. Mild atrophy and small vessel chronic  ischemic changes of deep cerebral white matter. Electronically Signed   By: Lavonia Dana M.D.   On: 01/24/2021 16:27   DG Chest Port 1 View  Result Date: 01/24/2021 CLINICAL DATA:  Weakness. EXAM: PORTABLE CHEST 1 VIEW COMPARISON:  03/31/2006 FINDINGS: The cardiomediastinal contours are normal. The lungs are clear. Pulmonary vasculature is normal. No consolidation, pleural effusion, or pneumothorax. No acute osseous abnormalities are seen. Bilateral chronic shoulder arthropathy. IMPRESSION: No acute chest findings. Electronically Signed   By: Keith Rake M.D.   On: 01/24/2021 15:53    Procedures Procedures   Medications Ordered in ED Medications  lactated ringers bolus 1,000 mL (0 mLs Intravenous Stopped 01/24/21 1801)    ED Course  I have reviewed the triage vital signs and the nursing notes.  Pertinent labs & imaging results that were available during my care of the patient were reviewed by me and considered in my medical decision making (see chart for details).  Clinical Course as of 01/25/21 1031  Fri Jan 24, 2021  1532 Reassessment-patient is more alert.  Asking for his phone so he can reach his wife. [MB]  1600 Chest x-ray interpreted by me as no acute findings.  Awaiting radiology reading. [MB]  8527 Reassessed, patient now is awake and alert.  His only complaint is his chronic left knee pain which he is hoping to get surgery on once he has a better hemoglobin.  He is asking when he can be discharged. [MB]  7824 Patient has been up and ambulatory without any difficulty except for his chronic knee pain.  He feels back to baseline.  He is asked to eat some food.  We will discharge him home. [MB]    Clinical Course User Index [MB] Hayden Rasmussen, MD   MDM Rules/Calculators/A&P                         This patient complains of unresponsive episode possibly syncope allergic reaction; this involves an extensive number of treatment Options and is a complaint that carries with  it a high risk of complications and Morbidity. The differential includes anaphylaxis, allergic reaction, syncope, stroke, metabolic derangement, arrhythmia  I ordered, reviewed and interpreted labs, which included CBC with white count normal, chemistries fairly normal other than low potassium, urinalysis negative, troponin flat, lactic acid normal I ordered medication IV fluids I ordered imaging studies which included chest x-ray and head CT and I independently    visualized and interpreted imaging which showed no acute findings Previous records obtained and reviewed in epic  After the interventions stated above, I reevaluated the patient and found patient to be asymptomatic now.  His vital signs remained stable.  His mental status is completely cleared.  I discussed his work-up with patient follow-up with her treating physicians.  Return instructions discussed.   Final Clinical Impression(s) / ED Diagnoses Final diagnoses:  Allergic reaction, initial encounter  Syncope and collapse    Rx / DC Orders ED Discharge Orders    None       Hayden Rasmussen, MD 01/25/21 1034

## 2021-01-24 NOTE — ED Notes (Signed)
Pt more alert, answers questions appropriately. Reports pain left knee (chronic)

## 2021-01-24 NOTE — ED Notes (Signed)
Pt ambulated in room with crutch, reports at baseline.  Dr Melina Copa approved for discharge.

## 2021-01-24 NOTE — Progress Notes (Signed)
Patient in for [facility] infusion today, started at 1352 and at 1400 patient complained of not feeling well and hands started shaking and requested water. Stopped iron infusion at 1400, started normal saline.  1402, patient unresponsive, placed in trendelenburg position, Dr.Ennever and Sarah Cinncinati,NP by bedside. 1405 BP 70/36 HR 84, O2 99% O2 started wide open 1407 BP 67/40  HR 75 O2 84%, diaphoretic and clammy 1410 Bp 565/44 HR 79 o2 95% 1411 BP 76/43 HR 68 O2 99%  1412 Patient asked to sit up due to leg pain  1413 BP 93/41 HR 68 o2 100% 1414 patient complained of tightness in throat.  125mg  IV solumedrol 25mg  IV benadryl  1416 Pepcid 20 mg Iv given Bp 100/40 HR 79 o2 98% RR 19  1420 patient complained of chest pain EKG performed _ normal sinus rhythm  1423: BP 140/76 HR 77 o2 96% RR 19, patient denies any concerns  1427: 147/90 HR 79 o2 98%  1442: Patient complained of chills and shaking, Dr.Ennever back to bedside order for 12.5mg  demerol IV given. BP 159/33 HR 80 O2 100%  1446: orders to take patient to ED. PIV still in place to right [facility]  1452: report called to charge RN in ED, patient taken down via wheelchair. Wife aware patient is in ED.

## 2021-01-24 NOTE — ED Triage Notes (Signed)
Pt arrives from cancer center upstairs where he was getting an iron infusion today. RNs that brought him to ED report that patient had had reaction to iron infusion in the past, state that he went unresponsive today with soft BP and some weakness and slurred speech upon waking. Pt placed on 2L [place] prior to coming to ED as well as being given some fluids, 125 solumedrol, 20 Pepcid, demerol (unknown amount) and 25 benadryl.

## 2021-01-24 NOTE — ED Notes (Signed)
ED Provider at bedside. 

## 2021-01-24 NOTE — Patient Instructions (Signed)
Ferumoxytol injection What is this medicine? FERUMOXYTOL is an iron complex. Iron is used to make healthy red blood cells, which carry oxygen and nutrients throughout the body. This medicine is used to treat iron deficiency anemia. This medicine may be used for other purposes; ask your health care provider or pharmacist if you have questions. COMMON BRAND NAME(S): Feraheme What should I tell my health care provider before I take this medicine? They need to know if you have any of these conditions:  anemia not caused by low iron levels  high levels of iron in the blood  magnetic resonance imaging (MRI) test scheduled  an unusual or allergic reaction to iron, other medicines, foods, dyes, or preservatives  pregnant or trying to get pregnant  breast-feeding How should I use this medicine? This medicine is for injection into a vein. It is given by a health care professional in a hospital or clinic setting. Talk to your pediatrician regarding the use of this medicine in children. Special care may be needed. Overdosage: If you think you have taken too much of this medicine contact a poison control center or emergency room at once. NOTE: This medicine is only for you. Do not share this medicine with others. What if I miss a dose? It is important not to miss your dose. Call your doctor or health care professional if you are unable to keep an appointment. What may interact with this medicine? This medicine may interact with the following medications:  other iron products This list may not describe all possible interactions. Give your health care provider a list of all the medicines, herbs, non-prescription drugs, or dietary supplements you use. Also tell them if you smoke, drink alcohol, or use illegal drugs. Some items may interact with your medicine. What should I watch for while using this medicine? Visit your doctor or healthcare professional regularly. Tell your doctor or healthcare  professional if your symptoms do not start to get better or if they get worse. You may need blood work done while you are taking this medicine. You may need to follow a special diet. Talk to your doctor. Foods that contain iron include: whole grains/cereals, dried fruits, beans, or peas, leafy green vegetables, and organ meats (liver, kidney). What side effects may I notice from receiving this medicine? Side effects that you should report to your doctor or health care professional as soon as possible:  allergic reactions like skin rash, itching or hives, swelling of the face, lips, or tongue  breathing problems  changes in blood pressure  feeling faint or lightheaded, falls  fever or chills  flushing, sweating, or hot feelings  swelling of the ankles or feet Side effects that usually do not require medical attention (report to your doctor or health care professional if they continue or are bothersome):  diarrhea  headache  nausea, vomiting  stomach pain This list may not describe all possible side effects. Call your doctor for medical advice about side effects. You may report side effects to FDA at 1-800-FDA-1088. Where should I keep my medicine? This drug is given in a hospital or clinic and will not be stored at home. NOTE: This sheet is a summary. It may not cover all possible information. If you have questions about this medicine, talk to your doctor, pharmacist, or health care provider.  2021 Elsevier/Gold Standard (2016-12-07 20:21:10)  

## 2021-01-24 NOTE — Discharge Instructions (Addendum)
You were seen in the emergency department for an allergic reaction while you were getting an iron infusion.  Your symptoms improved here and you felt back to baseline.  We are putting you on a few days of steroids.  Please make an appointment to follow-up with your regular doctor.  Return to the emergency department if any worsening or concerning symptoms

## 2021-01-24 NOTE — Progress Notes (Signed)
Patient reacted within 8 minutes of starting Feraheme. This was is second dose (first on 12/24/20). Patient states that he had no reaction with first infusion.  Initially he became diaphoretic, dizzy and then briefly unresponsive. After several seconds, he was able to answer questions appropriately. Madilyn Hook was immediately stopped and he was treated with IV fluids, benadryl, pepcid, solumedrol and 2L [place]. EKG showed NSR. His vital signs did stabilize but patient continued to have chills, tightness in throat, lethargy and rigors.  Patient given Demorol and later sent to ED for further eval and treatment. Please refer to infusion note for detailed description of encounter.

## 2021-01-28 ENCOUNTER — Other Ambulatory Visit: Payer: Self-pay

## 2021-01-28 ENCOUNTER — Other Ambulatory Visit: Payer: Self-pay | Admitting: Hematology & Oncology

## 2021-01-28 ENCOUNTER — Ambulatory Visit (HOSPITAL_COMMUNITY): Payer: Medicare Other | Attending: Internal Medicine

## 2021-01-28 DIAGNOSIS — R9431 Abnormal electrocardiogram [ECG] [EKG]: Secondary | ICD-10-CM | POA: Insufficient documentation

## 2021-01-28 LAB — ECHOCARDIOGRAM COMPLETE
Area-P 1/2: 4.39 cm2
S' Lateral: 3.1 cm

## 2021-01-29 ENCOUNTER — Encounter: Payer: Self-pay | Admitting: Family Medicine

## 2021-01-29 ENCOUNTER — Ambulatory Visit (INDEPENDENT_AMBULATORY_CARE_PROVIDER_SITE_OTHER): Payer: Medicare Other | Admitting: Family Medicine

## 2021-01-29 VITALS — BP 138/80 | HR 81 | Temp 97.7°F

## 2021-01-29 DIAGNOSIS — D631 Anemia in chronic kidney disease: Secondary | ICD-10-CM | POA: Diagnosis not present

## 2021-01-29 DIAGNOSIS — N1831 Chronic kidney disease, stage 3a: Secondary | ICD-10-CM

## 2021-01-29 DIAGNOSIS — I10 Essential (primary) hypertension: Secondary | ICD-10-CM | POA: Diagnosis not present

## 2021-01-29 DIAGNOSIS — T7840XD Allergy, unspecified, subsequent encounter: Secondary | ICD-10-CM

## 2021-01-29 DIAGNOSIS — E876 Hypokalemia: Secondary | ICD-10-CM

## 2021-01-29 DIAGNOSIS — M1712 Unilateral primary osteoarthritis, left knee: Secondary | ICD-10-CM | POA: Diagnosis not present

## 2021-01-29 LAB — CBC WITH DIFFERENTIAL/PLATELET
Basophils Absolute: 0 10*3/uL (ref 0.0–0.1)
Basophils Relative: 0.2 % (ref 0.0–3.0)
Eosinophils Absolute: 0.1 10*3/uL (ref 0.0–0.7)
Eosinophils Relative: 0.7 % (ref 0.0–5.0)
HCT: 37.5 % — ABNORMAL LOW (ref 39.0–52.0)
Hemoglobin: 12.4 g/dL — ABNORMAL LOW (ref 13.0–17.0)
Lymphocytes Relative: 17 % (ref 12.0–46.0)
Lymphs Abs: 1.2 10*3/uL (ref 0.7–4.0)
MCHC: 33.1 g/dL (ref 30.0–36.0)
MCV: 85.9 fl (ref 78.0–100.0)
Monocytes Absolute: 0.8 10*3/uL (ref 0.1–1.0)
Monocytes Relative: 10.7 % (ref 3.0–12.0)
Neutro Abs: 5 10*3/uL (ref 1.4–7.7)
Neutrophils Relative %: 71.4 % (ref 43.0–77.0)
Platelets: 196 10*3/uL (ref 150.0–400.0)
RBC: 4.37 Mil/uL (ref 4.22–5.81)
RDW: 16.1 % — ABNORMAL HIGH (ref 11.5–15.5)
WBC: 7.1 10*3/uL (ref 4.0–10.5)

## 2021-01-29 LAB — BASIC METABOLIC PANEL
BUN: 18 mg/dL (ref 6–23)
CO2: 32 mEq/L (ref 19–32)
Calcium: 9.7 mg/dL (ref 8.4–10.5)
Chloride: 96 mEq/L (ref 96–112)
Creatinine, Ser: 1.3 mg/dL (ref 0.40–1.50)
GFR: 53.14 mL/min — ABNORMAL LOW (ref >60.00)
Glucose, Bld: 93 mg/dL (ref 70–99)
Potassium: 4.2 mEq/L (ref 3.5–5.1)
Sodium: 134 mEq/L — ABNORMAL LOW (ref 135–145)

## 2021-01-29 NOTE — Progress Notes (Signed)
Subjective:    Patient ID: Scott Apo., male    DOB: Jan 16, 1946, 75 y.o.   MRN: [id number]  Chief Complaint  Patient presents with  . Follow-up    HPI Patient is a 75 yo male with pmh sig for HTN, HLD, OA L knee, GERD, was seen today for f/u.  Pt seen at [facility] ED on 01/24/21 for syncope and collapse s/p ferraheme infusion.  Pt became hypotensive (BP 67/40, HR 75, pO2 84%) 8 minutes after start of infusion.  Pt given Solu-Medrol, Pepcid, Demerol, Benadryl.   EKG with old infarct, CXR nml, and CT head w/o acute abnormalities.     Pt states he is doing well since the incident.  Pt taking iron daily.  Denies SOB, CP, HA, n/v.  Pt with continued knee pain, hopefull he can have surgery soon.  States hgb went up.   Past Medical History:  Diagnosis Date  . ANEMIA, OTHER UNSPEC 08/02/2009  . Arthritis   . B12 DEFICIENCY 07/07/2007  . Bell's palsy     around 2005  . BPPV (benign paroxysmal positional vertigo)    2003  . Cataract   . EKG abnormalities   . GERD 07/13/2007  . Gout, unspecified 06/17/2010  . HYPERLIPIDEMIA 07/13/2007  . HYPERTENSION 07/13/2007  . RENAL INSUFFICIENCY 06/04/2010    Allergies  Allergen Reactions  . Nsaids     REACTION: hx of renal insufficiency  . Statins     myalgias    ROS General: Denies fever, chills, night sweats, changes in weight, changes in appetite HEENT: Denies headaches, ear pain, changes in vision, rhinorrhea, sore throat CV: Denies CP, palpitations, SOB, orthopnea Pulm: Denies SOB, cough, wheezing GI: Denies abdominal pain, nausea, vomiting, diarrhea, constipation GU: Denies dysuria, hematuria, frequency Msk: Denies muscle cramps  +L knee pain and edema Neuro: Denies weakness, numbness, tingling Skin: Denies rashes, bruising Psych: Denies depression, anxiety, hallucinations     Objective:    Blood pressure 138/80, pulse 81, temperature 97.7 F (36.5 C), temperature source Oral, SpO2 96 %.  Gen. Pleasant, well-nourished, in no  distress, normal affect   HEENT: [place]/AT, face symmetric, conjunctiva clear, no scleral icterus, PERRLA, EOMI, nares patent without drainage Lungs: no accessory muscle use, CTAB, no wheezes or rales Cardiovascular: RRR, no m/r/g, no peripheral edema Musculoskeletal: L knee edema and TTP.  Genu varum deformity of LLE. no cyanosis or clubbing, normal tone Neuro:  A&Ox3, CN II-XII intact, ambulating with crutch, slowed gait. Skin:  Warm, no lesions/ rash.  Large lipoma on left upper back  Wt Readings from Last 3 Encounters:  01/24/21 178 lb (80.7 kg)  01/13/21 171 lb 14.4 oz (78 kg)  12/11/20 170 lb (77.1 kg)    Lab Results  Component Value Date   WBC 6.1 01/24/2021   HGB 12.9 (L) 01/24/2021   HCT 40.0 01/24/2021   PLT 156 01/24/2021   GLUCOSE 118 (H) 01/24/2021   CHOL 191 07/19/2020   TRIG 70 07/19/2020   HDL 48 07/19/2020   LDLDIRECT 131.0 01/24/2016   LDLCALC 127 (H) 07/19/2020   ALT 20 01/24/2021   AST 36 01/24/2021   NA 133 (L) 01/24/2021   K 3.2 (L) 01/24/2021   CL 96 (L) 01/24/2021   CREATININE 1.20 01/24/2021   BUN 18 01/24/2021   CO2 28 01/24/2021   TSH 1.93 10/10/2014   PSA 3.52 03/30/2017   INR 1.1 12/09/2020   HGBA1C 6.3 12/02/2020    Assessment/Plan:  Anemia due to stage 3a  chronic kidney disease (HCC)  -Stable -Hemoglobin 12.9 on 01/24/2021, likely 2/2 infusion. -We will recheck.  If hemoglobin happens improved patient to contact Ortho in regards to scheduling procedure. - Plan: Basic metabolic panel, CBC with Differential/Platelet  Hypokalemia   -Potassium 3.2 on 01/24/21 -We will recheck.  Replete if needed - Plan: Basic metabolic panel  Allergic reaction to drug, subsequent encounter -SOB, hypotension syncope noted after second Feraheme infusion.  Primary osteoarthritis of left knee -Continue supportive care -Continue using a crutch to assist with ambulation -Follow-up with Ortho once hemoglobin improved for left TKR  Essential  hypertension -improving -continue current meds Triamterene-hydrochlorothiazide 37.5-25 mg daily, losartan 50 mg. -Continue to monitor for hyponatremia as noted previously with triamterene.  F/u prn  Grier Mitts, MD

## 2021-01-29 NOTE — Patient Instructions (Signed)
[employer] (25th ed., pp. [phone]). [place], [place]: Elsevier.">  Anemia  Anemia is a condition in which there is not enough red blood cells or hemoglobin in the blood. Hemoglobin is a substance in red blood cells that carries oxygen. When you do not have enough red blood cells or hemoglobin (are anemic), your body cannot get enough oxygen and your organs may not work properly. As a result, you may feel very tired or have other problems. What are the causes? Common causes of anemia include:  Excessive bleeding. Anemia can be caused by excessive bleeding inside or outside the body, including bleeding from the intestines or from heavy menstrual periods in females.  Poor nutrition.  Long-lasting (chronic) kidney, thyroid, and liver disease.  Bone marrow disorders, spleen problems, and blood disorders.  Cancer and treatments for cancer.  HIV (human immunodeficiency virus) and AIDS (acquired immunodeficiency syndrome).  Infections, medicines, and autoimmune disorders that destroy red blood cells. What are the signs or symptoms? Symptoms of this condition include:  Minor weakness.  Dizziness.  Headache, or difficulties concentrating and sleeping.  Heartbeats that feel irregular or faster than normal (palpitations).  Shortness of breath, especially with exercise.  Pale skin, lips, and nails, or cold hands and feet.  Indigestion and nausea. Symptoms may occur suddenly or develop slowly. If your anemia is mild, you may not have symptoms. How is this diagnosed? This condition is diagnosed based on blood tests, your medical history, and a physical exam. In some cases, a test may be needed in which cells are removed from the soft tissue inside of a bone and looked at under a microscope (bone marrow biopsy). Your health care provider may also check your stool (feces) for blood and may do additional testing to look for the cause of your bleeding. Other tests may  include:  Imaging tests, such as a CT scan or MRI.  A procedure to see inside your esophagus and stomach (endoscopy).  A procedure to see inside your colon and rectum (colonoscopy). How is this treated? Treatment for this condition depends on the cause. If you continue to lose a lot of blood, you may need to be treated at a hospital. Treatment may include:  Taking supplements of iron, vitamin Q68, or folic acid.  Taking a hormone medicine (erythropoietin) that can help to stimulate red blood cell growth.  Having a blood transfusion. This may be needed if you lose a lot of blood.  Making changes to your diet.  Having surgery to remove your spleen. Follow these instructions at home:  Take over-the-counter and prescription medicines only as told by your health care provider.  Take supplements only as told by your health care provider.  Follow any diet instructions that you were given by your health care provider.  Keep all follow-up visits as told by your health care provider. This is important. Contact a health care provider if:  You develop new bleeding anywhere in the body. Get help right away if:  You are very weak.  You are short of breath.  You have pain in your abdomen or chest.  You are dizzy or feel faint.  You have trouble concentrating.  You have bloody stools, black stools, or tarry stools.  You vomit repeatedly or you vomit up blood. These symptoms may represent a serious problem that is an emergency. Do not wait to see if the symptoms will go away. Get medical help right away. Call your local emergency services (911 in the [place] Do not  drive yourself to the hospital. Summary  Anemia is a condition in which you do not have enough red blood cells or enough of a substance in your red blood cells that carries oxygen (hemoglobin).  Symptoms may occur suddenly or develop slowly.  If your anemia is mild, you may not have symptoms.  This condition is  diagnosed with blood tests, a medical history, and a physical exam. Other tests may be needed.  Treatment for this condition depends on the cause of the anemia. This information is not intended to replace advice given to you by your health care provider. Make sure you discuss any questions you have with your health care provider. Document Revised: 09/26/2019 Document Reviewed: 09/26/2019 Elsevier Patient Education  2021 [employer].  Hypokalemia Hypokalemia means that the amount of potassium in the blood is lower than normal. Potassium is a chemical (electrolyte) that helps regulate the amount of fluid in the body. It also stimulates muscle tightening (contraction) and helps nerves work properly. Normally, most of the body's potassium is inside cells, and only a very small amount is in the blood. Because the amount in the blood is so small, minor changes to potassium levels in the blood can be life-threatening. What are the causes? This condition may be caused by:  Antibiotic medicine.  Diarrhea or vomiting. Taking too much of a medicine that helps you have a bowel movement (laxative) can cause diarrhea and lead to hypokalemia.  Chronic kidney disease (CKD).  Medicines that help the body get rid of excess fluid (diuretics).  Eating disorders, such as bulimia.  Low magnesium levels in the body.  Sweating a lot. What are the signs or symptoms? Symptoms of this condition include:  Weakness.  Constipation.  Fatigue.  Muscle cramps.  Mental confusion.  Skipped heartbeats or irregular heartbeat (palpitations).  Tingling or numbness. How is this diagnosed? This condition is diagnosed with a blood test. How is this treated? This condition may be treated by:  Taking potassium supplements by mouth.  Adjusting the medicines that you take.  Eating more foods that contain a lot of potassium. If your potassium level is very low, you may need to get potassium through an IV and  be monitored in the hospital. Follow these instructions at home:  Take over-the-counter and prescription medicines only as told by your health care provider. This includes vitamins and supplements.  Eat a healthy diet. A healthy diet includes fresh fruits and vegetables, whole grains, healthy fats, and lean proteins.  If instructed, eat more foods that contain a lot of potassium. This includes: ? Nuts, such as peanuts and pistachios. ? Seeds, such as sunflower seeds and pumpkin seeds. ? Peas, lentils, and lima beans. ? Whole grain and bran cereals and breads. ? Fresh fruits and vegetables, such as apricots, avocado, bananas, cantaloupe, kiwi, oranges, tomatoes, asparagus, and potatoes. ? Orange juice. ? Tomato juice. ? Red meats. ? Yogurt.  Keep all follow-up visits as told by your health care provider. This is important.   Contact a health care provider if you:  Have weakness that gets worse.  Feel your heart pounding or racing.  Vomit.  Have diarrhea.  Have diabetes (diabetes mellitus) and you have trouble keeping your blood sugar (glucose) in your target range. Get help right away if you:  Have chest pain.  Have shortness of breath.  Have vomiting or diarrhea that lasts for more than 2 days.  Faint. Summary  Hypokalemia means that the amount of potassium in the  blood is lower than normal.  This condition is diagnosed with a blood test.  Hypokalemia may be treated by taking potassium supplements, adjusting the medicines that you take, or eating more foods that are high in potassium.  If your potassium level is very low, you may need to get potassium through an IV and be monitored in the hospital. This information is not intended to replace advice given to you by your health care provider. Make sure you discuss any questions you have with your health care provider. Document Revised: 06/01/2018 Document Reviewed: 06/01/2018 Elsevier Patient Education  2021 [place].  Drug Allergy A drug allergy is when your body reacts in a bad way to a medicine. The reaction may be mild or very bad. In some cases, it can be life-threatening. If you have an allergic reaction, get help right away. You should get help even if the reaction seems mild. What are the causes? This condition is caused by a reaction in your body's defense system (immune system). The system sees a medicine as being harmful when it is not. What are the signs or symptoms? Symptoms of a mild reaction  A stuffy nose (nasal congestion).  Tingling in your mouth.  An itchy, red rash. Symptoms of a very bad reaction  Swelling of your eyes, lips, face, or tongue.  Swelling of the back of your mouth and your throat.  Breathing loudly (wheezing).  A hoarse voice.  Itchy, red, swollen areas of skin (hives).  Feeling dizzy or light-headed.  Passing out (fainting).  Feeling worried or nervous (anxiety).  Feeling mixed up (confused).  Pain in your belly (abdomen).  Trouble with breathing, talking, or swallowing.  A tight feeling in your chest.  Fast or uneven heartbeats (palpitations).  Throwing up (vomiting).  Watery poop (diarrhea). How is this treated? There is no cure for allergies. An allergic reaction can be treated with:  Medicines to help your symptoms.  Medicines that you breathe into your lungs (respiratory inhalers).  An allergy shot (epinephrine injection). For a very bad reaction, you may need to stay in the hospital. Your doctor may teach you how to use an allergy kit (anaphylaxis kit) and how to give yourself an allergy shot. You can give yourself an allergy shot with what is called an auto-injector "pen." Follow these instructions at home: If you have a very bad allergy:  Always keep an auto-injector pen or your allergy kit with you. These could save your life. Use them as told by your doctor.  Make sure that you, the people who live with you, and your  employer know: ? How to use your allergy kit. ? How to use an auto-injector pen to give you an allergy shot.  If you used your auto-injector pen: ? Get more medicine for it right away. This is important in case you have another reaction. ? Get help right away.  Wear a medical alert bracelet or necklace that says you have an allergy, if your doctor tells you to do this.   General instructions  Avoid medicines that you are allergic to.  Take over-the-counter and prescription medicines only as told by your doctor.  Do not drive until your doctor says it is safe.  If you have itchy, red, swollen areas of skin or a rash: ? Use an over-the-counter medicine (antihistamine) as told by your doctor. ? Put cold, wet cloths (cold compresses) on your skin. ? Take baths or showers in cool water. Avoid hot water.  If you had tests done, it is up to you to get your test results. Ask your doctor when your results will be ready.  Tell any doctors who care for you that you have a drug allergy.  Keep all follow-up visits as told by your doctor. This is important. Contact a doctor if:  You think that you are having a mild allergic reaction.  You have symptoms that last more than 2 days after your reaction.  Your symptoms get worse.  You get new symptoms. Get help right away if:  You had to use your auto-injector pen. You must go to the emergency room, even if the medicine seems to be working.  You have symptoms of a very bad allergic reaction. These symptoms may be an emergency. Do not wait to see if the symptoms will go away. Use your auto-injector pen or allergy kit as you have been told. Get medical help right away. Call your local emergency services (911 in the [place] Do not drive yourself to the hospital. Summary  A drug allergy is when your body reacts in a bad way to a medicine.  Take medicines only as told by your doctor.  Tell any doctors who care for you that you have a drug  allergy.  Always keep an auto-injector pen or your allergy kit with you if you have a very bad allergy. This information is not intended to replace advice given to you by your health care provider. Make sure you discuss any questions you have with your health care provider. Document Revised: 05/04/2018 Document Reviewed: 05/04/2018 Elsevier Patient Education  [place].

## 2021-01-30 ENCOUNTER — Telehealth: Payer: Self-pay | Admitting: Medical Oncology

## 2021-01-30 NOTE — Telephone Encounter (Signed)
Pt had allergic reaction  to iron infusion last week He said to cancel his appt tomorrow and he is going to talk to orthopedist about getting knee surgery.  Appt cancelled.

## 2021-01-31 ENCOUNTER — Ambulatory Visit: Payer: Medicare Other

## 2021-01-31 NOTE — Progress Notes (Signed)
Spoke with patient, is aware of results. 

## 2021-02-03 ENCOUNTER — Ambulatory Visit: Payer: Self-pay | Admitting: Student

## 2021-02-04 ENCOUNTER — Ambulatory Visit: Payer: Self-pay | Admitting: Student

## 2021-02-04 NOTE — H&P (View-Only) (Signed)
TOTAL KNEE ADMISSION H&P  Patient is being admitted for left total knee arthroplasty.  Subjective:  Chief Complaint:left knee pain.  HPI: Scott Ransom Sr., 75 y.o. male, has a history of pain and functional disability in the left knee due to arthritis and has failed non-surgical conservative treatments for greater than 12 weeks to includeNSAID's and/or analgesics and activity modification.  Onset of symptoms was gradual, starting 3 years ago with gradually worsening course since that time. The patient noted no past surgery on the left knee(s).  Patient currently rates pain in the left knee(s) at 10 out of 10 with activity. Patient has worsening of pain with activity and weight bearing, pain that interferes with activities of daily living, pain with passive range of motion, crepitus and joint swelling.  Patient has evidence of subchondral cysts, subchondral sclerosis and joint space narrowing by imaging studies.There is no active infection.  Patient Active Problem List   Diagnosis Date Noted  . Primary osteoarthritis of left knee 12/03/2020  . Erectile dysfunction 04/24/2020  . S/P carpal tunnel release 04/24/2020  . Low back pain 10/22/2014  . Rotator cuff tendinitis 06/04/2014  . Gout 06/17/2010  . CKD (chronic kidney disease) stage 3, GFR 30-59 ml/min (HCC) 06/04/2010  . Anemia 08/02/2009  . Hyperlipidemia 07/13/2007  . Essential hypertension 07/13/2007  . GERD 07/13/2007  . B12 deficiency 07/07/2007   Past Medical History:  Diagnosis Date  . ANEMIA, OTHER UNSPEC 08/02/2009  . Arthritis   . B12 DEFICIENCY 07/07/2007  . Bell's palsy     around 2005  . BPPV (benign paroxysmal positional vertigo)    2003  . Cataract   . EKG abnormalities   . GERD 07/13/2007  . Gout, unspecified 06/17/2010  . HYPERLIPIDEMIA 07/13/2007  . HYPERTENSION 07/13/2007  . RENAL INSUFFICIENCY 06/04/2010    Past Surgical History:  Procedure Laterality Date  . BACK SURGERY  07/13/07  . CARPAL TUNNEL RELEASE  Bilateral   . CATARACT EXTRACTION, BILATERAL Bilateral     Current Outpatient Medications  Medication Sig Dispense Refill Last Dose  . allopurinol (ZYLOPRIM) 300 MG tablet TAKE 1 TABLET BY MOUTH EVERY DAY (Patient taking differently: Take 300 mg by mouth in the morning.) 90 tablet 3   . celecoxib (CELEBREX) 100 MG capsule TAKE 1 CAPSULE (100 MG TOTAL) BY MOUTH 2 (TWO) TIMES DAILY AS NEEDED FOR MODERATE PAIN (SHOULDER). (Patient taking differently: Take 100 mg by mouth in the morning.) 60 capsule 5   . colchicine 0.6 MG tablet TAKE 1 TABLET BY MOUTH EVERY DAY (Patient taking differently: Take 0.6 mg by mouth daily as needed (gout flares).) 30 tablet 1   . Cyanocobalamin 1000 MCG SUBL Place 1 tablet (1,000 mcg total) under the tongue once a week. (Patient taking differently: Place 1,000 mcg under the tongue 2 (two) times a week. Wednesdays & Sundays) 4 each 4   . Ferrous Sulfate (IRON PO) Take 1 tablet by mouth in the morning and at bedtime.     . fluticasone (FLONASE) 50 MCG/ACT nasal spray SPRAY 2 SPRAYS INTO EACH NOSTRIL EVERY DAY (Patient taking differently: Place 2 sprays into both nostrils daily as needed for allergies.) 48 mL 1   . HYDROcodone-acetaminophen (NORCO/VICODIN) 5-325 MG tablet Take 1 tablet by mouth every 8 (eight) hours as needed (knee pain).     Marland Kitchen losartan (COZAAR) 50 MG tablet Take 1 tablet (50 mg total) by mouth daily. (Patient taking differently: Take 50 mg by mouth at bedtime.) 90 tablet 3   .  Polyethylene Glycol 3350 POWD Take 17 g by mouth at bedtime.     . predniSONE (DELTASONE) 20 MG tablet Take 2 tablets (40 mg total) by mouth daily. (Patient not taking: Reported on 02/03/2021) 8 tablet 0   . sildenafil (VIAGRA) 50 MG tablet Take one half tab (25 mg) daily as needed 1 hour prior to sexual activity. (Patient not taking: Reported on 02/03/2021) 10 tablet 0   . traMADol (ULTRAM) 50 MG tablet TAKE 2 TABLETS BY MOUTH (TWO) TIMES DAILY. (Patient taking differently: Take 100 mg by  mouth 2 (two) times daily.) 120 tablet 1   . triamterene-hydrochlorothiazide (MAXZIDE-25) 37.5-25 MG tablet Take 1 tablet by mouth daily. (Patient taking differently: Take 1 tablet by mouth in the morning.) 90 tablet 3    No current facility-administered medications for this visit.   Allergies  Allergen Reactions  . Ferumoxytol Other (See Comments)    Difficulty breathing. Infusion reaction 01/24/21.  . Nsaids Other (See Comments)     hx of renal insufficiency  . Statins     myalgias    Social History   Tobacco Use  . Smoking status: Never Smoker  . Smokeless tobacco: Never Used  Substance Use Topics  . Alcohol use: No    Family History  Problem Relation Age of Onset  . Arthritis Father      Review of Systems  Musculoskeletal: Positive for arthralgias.  All other systems reviewed and are negative.   Objective:  Physical Exam HENT:     Head: Normocephalic.  Eyes:     Pupils: Pupils are equal, round, and reactive to light.  Cardiovascular:     Rate and Rhythm: Normal rate and regular rhythm.     Pulses: Normal pulses.  Pulmonary:     Effort: Pulmonary effort is normal.  Abdominal:     Palpations: Abdomen is soft.     Tenderness: There is no abdominal tenderness.  Genitourinary:    Comments: Deferred Musculoskeletal:     Cervical back: Normal range of motion and neck supple.     Comments: Examination of the left knee reveals no skin wounds or lesions. Varus deformity. He has swelling, trace effusion. No warmth or erythema. Exquisite tenderness to palpation medial compartment. Also has tenderness to palpation lateral compartment and peripatellar retinacular tissues with positive grind sign. Range of motion 35 to 110 degrees. He does have significant varus/valgus pseudolaxity. Painless range of motion of the hip.  Skin:    General: Skin is warm and dry.  Neurological:     Mental Status: He is alert and oriented to person, place, and time.  Psychiatric:        Mood  and Affect: Mood normal.     Vital signs in last 24 hours: @VSRANGES @  Labs:   Estimated body mass index is 25.54 kg/m as calculated from the following:   Height as of 01/24/21: 5\' 10"  (1.778 m).   Weight as of 01/24/21: 80.7 kg.   Imaging Review Plain radiographs demonstrate severe degenerative joint disease of the left knee(s). The overall alignment issignificant varus. The bone quality appears to be adequate for age and reported activity level.      Assessment/Plan:  End stage arthritis, left knee   The patient history, physical examination, clinical judgment of the provider and imaging studies are consistent with end stage degenerative joint disease of the left knee(s) and total knee arthroplasty is deemed medically necessary. The treatment options including medical management, injection therapy arthroscopy and arthroplasty were discussed  at length. The risks and benefits of total knee arthroplasty were presented and reviewed. The risks due to aseptic loosening, infection, stiffness, patella tracking problems, thromboembolic complications and other imponderables were discussed. The patient acknowledged the explanation, agreed to proceed with the plan and consent was signed. Patient is being admitted for inpatient treatment for surgery, pain control, PT, OT, prophylactic antibiotics, VTE prophylaxis, progressive ambulation and ADL's and discharge planning. The patient is planning to be discharged home after overnight observation     Patient's anticipated LOS is less than 2 midnights, meeting these requirements: - Lives within 1 hour of care - Has a competent adult at home to recover with post-op recover - NO history of  - Chronic pain requiring opiods  - Diabetes  - Coronary Artery Disease  - Heart failure  - Heart attack  - Stroke  - DVT/VTE  - Cardiac arrhythmia  - Respiratory Failure/COPD  - Renal failure  - Anemia  - Advanced Liver disease

## 2021-02-04 NOTE — H&P (Signed)
TOTAL KNEE ADMISSION H&P  Patient is being admitted for left total knee arthroplasty.  Subjective:  Chief Complaint:left knee pain.  HPI: Scott Ransom Sr., 75 y.o. male, has a history of pain and functional disability in the left knee due to arthritis and has failed non-surgical conservative treatments for greater than 12 weeks to includeNSAID's and/or analgesics and activity modification.  Onset of symptoms was gradual, starting 3 years ago with gradually worsening course since that time. The patient noted no past surgery on the left knee(s).  Patient currently rates pain in the left knee(s) at 10 out of 10 with activity. Patient has worsening of pain with activity and weight bearing, pain that interferes with activities of daily living, pain with passive range of motion, crepitus and joint swelling.  Patient has evidence of subchondral cysts, subchondral sclerosis and joint space narrowing by imaging studies.There is no active infection.  Patient Active Problem List   Diagnosis Date Noted  . Primary osteoarthritis of left knee 12/03/2020  . Erectile dysfunction 04/24/2020  . S/P carpal tunnel release 04/24/2020  . Low back pain 10/22/2014  . Rotator cuff tendinitis 06/04/2014  . Gout 06/17/2010  . CKD (chronic kidney disease) stage 3, GFR 30-59 ml/min (HCC) 06/04/2010  . Anemia 08/02/2009  . Hyperlipidemia 07/13/2007  . Essential hypertension 07/13/2007  . GERD 07/13/2007  . B12 deficiency 07/07/2007   Past Medical History:  Diagnosis Date  . ANEMIA, OTHER UNSPEC 08/02/2009  . Arthritis   . B12 DEFICIENCY 07/07/2007  . Bell's palsy     around 2005  . BPPV (benign paroxysmal positional vertigo)    2003  . Cataract   . EKG abnormalities   . GERD 07/13/2007  . Gout, unspecified 06/17/2010  . HYPERLIPIDEMIA 07/13/2007  . HYPERTENSION 07/13/2007  . RENAL INSUFFICIENCY 06/04/2010    Past Surgical History:  Procedure Laterality Date  . BACK SURGERY  07/13/07  . CARPAL TUNNEL RELEASE  Bilateral   . CATARACT EXTRACTION, BILATERAL Bilateral     Current Outpatient Medications  Medication Sig Dispense Refill Last Dose  . allopurinol (ZYLOPRIM) 300 MG tablet TAKE 1 TABLET BY MOUTH EVERY DAY (Patient taking differently: Take 300 mg by mouth in the morning.) 90 tablet 3   . celecoxib (CELEBREX) 100 MG capsule TAKE 1 CAPSULE (100 MG TOTAL) BY MOUTH 2 (TWO) TIMES DAILY AS NEEDED FOR MODERATE PAIN (SHOULDER). (Patient taking differently: Take 100 mg by mouth in the morning.) 60 capsule 5   . colchicine 0.6 MG tablet TAKE 1 TABLET BY MOUTH EVERY DAY (Patient taking differently: Take 0.6 mg by mouth daily as needed (gout flares).) 30 tablet 1   . Cyanocobalamin 1000 MCG SUBL Place 1 tablet (1,000 mcg total) under the tongue once a week. (Patient taking differently: Place 1,000 mcg under the tongue 2 (two) times a week. Wednesdays & Sundays) 4 each 4   . Ferrous Sulfate (IRON PO) Take 1 tablet by mouth in the morning and at bedtime.     . fluticasone (FLONASE) 50 MCG/ACT nasal spray SPRAY 2 SPRAYS INTO EACH NOSTRIL EVERY DAY (Patient taking differently: Place 2 sprays into both nostrils daily as needed for allergies.) 48 mL 1   . HYDROcodone-acetaminophen (NORCO/VICODIN) 5-325 MG tablet Take 1 tablet by mouth every 8 (eight) hours as needed (knee pain).     Marland Kitchen losartan (COZAAR) 50 MG tablet Take 1 tablet (50 mg total) by mouth daily. (Patient taking differently: Take 50 mg by mouth at bedtime.) 90 tablet 3   .  Polyethylene Glycol 3350 POWD Take 17 g by mouth at bedtime.     . predniSONE (DELTASONE) 20 MG tablet Take 2 tablets (40 mg total) by mouth daily. (Patient not taking: Reported on 02/03/2021) 8 tablet 0   . sildenafil (VIAGRA) 50 MG tablet Take one half tab (25 mg) daily as needed 1 hour prior to sexual activity. (Patient not taking: Reported on 02/03/2021) 10 tablet 0   . traMADol (ULTRAM) 50 MG tablet TAKE 2 TABLETS BY MOUTH (TWO) TIMES DAILY. (Patient taking differently: Take 100 mg by  mouth 2 (two) times daily.) 120 tablet 1   . triamterene-hydrochlorothiazide (MAXZIDE-25) 37.5-25 MG tablet Take 1 tablet by mouth daily. (Patient taking differently: Take 1 tablet by mouth in the morning.) 90 tablet 3    No current facility-administered medications for this visit.   Allergies  Allergen Reactions  . Ferumoxytol Other (See Comments)    Difficulty breathing. Infusion reaction 01/24/21.  . Nsaids Other (See Comments)     hx of renal insufficiency  . Statins     myalgias    Social History   Tobacco Use  . Smoking status: Never Smoker  . Smokeless tobacco: Never Used  Substance Use Topics  . Alcohol use: No    Family History  Problem Relation Age of Onset  . Arthritis Father      Review of Systems  Musculoskeletal: Positive for arthralgias.  All other systems reviewed and are negative.   Objective:  Physical Exam HENT:     Head: Normocephalic.  Eyes:     Pupils: Pupils are equal, round, and reactive to light.  Cardiovascular:     Rate and Rhythm: Normal rate and regular rhythm.     Pulses: Normal pulses.  Pulmonary:     Effort: Pulmonary effort is normal.  Abdominal:     Palpations: Abdomen is soft.     Tenderness: There is no abdominal tenderness.  Genitourinary:    Comments: Deferred Musculoskeletal:     Cervical back: Normal range of motion and neck supple.     Comments: Examination of the left knee reveals no skin wounds or lesions. Varus deformity. He has swelling, trace effusion. No warmth or erythema. Exquisite tenderness to palpation medial compartment. Also has tenderness to palpation lateral compartment and peripatellar retinacular tissues with positive grind sign. Range of motion 35 to 110 degrees. He does have significant varus/valgus pseudolaxity. Painless range of motion of the hip.  Skin:    General: Skin is warm and dry.  Neurological:     Mental Status: He is alert and oriented to person, place, and time.  Psychiatric:        Mood  and Affect: Mood normal.     Vital signs in last 24 hours: @VSRANGES @  Labs:   Estimated body mass index is 25.54 kg/m as calculated from the following:   Height as of 01/24/21: 5\' 10"  (1.778 m).   Weight as of 01/24/21: 80.7 kg.   Imaging Review Plain radiographs demonstrate severe degenerative joint disease of the left knee(s). The overall alignment issignificant varus. The bone quality appears to be adequate for age and reported activity level.      Assessment/Plan:  End stage arthritis, left knee   The patient history, physical examination, clinical judgment of the provider and imaging studies are consistent with end stage degenerative joint disease of the left knee(s) and total knee arthroplasty is deemed medically necessary. The treatment options including medical management, injection therapy arthroscopy and arthroplasty were discussed  at length. The risks and benefits of total knee arthroplasty were presented and reviewed. The risks due to aseptic loosening, infection, stiffness, patella tracking problems, thromboembolic complications and other imponderables were discussed. The patient acknowledged the explanation, agreed to proceed with the plan and consent was signed. Patient is being admitted for inpatient treatment for surgery, pain control, PT, OT, prophylactic antibiotics, VTE prophylaxis, progressive ambulation and ADL's and discharge planning. The patient is planning to be discharged home after overnight observation     Patient's anticipated LOS is less than 2 midnights, meeting these requirements: - Lives within 1 hour of care - Has a competent adult at home to recover with post-op recover - NO history of  - Chronic pain requiring opiods  - Diabetes  - Coronary Artery Disease  - Heart failure  - Heart attack  - Stroke  - DVT/VTE  - Cardiac arrhythmia  - Respiratory Failure/COPD  - Renal failure  - Anemia  - Advanced Liver disease

## 2021-02-05 NOTE — Patient Instructions (Addendum)
DUE TO COVID-19 ONLY ONE VISITOR IS ALLOWED TO COME WITH YOU AND STAY IN THE WAITING ROOM ONLY DURING PRE OP AND PROCEDURE DAY OF SURGERY. THE 1 VISITOR  MAY VISIT WITH YOU AFTER SURGERY IN YOUR PRIVATE ROOM DURING VISITING HOURS ONLY!  YOU NEED TO HAVE A COVID 19 TEST ON: 02/10/21 @ 10:00 AM , THIS TEST MUST BE DONE BEFORE SURGERY,  COVID TESTING SITE [place] [address], IT IS ON THE RIGHT GOING OUT [address] APPROXIMATELY  2 MINUTES PAST [employer] ON THE RIGHT. ONCE YOUR COVID TEST IS COMPLETED,  PLEASE BEGIN THE QUARANTINE INSTRUCTIONS AS OUTLINED IN YOUR HANDOUT.                Scott Ransom Sr.    Your procedure is scheduled on: 02/13/21   Report to [facility] Main  Entrance   Report to admitting at: 10:30 AM     Call this number if you have problems the morning of surgery [phone]    Remember:  NO SOLID FOOD AFTER MIDNIGHT THE NIGHT PRIOR TO SURGERY. NOTHING BY MOUTH EXCEPT CLEAR LIQUIDS UNTIL: 10:00 AM . PLEASE FINISH ENSURE DRINK PER SURGEON ORDER  WHICH NEEDS TO BE COMPLETED AT: 10:00 AM .  CLEAR LIQUID DIET  Foods Allowed                                                                     Foods Excluded  Coffee and tea, regular and decaf                             liquids that you cannot  Plain Jell-O any favor except red or purple                                           see through such as: Fruit ices (not with fruit pulp)                                     milk, soups, orange juice  Iced Popsicles                                    All solid food Carbonated beverages, regular and diet                                    Cranberry, grape and apple juices Sports drinks like Gatorade Lightly seasoned clear broth or consume(fat free) Sugar, honey syrup  Sample Menu Breakfast                                Lunch  Supper Cranberry juice                    Beef broth                             Chicken broth Jell-O                                     Grape juice                           Apple juice Coffee or tea                        Jell-O                                      Popsicle                                                Coffee or tea                        Coffee or tea  _____________________________________________________________________  BRUSH YOUR TEETH MORNING OF SURGERY AND RINSE YOUR MOUTH OUT, NO CHEWING GUM CANDY OR MINTS.    Take these medicines the morning of surgery with A SIP OF WATER: allopurinol.Use flonase as usual.Colchicine as needed.                               You may not have any metal on your body including hair pins and              piercings  Do not wear jewelry, lotions, powders or perfumes, deodorant             Men may shave face and neck.   Do not bring valuables to the hospital. [place].  Contacts, dentures or bridgework may not be worn into surgery.  Leave suitcase in the car. After surgery it may be brought to your room.     Patients discharged the day of surgery will not be allowed to drive home. IF YOU ARE HAVING SURGERY AND GOING HOME THE SAME DAY, YOU MUST HAVE AN ADULT TO DRIVE YOU HOME AND BE WITH YOU FOR 24 HOURS. YOU MAY GO HOME BY TAXI OR UBER OR ORTHERWISE, BUT AN ADULT MUST ACCOMPANY YOU HOME AND STAY WITH YOU FOR 24 HOURS.  Name and phone number of your driver:  Special Instructions: N/A              Please read over the following fact sheets you were given: _____________________________________________________________________         [facility] - Preparing for Surgery Before surgery, you can play an important role.  Because skin is not sterile, your skin needs to be as free of germs as possible.  You can reduce the number of germs on your skin by washing  with CHG (chlorahexidine gluconate) soap before surgery.  CHG is an antiseptic cleaner which kills germs  and bonds with the skin to continue killing germs even after washing. Please DO NOT use if you have an allergy to CHG or antibacterial soaps.  If your skin becomes reddened/irritated stop using the CHG and inform your nurse when you arrive at Short Stay. Do not shave (including legs and underarms) for at least 48 hours prior to the first CHG shower.  You may shave your face/neck. Please follow these instructions carefully:  1.  Shower with CHG Soap the night before surgery and the  morning of Surgery.  2.  If you choose to wash your hair, wash your hair first as usual with your  normal  shampoo.  3.  After you shampoo, rinse your hair and body thoroughly to remove the  shampoo.                           4.  Use CHG as you would any other liquid soap.  You can apply chg directly  to the skin and wash                       Gently with a scrungie or clean washcloth.  5.  Apply the CHG Soap to your body ONLY FROM THE NECK DOWN.   Do not use on face/ open                           Wound or open sores. Avoid contact with eyes, ears mouth and genitals (private parts).                       Wash face,  Genitals (private parts) with your normal soap.             6.  Wash thoroughly, paying special attention to the area where your surgery  will be performed.  7.  Thoroughly rinse your body with warm water from the neck down.  8.  DO NOT shower/wash with your normal soap after using and rinsing off  the CHG Soap.                9.  Pat yourself dry with a clean towel.            10.  Wear clean pajamas.            11.  Place clean sheets on your bed the night of your first shower and do not  sleep with pets. Day of Surgery : Do not apply any lotions/deodorants the morning of surgery.  Please wear clean clothes to the hospital/surgery center.  FAILURE TO FOLLOW THESE INSTRUCTIONS MAY RESULT IN THE CANCELLATION OF YOUR SURGERY PATIENT SIGNATURE_________________________________  NURSE  SIGNATURE__________________________________  ________________________________________________________________________   Scott [name]  An incentive spirometer is a tool that can help keep your lungs clear and active. This tool measures how well you are filling your lungs with each breath. Taking long deep breaths may help reverse or decrease the chance of developing breathing (pulmonary) problems (especially infection) following:  A long period of time when you are unable to move or be active. BEFORE THE PROCEDURE   If the spirometer includes an indicator to show your best effort, your nurse or respiratory therapist will set it to a desired goal.  If possible, sit up straight  or lean slightly forward. Try not to slouch.  Hold the incentive spirometer in an upright position. INSTRUCTIONS FOR USE  1. Sit on the edge of your bed if possible, or sit up as far as you can in bed or on a chair. 2. Hold the incentive spirometer in an upright position. 3. Breathe out normally. 4. Place the mouthpiece in your mouth and seal your lips tightly around it. 5. Breathe in slowly and as deeply as possible, raising the piston or the ball toward the top of the column. 6. Hold your breath for 3-5 seconds or for as long as possible. Allow the piston or ball to fall to the bottom of the column. 7. Remove the mouthpiece from your mouth and breathe out normally. 8. Rest for a few seconds and repeat Steps 1 through 7 at least 10 times every 1-2 hours when you are awake. Take your time and take a few normal breaths between deep breaths. 9. The spirometer may include an indicator to show your best effort. Use the indicator as a goal to work toward during each repetition. 10. After each set of 10 deep breaths, practice coughing to be sure your lungs are clear. If you have an incision (the cut made at the time of surgery), support your incision when coughing by placing a pillow or rolled up towels firmly  against it. Once you are able to get out of bed, walk around indoors and cough well. You may stop using the incentive spirometer when instructed by your caregiver.  RISKS AND COMPLICATIONS  Take your time so you do not get dizzy or light-headed.  If you are in pain, you may need to take or ask for pain medication before doing incentive spirometry. It is harder to take a deep breath if you are having pain. AFTER USE  Rest and breathe slowly and easily.  It can be helpful to keep track of a log of your progress. Your caregiver can provide you with a simple table to help with this. If you are using the spirometer at home, follow these instructions: [place] IF:   You are having difficultly using the spirometer.  You have trouble using the spirometer as often as instructed.  Your pain medication is not giving enough relief while using the spirometer.  You develop fever of 100.5 F (38.1 C) or higher. SEEK IMMEDIATE MEDICAL CARE IF:   You cough up bloody sputum that had not been present before.  You develop fever of 102 F (38.9 C) or greater.  You develop worsening pain at or near the incision site. MAKE SURE YOU:   Understand these instructions.  Will watch your condition.  Will get help right away if you are not doing well or get worse. Document Released: 03/01/2007 Document Revised: 01/11/2012 Document Reviewed: 05/02/2007 [facility] Patient Information 2014 [place], [place].   ________________________________________________________________________

## 2021-02-06 ENCOUNTER — Other Ambulatory Visit: Payer: Self-pay

## 2021-02-06 ENCOUNTER — Encounter (HOSPITAL_COMMUNITY)
Admission: RE | Admit: 2021-02-06 | Discharge: 2021-02-06 | Disposition: A | Discharge status: Home / Self Care | Payer: Medicare Other | Source: Ambulatory Visit | Attending: Orthopedic Surgery | Admitting: Orthopedic Surgery

## 2021-02-06 ENCOUNTER — Encounter (HOSPITAL_COMMUNITY): Payer: Self-pay

## 2021-02-06 DIAGNOSIS — Z01812 Encounter for preprocedural laboratory examination: Secondary | ICD-10-CM | POA: Diagnosis not present

## 2021-02-06 LAB — URINALYSIS, ROUTINE W REFLEX MICROSCOPIC
Bilirubin Urine: NEGATIVE
Glucose, UA: NEGATIVE mg/dL
Hgb urine dipstick: NEGATIVE
Ketones, ur: NEGATIVE mg/dL
Leukocytes,Ua: NEGATIVE
Nitrite: NEGATIVE
Protein, ur: 30 mg/dL — AB
Specific Gravity, Urine: 1.011 (ref 1.005–1.030)
pH: 6 (ref 5.0–8.0)

## 2021-02-06 LAB — COMPREHENSIVE METABOLIC PANEL
ALT: 21 U/L (ref 0–44)
AST: 29 U/L (ref 15–41)
Albumin: 4 g/dL (ref 3.5–5.0)
Alkaline Phosphatase: 134 U/L — ABNORMAL HIGH (ref 38–126)
Anion gap: 9 (ref 5–15)
BUN: 20 mg/dL (ref 8–23)
CO2: 27 mmol/L (ref 22–32)
Calcium: 9.8 mg/dL (ref 8.9–10.3)
Chloride: 96 mmol/L — ABNORMAL LOW (ref 98–111)
Creatinine, Ser: 1.3 mg/dL — ABNORMAL HIGH (ref 0.61–1.24)
GFR, Estimated: 57 mL/min — ABNORMAL LOW (ref >60)
Glucose, Bld: 92 mg/dL (ref 70–99)
Potassium: 4.3 mmol/L (ref 3.5–5.1)
Sodium: 132 mmol/L — ABNORMAL LOW (ref 135–145)
Total Bilirubin: 0.8 mg/dL (ref 0.3–1.2)
Total Protein: 7.4 g/dL (ref 6.5–8.1)

## 2021-02-06 LAB — CBC
HCT: 38.2 % — ABNORMAL LOW (ref 39.0–52.0)
Hemoglobin: 12.4 g/dL — ABNORMAL LOW (ref 13.0–17.0)
MCH: 28.1 pg (ref 26.0–34.0)
MCHC: 32.5 g/dL (ref 30.0–36.0)
MCV: 86.4 fL (ref 80.0–100.0)
Platelets: 231 10*3/uL (ref 150–400)
RBC: 4.42 MIL/uL (ref 4.22–5.81)
RDW: 14.9 % (ref 11.5–15.5)
WBC: 5.1 10*3/uL (ref 4.0–10.5)
nRBC: 0 % (ref 0.0–0.2)

## 2021-02-06 LAB — PROTIME-INR
INR: 1 (ref 0.8–1.2)
Prothrombin Time: 13.1 seconds (ref 11.4–15.2)

## 2021-02-06 LAB — SURGICAL PCR SCREEN
MRSA, PCR: NEGATIVE
Staphylococcus aureus: NEGATIVE

## 2021-02-06 NOTE — Progress Notes (Addendum)
COVID Vaccine Completed: Yes Date COVID Vaccine completed: 08/07/20 [place] COVID vaccine manufacturer: Pfizer     PCP - Dr. Grier Mitts. LOV: 01/29/21 Cardiologist - Dr. Daneen Schick. LOV: 12/04/20  Chest x-ray - 01/24/21 EKG - 01/27/21 Stress Test -  ECHO - 01/28/21 Cardiac Cath -  Pacemaker/ICD device last checked:  Sleep Study -  CPAP -   Fasting Blood Sugar -  Checks Blood Sugar _____ times a day  Blood Thinner Instructions: Aspirin Instructions: Last Dose:  Anesthesia review: Hx: HTN,CKD(III),abnormal EKG.  Patient denies shortness of breath, fever, cough and chest pain at PAT appointment   Patient verbalized understanding of instructions that were given to them at the PAT appointment. Patient was also instructed that they will need to review over the PAT instructions again at home before surgery.

## 2021-02-10 ENCOUNTER — Telehealth: Payer: Self-pay

## 2021-02-10 ENCOUNTER — Other Ambulatory Visit (HOSPITAL_COMMUNITY)
Admission: RE | Admit: 2021-02-10 | Discharge: 2021-02-10 | Disposition: A | Discharge status: Home / Self Care | Payer: Medicare Other | Source: Ambulatory Visit | Attending: Orthopedic Surgery | Admitting: Orthopedic Surgery

## 2021-02-10 DIAGNOSIS — Z01812 Encounter for preprocedural laboratory examination: Secondary | ICD-10-CM | POA: Diagnosis not present

## 2021-02-10 DIAGNOSIS — Z20822 Contact with and (suspected) exposure to covid-19: Secondary | ICD-10-CM | POA: Insufficient documentation

## 2021-02-10 LAB — SARS CORONAVIRUS 2 (TAT 6-24 HRS): SARS Coronavirus 2: NEGATIVE

## 2021-02-10 NOTE — Telephone Encounter (Signed)
Pt LM stating he needs to cancel his 02/12/21 follow-up appts with Dr. Julien Nordmann.  I called pt back to determine when he would like to have them r/s for and pt states he will not be returning because he can now have his knee surgery. When I attempted to explain to the pt that it is a follow-up appt to ensure he does not need to be on any further oral or IV tx, pt again stated he "is not coming here anymore and was not going to tell me again".

## 2021-02-12 ENCOUNTER — Other Ambulatory Visit: Payer: Medicare Other

## 2021-02-12 ENCOUNTER — Ambulatory Visit: Payer: Medicare Other | Admitting: Internal Medicine

## 2021-02-13 ENCOUNTER — Ambulatory Visit (HOSPITAL_COMMUNITY): Payer: Medicare Other | Admitting: Anesthesiology

## 2021-02-13 ENCOUNTER — Encounter (HOSPITAL_COMMUNITY)
Admission: RE | Disposition: A | Discharge status: Home / Self Care | Payer: Self-pay | Source: Ambulatory Visit | Attending: Orthopedic Surgery

## 2021-02-13 ENCOUNTER — Ambulatory Visit (HOSPITAL_COMMUNITY): Payer: Medicare Other

## 2021-02-13 ENCOUNTER — Other Ambulatory Visit: Payer: Self-pay

## 2021-02-13 ENCOUNTER — Encounter (HOSPITAL_COMMUNITY): Payer: Self-pay | Admitting: Orthopedic Surgery

## 2021-02-13 ENCOUNTER — Observation Stay (HOSPITAL_COMMUNITY)
Admission: RE | Admit: 2021-02-13 | Discharge: 2021-02-16 | Disposition: A | Discharge status: Home / Self Care | Payer: Medicare Other | Source: Ambulatory Visit | Attending: Orthopedic Surgery | Admitting: Orthopedic Surgery

## 2021-02-13 DIAGNOSIS — M109 Gout, unspecified: Secondary | ICD-10-CM | POA: Insufficient documentation

## 2021-02-13 DIAGNOSIS — G8918 Other acute postprocedural pain: Secondary | ICD-10-CM | POA: Diagnosis not present

## 2021-02-13 DIAGNOSIS — D649 Anemia, unspecified: Secondary | ICD-10-CM | POA: Diagnosis not present

## 2021-02-13 DIAGNOSIS — Z471 Aftercare following joint replacement surgery: Secondary | ICD-10-CM | POA: Diagnosis not present

## 2021-02-13 DIAGNOSIS — E785 Hyperlipidemia, unspecified: Secondary | ICD-10-CM | POA: Insufficient documentation

## 2021-02-13 DIAGNOSIS — Z79899 Other long term (current) drug therapy: Secondary | ICD-10-CM | POA: Diagnosis not present

## 2021-02-13 DIAGNOSIS — K219 Gastro-esophageal reflux disease without esophagitis: Secondary | ICD-10-CM | POA: Diagnosis not present

## 2021-02-13 DIAGNOSIS — Z791 Long term (current) use of non-steroidal anti-inflammatories (NSAID): Secondary | ICD-10-CM | POA: Diagnosis not present

## 2021-02-13 DIAGNOSIS — N183 Chronic kidney disease, stage 3 unspecified: Secondary | ICD-10-CM | POA: Diagnosis not present

## 2021-02-13 DIAGNOSIS — Z96652 Presence of left artificial knee joint: Secondary | ICD-10-CM | POA: Diagnosis not present

## 2021-02-13 DIAGNOSIS — M1712 Unilateral primary osteoarthritis, left knee: Principal | ICD-10-CM | POA: Diagnosis present

## 2021-02-13 DIAGNOSIS — I129 Hypertensive chronic kidney disease with stage 1 through stage 4 chronic kidney disease, or unspecified chronic kidney disease: Secondary | ICD-10-CM | POA: Diagnosis not present

## 2021-02-13 DIAGNOSIS — M21162 Varus deformity, not elsewhere classified, left knee: Secondary | ICD-10-CM | POA: Diagnosis not present

## 2021-02-13 DIAGNOSIS — N529 Male erectile dysfunction, unspecified: Secondary | ICD-10-CM | POA: Diagnosis not present

## 2021-02-13 DIAGNOSIS — D631 Anemia in chronic kidney disease: Secondary | ICD-10-CM | POA: Diagnosis not present

## 2021-02-13 DIAGNOSIS — M25462 Effusion, left knee: Secondary | ICD-10-CM | POA: Diagnosis not present

## 2021-02-13 HISTORY — PX: KNEE ARTHROPLASTY: SHX992

## 2021-02-13 SURGERY — ARTHROPLASTY, KNEE, TOTAL, USING IMAGELESS COMPUTER-ASSISTED NAVIGATION
Anesthesia: Regional | Site: Knee | Laterality: Left

## 2021-02-13 MED ORDER — 0.9 % SODIUM CHLORIDE (POUR BTL) OPTIME
TOPICAL | Status: DC | PRN
  Administered 2021-02-13: 1000 mL

## 2021-02-13 MED ORDER — DEXAMETHASONE SODIUM PHOSPHATE 10 MG/ML IJ SOLN
INTRAMUSCULAR | Status: DC | PRN
  Administered 2021-02-13: 5 mg via INTRAVENOUS

## 2021-02-13 MED ORDER — FENTANYL CITRATE (PF) 100 MCG/2ML IJ SOLN: 50.0000 ug | INTRAMUSCULAR | Status: DC

## 2021-02-13 MED ORDER — CHLORHEXIDINE GLUCONATE 0.12 % MT SOLN
15.0000 mL | Freq: Once | OROMUCOSAL | Status: AC
  Administered 2021-02-13: 15 mL via OROMUCOSAL

## 2021-02-13 MED ORDER — OXYCODONE HCL 5 MG PO TABS
10.0000 mg | ORAL_TABLET | ORAL | Status: DC | PRN
  Administered 2021-02-14: 10 mg via ORAL

## 2021-02-13 MED ORDER — OXYCODONE HCL 5 MG PO TABS
5.0000 mg | ORAL_TABLET | ORAL | Status: DC | PRN
  Administered 2021-02-13 – 2021-02-16 (×9): 10 mg via ORAL
  Filled 2021-02-13 (×10): qty 2

## 2021-02-13 MED ORDER — TRANEXAMIC ACID-NACL 1000-0.7 MG/100ML-% IV SOLN
1000.0000 mg | Freq: Once | INTRAVENOUS | Status: AC
  Administered 2021-02-13: 1000 mg via INTRAVENOUS
  Filled 2021-02-13: qty 100

## 2021-02-13 MED ORDER — EPHEDRINE SULFATE-NACL 50-0.9 MG/10ML-% IV SOSY
PREFILLED_SYRINGE | INTRAVENOUS | Status: DC | PRN
  Administered 2021-02-13: 10 mg via INTRAVENOUS
  Administered 2021-02-13: 5 mg via INTRAVENOUS
  Administered 2021-02-13: 10 mg via INTRAVENOUS

## 2021-02-13 MED ORDER — ALLOPURINOL 300 MG PO TABS
300.0000 mg | ORAL_TABLET | Freq: Every morning | ORAL | Status: DC
  Administered 2021-02-14 – 2021-02-16 (×3): 300 mg via ORAL
  Filled 2021-02-13 (×3): qty 1

## 2021-02-13 MED ORDER — KETOROLAC TROMETHAMINE 30 MG/ML IJ SOLN
INTRAMUSCULAR | Status: DC | PRN
  Administered 2021-02-13: 30 mg

## 2021-02-13 MED ORDER — CELECOXIB 100 MG PO CAPS
100.0000 mg | ORAL_CAPSULE | Freq: Every morning | ORAL | Status: DC
  Administered 2021-02-14 – 2021-02-16 (×3): 100 mg via ORAL
  Filled 2021-02-13 (×3): qty 1

## 2021-02-13 MED ORDER — AMISULPRIDE (ANTIEMETIC) 5 MG/2ML IV SOLN: 10.0000 mg | Freq: Once | INTRAVENOUS | Status: DC | PRN

## 2021-02-13 MED ORDER — STERILE WATER FOR IRRIGATION IR SOLN
Status: DC | PRN
  Administered 2021-02-13: 2000 mL

## 2021-02-13 MED ORDER — CEFAZOLIN SODIUM-DEXTROSE 2-4 GM/100ML-% IV SOLN
2.0000 g | Freq: Four times a day (QID) | INTRAVENOUS | Status: AC
  Administered 2021-02-13 – 2021-02-14 (×2): 2 g via INTRAVENOUS
  Filled 2021-02-13 (×2): qty 100

## 2021-02-13 MED ORDER — POVIDONE-IODINE 10 % EX SWAB
2.0000 "application " | Freq: Once | CUTANEOUS | Status: AC
  Administered 2021-02-13: 2 via TOPICAL

## 2021-02-13 MED ORDER — SENNA 8.6 MG PO TABS
1.0000 | ORAL_TABLET | Freq: Two times a day (BID) | ORAL | Status: DC
  Administered 2021-02-13 – 2021-02-16 (×6): 8.6 mg via ORAL
  Filled 2021-02-13 (×6): qty 1

## 2021-02-13 MED ORDER — FENTANYL CITRATE (PF) 100 MCG/2ML IJ SOLN
INTRAMUSCULAR | Status: AC
  Filled 2021-02-13: qty 2

## 2021-02-13 MED ORDER — PROPOFOL 10 MG/ML IV BOLUS
INTRAVENOUS | Status: AC
  Filled 2021-02-13: qty 20

## 2021-02-13 MED ORDER — LIDOCAINE 2% (20 MG/ML) 5 ML SYRINGE
INTRAMUSCULAR | Status: DC | PRN
  Administered 2021-02-13: 80 mg via INTRAVENOUS

## 2021-02-13 MED ORDER — BUPIVACAINE-EPINEPHRINE (PF) 0.5% -1:200000 IJ SOLN
INTRAMUSCULAR | Status: DC | PRN
  Administered 2021-02-13: 30 mL via PERINEURAL

## 2021-02-13 MED ORDER — SODIUM CHLORIDE 0.9 % IV SOLN: INTRAVENOUS | Status: DC

## 2021-02-13 MED ORDER — DEXAMETHASONE SODIUM PHOSPHATE 10 MG/ML IJ SOLN
10.0000 mg | Freq: Once | INTRAMUSCULAR | Status: AC
  Administered 2021-02-14: 10 mg via INTRAVENOUS
  Filled 2021-02-13: qty 1

## 2021-02-13 MED ORDER — FENTANYL CITRATE (PF) 100 MCG/2ML IJ SOLN
INTRAMUSCULAR | Status: DC | PRN
  Administered 2021-02-13 (×4): 50 ug via INTRAVENOUS

## 2021-02-13 MED ORDER — SODIUM CHLORIDE (PF) 0.9 % IJ SOLN
INTRAMUSCULAR | Status: DC | PRN
  Administered 2021-02-13: 50 mL

## 2021-02-13 MED ORDER — DOCUSATE SODIUM 100 MG PO CAPS
100.0000 mg | ORAL_CAPSULE | Freq: Two times a day (BID) | ORAL | Status: DC
  Administered 2021-02-13 – 2021-02-16 (×6): 100 mg via ORAL
  Filled 2021-02-13 (×6): qty 1

## 2021-02-13 MED ORDER — EPHEDRINE 5 MG/ML INJ
INTRAVENOUS | Status: AC
  Filled 2021-02-13: qty 10

## 2021-02-13 MED ORDER — METOCLOPRAMIDE HCL 5 MG PO TABS: 5.0000 mg | ORAL_TABLET | Freq: Three times a day (TID) | ORAL | Status: DC | PRN

## 2021-02-13 MED ORDER — TRANEXAMIC ACID-NACL 1000-0.7 MG/100ML-% IV SOLN
1000.0000 mg | INTRAVENOUS | Status: AC
  Administered 2021-02-13: 1000 mg via INTRAVENOUS
  Filled 2021-02-13: qty 100

## 2021-02-13 MED ORDER — ONDANSETRON HCL 4 MG/2ML IJ SOLN
INTRAMUSCULAR | Status: DC | PRN
  Administered 2021-02-13: 4 mg via INTRAVENOUS

## 2021-02-13 MED ORDER — METOCLOPRAMIDE HCL 5 MG/ML IJ SOLN: 5.0000 mg | Freq: Three times a day (TID) | INTRAMUSCULAR | Status: DC | PRN

## 2021-02-13 MED ORDER — FENTANYL CITRATE (PF) 100 MCG/2ML IJ SOLN: 25.0000 ug | INTRAMUSCULAR | Status: DC | PRN

## 2021-02-13 MED ORDER — SODIUM CHLORIDE 0.9% FLUSH
INTRAVENOUS | Status: DC | PRN
  Administered 2021-02-13: 30 mL

## 2021-02-13 MED ORDER — ONDANSETRON HCL 4 MG PO TABS: 4.0000 mg | ORAL_TABLET | Freq: Four times a day (QID) | ORAL | Status: DC | PRN

## 2021-02-13 MED ORDER — CEFAZOLIN SODIUM-DEXTROSE 2-4 GM/100ML-% IV SOLN
2.0000 g | INTRAVENOUS | Status: AC
  Administered 2021-02-13: 2 g via INTRAVENOUS
  Filled 2021-02-13: qty 100

## 2021-02-13 MED ORDER — HYDROMORPHONE HCL 1 MG/ML IJ SOLN
INTRAMUSCULAR | Status: DC | PRN
  Administered 2021-02-13: .4 mg via INTRAVENOUS
  Administered 2021-02-13: .2 mg via INTRAVENOUS
  Administered 2021-02-13: .4 mg via INTRAVENOUS
  Administered 2021-02-13 (×3): .2 mg via INTRAVENOUS

## 2021-02-13 MED ORDER — POLYETHYLENE GLYCOL 3350 17 G PO PACK
17.0000 g | PACK | Freq: Every day | ORAL | Status: DC | PRN
  Filled 2021-02-13: qty 1

## 2021-02-13 MED ORDER — SODIUM CHLORIDE 0.9 % IR SOLN
Status: DC | PRN
  Administered 2021-02-13: 1000 mL

## 2021-02-13 MED ORDER — PHENOL 1.4 % MT LIQD: 1.0000 | OROMUCOSAL | Status: DC | PRN

## 2021-02-13 MED ORDER — ACETAMINOPHEN 325 MG PO TABS
325.0000 mg | ORAL_TABLET | Freq: Four times a day (QID) | ORAL | Status: DC | PRN
  Administered 2021-02-16: 650 mg via ORAL
  Filled 2021-02-13: qty 2

## 2021-02-13 MED ORDER — LACTATED RINGERS IV SOLN: INTRAVENOUS | Status: DC

## 2021-02-13 MED ORDER — MIDAZOLAM HCL 2 MG/2ML IJ SOLN: 1.0000 mg | INTRAMUSCULAR | Status: DC

## 2021-02-13 MED ORDER — BUPIVACAINE-EPINEPHRINE (PF) 0.25% -1:200000 IJ SOLN
INTRAMUSCULAR | Status: AC
  Filled 2021-02-13: qty 30

## 2021-02-13 MED ORDER — DIPHENHYDRAMINE HCL 12.5 MG/5ML PO ELIX: 12.5000 mg | ORAL_SOLUTION | ORAL | Status: DC | PRN

## 2021-02-13 MED ORDER — ALBUMIN HUMAN 5 % IV SOLN: INTRAVENOUS | Status: DC | PRN

## 2021-02-13 MED ORDER — METHOCARBAMOL 500 MG IVPB - SIMPLE MED
500.0000 mg | Freq: Four times a day (QID) | INTRAVENOUS | Status: DC | PRN
  Filled 2021-02-13: qty 50

## 2021-02-13 MED ORDER — SODIUM CHLORIDE (PF) 0.9 % IJ SOLN
INTRAMUSCULAR | Status: AC
  Filled 2021-02-13: qty 30

## 2021-02-13 MED ORDER — HYDROMORPHONE HCL 1 MG/ML IJ SOLN
0.5000 mg | INTRAMUSCULAR | Status: DC | PRN
  Administered 2021-02-14: 1 mg via INTRAVENOUS
  Filled 2021-02-13: qty 1

## 2021-02-13 MED ORDER — ONDANSETRON HCL 4 MG/2ML IJ SOLN: 4.0000 mg | Freq: Once | INTRAMUSCULAR | Status: DC | PRN

## 2021-02-13 MED ORDER — ASPIRIN 81 MG PO CHEW
81.0000 mg | CHEWABLE_TABLET | Freq: Two times a day (BID) | ORAL | Status: DC
  Administered 2021-02-13 – 2021-02-16 (×6): 81 mg via ORAL
  Filled 2021-02-13 (×6): qty 1

## 2021-02-13 MED ORDER — ALBUMIN HUMAN 5 % IV SOLN
INTRAVENOUS | Status: AC
  Filled 2021-02-13: qty 250

## 2021-02-13 MED ORDER — ACETAMINOPHEN 10 MG/ML IV SOLN
1000.0000 mg | Freq: Once | INTRAVENOUS | Status: AC
  Administered 2021-02-13: 1000 mg via INTRAVENOUS
  Filled 2021-02-13: qty 100

## 2021-02-13 MED ORDER — ONDANSETRON HCL 4 MG/2ML IJ SOLN: 4.0000 mg | Freq: Four times a day (QID) | INTRAMUSCULAR | Status: DC | PRN

## 2021-02-13 MED ORDER — METHOCARBAMOL 500 MG PO TABS
500.0000 mg | ORAL_TABLET | Freq: Four times a day (QID) | ORAL | Status: DC | PRN
  Administered 2021-02-13 – 2021-02-16 (×6): 500 mg via ORAL
  Filled 2021-02-13 (×6): qty 1

## 2021-02-13 MED ORDER — SODIUM CHLORIDE (PF) 0.9 % IJ SOLN
INTRAMUSCULAR | Status: AC
  Filled 2021-02-13: qty 10

## 2021-02-13 MED ORDER — ALUM & MAG HYDROXIDE-SIMETH 200-200-20 MG/5ML PO SUSP: 30.0000 mL | ORAL | Status: DC | PRN

## 2021-02-13 MED ORDER — LIDOCAINE 2% (20 MG/ML) 5 ML SYRINGE
INTRAMUSCULAR | Status: AC
  Filled 2021-02-13: qty 5

## 2021-02-13 MED ORDER — DEXAMETHASONE SODIUM PHOSPHATE 10 MG/ML IJ SOLN
INTRAMUSCULAR | Status: AC
  Filled 2021-02-13: qty 1

## 2021-02-13 MED ORDER — MIDAZOLAM HCL 2 MG/2ML IJ SOLN
INTRAMUSCULAR | Status: AC
  Filled 2021-02-13: qty 2

## 2021-02-13 MED ORDER — FENTANYL CITRATE (PF) 100 MCG/2ML IJ SOLN
INTRAMUSCULAR | Status: AC
  Administered 2021-02-13: 50 ug via INTRAVENOUS
  Filled 2021-02-13: qty 2

## 2021-02-13 MED ORDER — MENTHOL 3 MG MT LOZG: 1.0000 | LOZENGE | OROMUCOSAL | Status: DC | PRN

## 2021-02-13 MED ORDER — BUPIVACAINE-EPINEPHRINE 0.25% -1:200000 IJ SOLN
INTRAMUSCULAR | Status: DC | PRN
  Administered 2021-02-13: 30 mL

## 2021-02-13 MED ORDER — ORAL CARE MOUTH RINSE: 15.0000 mL | Freq: Once | OROMUCOSAL | Status: AC

## 2021-02-13 MED ORDER — PROPOFOL 10 MG/ML IV BOLUS
INTRAVENOUS | Status: DC | PRN
  Administered 2021-02-13: 150 mg via INTRAVENOUS
  Administered 2021-02-13: 50 mg via INTRAVENOUS

## 2021-02-13 MED ORDER — ISOPROPYL ALCOHOL 70 % SOLN
Status: DC | PRN
  Administered 2021-02-13: 1 via TOPICAL

## 2021-02-13 MED ORDER — KETOROLAC TROMETHAMINE 30 MG/ML IJ SOLN
INTRAMUSCULAR | Status: AC
  Filled 2021-02-13: qty 1

## 2021-02-13 MED ORDER — HYDROMORPHONE HCL 2 MG/ML IJ SOLN
INTRAMUSCULAR | Status: AC
  Filled 2021-02-13: qty 1

## 2021-02-13 MED ORDER — ONDANSETRON HCL 4 MG/2ML IJ SOLN
INTRAMUSCULAR | Status: AC
  Filled 2021-02-13: qty 2

## 2021-02-13 SURGICAL SUPPLY — 83 items
ADH SKN CLS APL DERMABOND .7 (GAUZE/BANDAGES/DRESSINGS) ×1
APL PRP STRL LF DISP 70% ISPRP (MISCELLANEOUS) ×2
BAG SPEC THK2 15X12 ZIP CLS (MISCELLANEOUS)
BAG ZIPLOCK 12X15 (MISCELLANEOUS) IMPLANT
BASEPLATE TIBIA REVIS 6 R/L (Joint) IMPLANT
BATTERY INSTRU NAVIGATION (MISCELLANEOUS) ×6 IMPLANT
BLADE SAW RECIPROCATING 77.5 (BLADE) ×2 IMPLANT
BNDG ELASTIC 4X5.8 VLCR STR LF (GAUZE/BANDAGES/DRESSINGS) ×2 IMPLANT
BNDG ELASTIC 6X5.8 VLCR STR LF (GAUZE/BANDAGES/DRESSINGS) ×2 IMPLANT
BONE CEMENT SIMPLEX TOBRAMYCIN (Cement) ×6 IMPLANT
BSPLAT TIB 6 UNV CMNT TL STAB (Joint) ×1 IMPLANT
BTRY SRG DRVR LF (MISCELLANEOUS) ×3
CEMENT BONE SIMPLEX TOBRAMYCIN (Cement) IMPLANT
CHLORAPREP W/TINT 26 (MISCELLANEOUS) ×4 IMPLANT
COVER SURGICAL LIGHT HANDLE (MISCELLANEOUS) ×2 IMPLANT
COVER WAND RF STERILE (DRAPES) IMPLANT
CUFF TOURN SGL QUICK 34 (TOURNIQUET CUFF) ×2
CUFF TRNQT CYL 34X4.125X (TOURNIQUET CUFF) ×1 IMPLANT
DECANTER SPIKE VIAL GLASS SM (MISCELLANEOUS) ×4 IMPLANT
DERMABOND ADVANCED (GAUZE/BANDAGES/DRESSINGS) ×1
DERMABOND ADVANCED .7 DNX12 (GAUZE/BANDAGES/DRESSINGS) ×2 IMPLANT
DRAPE SHEET LG 3/4 BI-LAMINATE (DRAPES) ×6 IMPLANT
DRAPE U-SHAPE 47X51 STRL (DRAPES) ×2 IMPLANT
DRESSING AQUACEL AG SP 3.5X10 (GAUZE/BANDAGES/DRESSINGS) IMPLANT
DRSG AQUACEL AG ADV 3.5X10 (GAUZE/BANDAGES/DRESSINGS) ×2 IMPLANT
DRSG AQUACEL AG ADV 3.5X14 (GAUZE/BANDAGES/DRESSINGS) ×1 IMPLANT
DRSG AQUACEL AG SP 3.5X10 (GAUZE/BANDAGES/DRESSINGS) ×2
DRSG TEGADERM 4X4.75 (GAUZE/BANDAGES/DRESSINGS) IMPLANT
ELECT BLADE TIP CTD 4 INCH (ELECTRODE) ×2 IMPLANT
ELECT REM PT RETURN 15FT ADLT (MISCELLANEOUS) ×2 IMPLANT
EVACUATOR 1/8 PVC DRAIN (DRAIN) ×1 IMPLANT
FEMORAL PEG DISTAL FIXATION (Orthopedic Implant) ×1 IMPLANT
FEMORAL POST SZ 6 (Orthopedic Implant) ×1 IMPLANT
GAUZE SPONGE 4X4 12PLY STRL (GAUZE/BANDAGES/DRESSINGS) ×2 IMPLANT
GLOVE SRG 8 PF TXTR STRL LF DI (GLOVE) ×2 IMPLANT
GLOVE SURG ENC MOIS LTX SZ8.5 (GLOVE) ×4 IMPLANT
GLOVE SURG ENC TEXT LTX SZ7.5 (GLOVE) ×6 IMPLANT
GLOVE SURG UNDER POLY LF SZ8 (GLOVE) ×4
GLOVE SURG UNDER POLY LF SZ8.5 (GLOVE) ×2 IMPLANT
GOWN SPEC L3 XXLG W/TWL (GOWN DISPOSABLE) ×2 IMPLANT
GOWN SPEC L4 XLG W/TWL (GOWN DISPOSABLE) ×2 IMPLANT
HANDPIECE INTERPULSE COAX TIP (DISPOSABLE) ×2
HOLDER FOLEY CATH W/STRAP (MISCELLANEOUS) ×2 IMPLANT
HOOD PEEL AWAY FLYTE STAYCOOL (MISCELLANEOUS) ×6 IMPLANT
INSERT TIB BEARING SZ6 9 (Miscellaneous) ×1 IMPLANT
JET LAVAGE IRRISEPT WOUND (IRRIGATION / IRRIGATOR)
KIT TURNOVER KIT A (KITS) ×2 IMPLANT
LAVAGE JET IRRISEPT WOUND (IRRIGATION / IRRIGATOR) IMPLANT
MARKER SKIN DUAL TIP RULER LAB (MISCELLANEOUS) ×2 IMPLANT
NDL SAFETY ECLIPSE 18X1.5 (NEEDLE) ×1 IMPLANT
NDL SPNL 18GX3.5 QUINCKE PK (NEEDLE) ×1 IMPLANT
NEEDLE HYPO 18GX1.5 SHARP (NEEDLE) ×2
NEEDLE SPNL 18GX3.5 QUINCKE PK (NEEDLE) ×2 IMPLANT
NS IRRIG 1000ML POUR BTL (IV SOLUTION) ×2 IMPLANT
PACK TOTAL KNEE CUSTOM (KITS) ×2 IMPLANT
PADDING CAST COTTON 6X4 STRL (CAST SUPPLIES) ×2 IMPLANT
PATELLA A 35MMX10MM (Knees) ×1 IMPLANT
PENCIL SMOKE EVACUATOR (MISCELLANEOUS) IMPLANT
PROTECTOR NERVE ULNAR (MISCELLANEOUS) ×2 IMPLANT
SAW OSC TIP CART 19.5X105X1.3 (SAW) ×2 IMPLANT
SEALER BIPOLAR AQUA 6.0 (INSTRUMENTS) ×2 IMPLANT
SET HNDPC FAN SPRY TIP SCT (DISPOSABLE) ×1 IMPLANT
SET PAD KNEE POSITIONER (MISCELLANEOUS) ×2 IMPLANT
SPONGE DRAIN TRACH 4X4 STRL 2S (GAUZE/BANDAGES/DRESSINGS) IMPLANT
STAPLER VISISTAT 35W (STAPLE) ×1 IMPLANT
STEM CEMENTED TRIATHLON (Stem) ×1 IMPLANT
SUT MNCRL AB 3-0 PS2 18 (SUTURE) ×2 IMPLANT
SUT MNCRL AB 4-0 PS2 18 (SUTURE) ×2 IMPLANT
SUT MON AB 2-0 CT1 36 (SUTURE) ×2 IMPLANT
SUT STRATAFIX PDO 1 14 VIOLET (SUTURE) ×2
SUT STRATFX PDO 1 14 VIOLET (SUTURE) ×1
SUT VIC AB 1 CTX 36 (SUTURE) ×4
SUT VIC AB 1 CTX36XBRD ANBCTR (SUTURE) ×2 IMPLANT
SUT VIC AB 2-0 CT1 27 (SUTURE) ×2
SUT VIC AB 2-0 CT1 TAPERPNT 27 (SUTURE) ×1 IMPLANT
SUTURE STRATFX PDO 1 14 VIOLET (SUTURE) ×1 IMPLANT
SYR 3ML LL SCALE MARK (SYRINGE) ×2 IMPLANT
TIB REV BASEPLATE 6 R/L (Joint) ×2 IMPLANT
TOWER CARTRIDGE SMART MIX (DISPOSABLE) IMPLANT
TRAY FOLEY MTR SLVR 16FR STAT (SET/KITS/TRAYS/PACK) IMPLANT
TUBE SUCTION HIGH CAP CLEAR NV (SUCTIONS) ×2 IMPLANT
WATER STERILE IRR 1000ML POUR (IV SOLUTION) ×4 IMPLANT
WRAP KNEE MAXI GEL POST OP (GAUZE/BANDAGES/DRESSINGS) ×2 IMPLANT

## 2021-02-13 NOTE — Anesthesia Procedure Notes (Addendum)
Procedure Name: LMA Insertion Date/Time: 02/13/2021 2:51 PM Performed by: Lissa Morales, CRNA Pre-anesthesia Checklist: Patient identified, Emergency Drugs available, Suction available and Patient being monitored Patient Re-evaluated:Patient Re-evaluated prior to induction Oxygen Delivery Method: Circle system utilized Preoxygenation: Pre-oxygenation with 100% oxygen Induction Type: IV induction LMA: LMA inserted LMA Size: 5.0 Tube type: Oral Number of attempts: 1 Placement Confirmation: positive ETCO2 and breath sounds checked- equal and bilateral Tube secured with: Tape Dental Injury: Teeth and Oropharynx as per pre-operative assessment  Comments: LMA by Zenia Resides crna

## 2021-02-13 NOTE — Anesthesia Preprocedure Evaluation (Addendum)
Anesthesia Evaluation  Patient identified by MRN, date of birth, ID band Patient awake    Reviewed: Allergy & Precautions, NPO status , Patient's Chart, lab work & pertinent test results  Airway Mallampati: IV  TM Distance: >3 FB Neck ROM: Full    Dental no notable dental hx.    Pulmonary neg pulmonary ROS,    Pulmonary exam normal breath sounds clear to auscultation       Cardiovascular hypertension, Pt. on medications Normal cardiovascular exam Rhythm:Regular Rate:Normal  ECG: SR, rate 89  ECHO: 1. Left ventricular ejection fraction, by estimation, is 55%. The left ventricle has normal function. The left ventricle demonstrates regional wall motion abnormalities - abnormal septal motion due to conduction delay. Left ventricular diastolic parameters are consistent with Grade I diastolic dysfunction (impaired relaxation). 2. Right ventricular systolic function is normal. The right ventricular size is normal. There is normal pulmonary artery systolic pressure. The estimated right ventricular systolic pressure is 81.4 mmHg. 3. The mitral valve is normal in structure. Trivial mitral valve regurgitation. No evidence of mitral stenosis. 4. The aortic valve is tricuspid. There is mild calcification of the aortic valve. Aortic valve regurgitation is not visualized. No aortic stenosis is present. 5. The inferior vena cava is normal in size with greater than 50% respiratory variability, suggesting right atrial pressure of 3 mmHg.   Neuro/Psych negative neurological ROS  negative psych ROS   GI/Hepatic negative GI ROS, Neg liver ROS,   Endo/Other  negative endocrine ROS  Renal/GU Renal disease     Musculoskeletal  (+) Arthritis , Gout   Abdominal   Peds  Hematology  (+) anemia ,   Anesthesia Other Findings left knee osteoarthritis  Reproductive/Obstetrics                            Anesthesia  Physical Anesthesia Plan  ASA: II  Anesthesia Plan: General and Regional   Post-op Pain Management: GA combined w/ Regional for post-op pain   Induction: Intravenous  PONV Risk Score and Plan: 2 and Ondansetron, Dexamethasone and Treatment may vary due to age or medical condition  Airway Management Planned: LMA  Additional Equipment:   Intra-op Plan:   Post-operative Plan: Extubation in OR  Informed Consent: I have reviewed the patients History and Physical, chart, labs and discussed the procedure including the risks, benefits and alternatives for the proposed anesthesia with the patient or authorized representative who has indicated his/her understanding and acceptance.     Dental advisory given  Plan Discussed with: CRNA  Anesthesia Plan Comments:         Anesthesia Quick Evaluation

## 2021-02-13 NOTE — Discharge Instructions (Signed)
Dr. Rod Can Total Joint Specialist [facility] [address], [place], [place] [address] [phone]  TOTAL KNEE REPLACEMENT POSTOPERATIVE DIRECTIONS    Knee Rehabilitation, Guidelines Following Surgery  Results after knee surgery are often greatly improved when you follow the exercise, range of motion and muscle strengthening exercises prescribed by your doctor. Safety measures are also important to protect the knee from further injury. Any time any of these exercises cause you to have increased pain or swelling in your knee joint, decrease the amount until you are comfortable again and slowly increase them. If you have problems or questions, call your caregiver or physical therapist for advice.   WEIGHT BEARING Weight bearing as tolerated with assist device (walker, cane, etc) as directed, use it as long as suggested by your surgeon or therapist, typically at least 4-6 weeks.  HOME CARE INSTRUCTIONS  . Remove items at home which could result in a fall. This includes throw rugs or furniture in walking pathways.  . Continue medications as instructed at time of discharge. . You may have some home medications which will be placed on hold until you complete the course of blood thinner medication.  . You may start showering once you are discharged home but do not submerge the incision under water. Just pat the incision dry and apply a dry gauze dressing on daily. . Walk with walker as instructed.  . You may resume a sexual relationship in one month or when given the OK by your doctor.   Use walker as long as suggested by your caregivers.  Avoid periods of inactivity such as sitting longer than an hour when not asleep. This helps prevent blood clots.  . You may put full weight on your legs and walk as much as is comfortable.  . You may return to work once you are cleared by your doctor.  . Do not drive a car for 6 weeks or until released by you surgeon.    Do not drive while taking narcotics.  . Wear the elastic stockings for three weeks following surgery during the day but you may remove then at night. . Make sure you keep all of your appointments after your operation with all of your doctors and caregivers. You should call the office at the above phone number and make an appointment for approximately two weeks after the date of your surgery. . Do not remove your surgical dressing. The dressing is waterproof; you may take showers in 3 days, but do not take tub baths or submerge the dressing. . Please pick up a stool softener and laxative for home use as long as you are requiring pain medications.  ICE to the affected knee every three hours for 30 minutes at a time and then as needed for pain and swelling.  Continue to use ice on the knee for pain and swelling from surgery. You may notice swelling that will progress down to the foot and ankle.  This is normal after surgery.  Elevate the leg when you are not up walking on it.   . It is important for you to complete the blood thinner medication as prescribed by your doctor.  Continue to use the breathing machine which will help keep your temperature down.  It is common for your temperature to cycle up and down following surgery, especially at night when you are not up moving around and exerting yourself.  The breathing machine keeps your lungs expanded and your temperature down.  RANGE OF MOTION AND STRENGTHENING EXERCISES  Rehabilitation of the knee is important following a knee injury or an operation. After just a few days of immobilization, the muscles of the thigh which control the knee become weakened and shrink (atrophy). Knee exercises are designed to build up the tone and strength of the thigh muscles and to improve knee motion. Often times heat used for twenty to thirty minutes before working out will loosen up your tissues and help with improving the range of motion but do not use heat for the  first two weeks following surgery. These exercises can be done on a training (exercise) mat, on the floor, on a table or on a bed. Use what ever works the best and is most comfortable for you Knee exercises include:  . Leg Lifts - While your knee is still immobilized in a splint or cast, you can do straight leg raises. Lift the leg to 60 degrees, hold for 3 sec, and slowly lower the leg. Repeat 10-20 times 2-3 times daily. Perform this exercise against resistance later as your knee gets better.  Javier Docker and Hamstring Sets - Tighten up the muscle on the front of the thigh (Quad) and hold for 5-10 sec. Repeat this 10-20 times hourly. Hamstring sets are done by pushing the foot backward against an object and holding for 5-10 sec. Repeat as with quad sets.  A rehabilitation program following serious knee injuries can speed recovery and prevent re-injury in the future due to weakened muscles. Contact your doctor or a physical therapist for more information on knee rehabilitation.   INSTRUCTIONS AFTER JOINT REPLACEMENT   o Remove items at home which could result in a fall. This includes throw rugs or furniture in walking pathways o ICE to the affected joint every three hours while awake for 30 minutes at a time, for at least the first 3-5 days, and then as needed for pain and swelling.  Continue to use ice for pain and swelling. You may notice swelling that will progress down to the foot and ankle.  This is normal after surgery.  Elevate your leg when you are not up walking on it.   o Continue to use the breathing machine you got in the hospital (incentive spirometer) which will help keep your temperature down.  It is common for your temperature to cycle up and down following surgery, especially at night when you are not up moving around and exerting yourself.  The breathing machine keeps your lungs expanded and your temperature down.   DIET:  As you were doing prior to hospitalization, we recommend a  well-balanced diet.  DRESSING / WOUND CARE / SHOWERING  Keep the surgical dressing until follow up.  The dressing is water proof, so you can shower without any extra covering.  IF THE DRESSING FALLS OFF or the wound gets wet inside, change the dressing with sterile gauze.  Please use good hand washing techniques before changing the dressing.  Do not use any lotions or creams on the incision until instructed by your surgeon.    ACTIVITY  o Increase activity slowly as tolerated, but follow the weight bearing instructions below.   o No driving for 6 weeks or until further direction given by your physician.  You cannot drive while taking narcotics.  o No lifting or carrying greater than 10 lbs. until further directed by your surgeon. o Avoid periods of inactivity such as sitting longer than an hour when not asleep. This helps prevent  blood clots.  o You may return to work once you are authorized by your doctor.     WEIGHT BEARING   Weight bearing as tolerated with assist device (walker, cane, etc) as directed, use it as long as suggested by your surgeon or therapist, typically at least 4-6 weeks.   EXERCISES  Results after joint replacement surgery are often greatly improved when you follow the exercise, range of motion and muscle strengthening exercises prescribed by your doctor. Safety measures are also important to protect the joint from further injury. Any time any of these exercises cause you to have increased pain or swelling, decrease what you are doing until you are comfortable again and then slowly increase them. If you have problems or questions, call your caregiver or physical therapist for advice.   Rehabilitation is important following a joint replacement. After just a few days of immobilization, the muscles of the leg can become weakened and shrink (atrophy).  These exercises are designed to build up the tone and strength of the thigh and leg muscles and to improve motion. Often  times heat used for twenty to thirty minutes before working out will loosen up your tissues and help with improving the range of motion but do not use heat for the first two weeks following surgery (sometimes heat can increase post-operative swelling).   These exercises can be done on a training (exercise) mat, on the floor, on a table or on a bed. Use whatever works the best and is most comfortable for you.    Use music or television while you are exercising so that the exercises are a pleasant break in your day. This will make your life better with the exercises acting as a break in your routine that you can look forward to.   Perform all exercises about fifteen times, three times per day or as directed.  You should exercise both the operative leg and the other leg as well.  Exercises include:   . Quad Sets - Tighten up the muscle on the front of the thigh (Quad) and hold for 5-10 seconds.   . Straight Leg Raises - With your knee straight (if you were given a brace, keep it on), lift the leg to 60 degrees, hold for 3 seconds, and slowly lower the leg.  Perform this exercise against resistance later as your leg gets stronger.  . Leg Slides: Lying on your back, slowly slide your foot toward your buttocks, bending your knee up off the floor (only go as far as is comfortable). Then slowly slide your foot back down until your leg is flat on the floor again.  Glenard Haring Wings: Lying on your back spread your legs to the side as far apart as you can without causing discomfort.  . Hamstring Strength:  Lying on your back, push your heel against the floor with your leg straight by tightening up the muscles of your buttocks.  Repeat, but this time bend your knee to a comfortable angle, and push your heel against the floor.  You may put a pillow under the heel to make it more comfortable if necessary.   A rehabilitation program following joint replacement surgery can speed recovery and prevent re-injury in the future  due to weakened muscles. Contact your doctor or a physical therapist for more information on knee rehabilitation.    CONSTIPATION  Constipation is defined medically as fewer than three stools per week and severe constipation as less than one stool per week.  Even if you have a regular bowel pattern at home, your normal regimen is likely to be disrupted due to multiple reasons following surgery.  Combination of anesthesia, postoperative narcotics, change in appetite and fluid intake all can affect your bowels.   YOU MUST use at least one of the following options; they are listed in order of increasing strength to get the job done.  They are all available over the counter, and you may need to use some, POSSIBLY even all of these options:    Drink plenty of fluids (prune juice may be helpful) and high fiber foods Colace 100 mg by mouth twice a day  Senokot for constipation as directed and as needed Dulcolax (bisacodyl), take with full glass of water  Miralax (polyethylene glycol) once or twice a day as needed.  If you have tried all these things and are unable to have a bowel movement in the first 3-4 days after surgery call either your surgeon or your primary doctor.    If you experience loose stools or diarrhea, hold the medications until you stool forms back up.  If your symptoms do not get better within 1 week or if they get worse, check with your doctor.  If you experience "the worst abdominal pain ever" or develop nausea or vomiting, please contact the office immediately for further recommendations for treatment.   ITCHING:  If you experience itching with your medications, try taking only a single pain pill, or even half a pain pill at a time.  You can also use Benadryl over the counter for itching or also to help with sleep.   TED HOSE STOCKINGS:  Use stockings on both legs until for at least 2 weeks or as directed by physician office. They may be removed at night for  sleeping.  MEDICATIONS:  See your medication summary on the "After Visit Summary" that nursing will review with you.  You may have some home medications which will be placed on hold until you complete the course of blood thinner medication.  It is important for you to complete the blood thinner medication as prescribed.  PRECAUTIONS:  If you experience chest pain or shortness of breath - call 911 immediately for transfer to the hospital emergency department.   If you develop a fever greater that 101 F, purulent drainage from wound, increased redness or drainage from wound, foul odor from the wound/dressing, or calf pain - CONTACT YOUR SURGEON.                                                   FOLLOW-UP APPOINTMENTS:  If you do not already have a post-op appointment, please call the office for an appointment to be seen by your surgeon.  Guidelines for how soon to be seen are listed in your "After Visit Summary", but are typically between 1-4 weeks after surgery.  OTHER INSTRUCTIONS:   Knee Replacement:  Do not place pillow under knee, focus on keeping the knee straight while resting. CPM instructions: 0-90 degrees, 2 hours in the morning, 2 hours in the afternoon, and 2 hours in the evening. Place foam block, curve side up under heel at all times except when in CPM or when walking.  DO NOT modify, tear, cut, or change the foam block in any way.  POST-OPERATIVE OPIOID TAPER INSTRUCTIONS: . It is important  to wean off of your opioid medication as soon as possible. If you do not need pain medication after your surgery it is ok to stop day one. Marland Kitchen Opioids include: o Codeine, Hydrocodone(Norco, Vicodin), Oxycodone(Percocet, oxycontin) and hydromorphone amongst others.  . Long term and even short term use of opiods can cause: o Increased pain response o Dependence o Constipation o Depression o Respiratory depression o And more.  . Withdrawal symptoms can include o Flu like symptoms o Nausea,  vomiting o And more . Techniques to manage these symptoms o Hydrate well o Eat regular healthy meals o Stay active o Use relaxation techniques(deep breathing, meditating, yoga) . Do Not substitute Alcohol to help with tapering . If you have been on opioids for less than two weeks and do not have pain than it is ok to stop all together.  . Plan to wean off of opioids o This plan should start within one week post op of your joint replacement. o Maintain the same interval or time between taking each dose and first decrease the dose.  o Cut the total daily intake of opioids by one tablet each day o Next start to increase the time between doses. o The last dose that should be eliminated is the evening dose.     MAKE SURE YOU:  . Understand these instructions.  . Get help right away if you are not doing well or get worse.    Thank you for letting us be a part of your medical care team.  It is a privilege we respect greatly.  We hope these instructions will help you stay on track for a fast and full recovery!   SKILLED REHAB INSTRUCTIONS: If the patient is transferred to a skilled rehab facility following release from the hospital, a list of the current medications will be sent to the facility for the patient to continue.  When discharged from the skilled rehab facility, please have the facility set up the patient's [place] prior to being released. Also, the skilled facility will be responsible for providing the patient with their medications at time of release from the facility to include their pain medication, the muscle relaxants, and their blood thinner medication. If the patient is still at the rehab facility at time of the two week follow up appointment, the skilled rehab facility will also need to assist the patient in arranging follow up appointment in our office and any transportation needs.  MAKE SURE YOU:  . Understand these instructions.  . Will watch your  condition.  . Will get help right away if you are not doing well or get worse.    Pick up stool softner and laxative for home use following surgery while on pain medications. Do NOT remove your dressing. You may shower.  Do not take tub baths or submerge incision under water. May shower starting three days after surgery. Please use a clean towel to pat the incision dry following showers. Continue to use ice for pain and swelling after surgery. Do not use any lotions or creams on the incision until instructed by your surgeon.

## 2021-02-13 NOTE — Anesthesia Procedure Notes (Signed)
Anesthesia Regional Block: Adductor canal block   Pre-Anesthetic Checklist: ,, timeout performed, Correct Patient, Correct Site, Correct Laterality, Correct Procedure,, site marked, risks and benefits discussed, Surgical consent,  Pre-op evaluation,  At surgeon's request and post-op pain management  Laterality: Left  Prep: chloraprep       Needles:  Injection technique: Single-shot  Needle Type: Echogenic Stimulator Needle     Needle Length: 9cm  Needle Gauge: 21     Additional Needles:   Procedures:,,,, ultrasound used (permanent image in chart),,,,  Narrative:  Start time: 02/13/2021 12:35 PM End time: 02/13/2021 12:45 PM Injection made incrementally with aspirations every 5 mL.  Performed by: Personally  Anesthesiologist: Murvin Natal, MD  Additional Notes: Functioning IV was confirmed and monitors were applied. A time-out was performed. Hand hygiene and sterile gloves were used. The thigh was placed in a frog-leg position and prepped in a sterile fashion. A 75mm 21ga Arrow echogenic stimulator needle was placed using ultrasound guidance.  Negative aspiration and negative test dose prior to incremental administration of local anesthetic. The patient tolerated the procedure well.

## 2021-02-13 NOTE — Anesthesia Postprocedure Evaluation (Signed)
Anesthesia Post Note  Patient: Scott Ransom Sr.  Procedure(s) Performed: COMPUTER ASSISTED TOTAL KNEE ARTHROPLASTY (Left Knee)     Patient location during evaluation: PACU Anesthesia Type: Regional and General Level of consciousness: awake Pain management: pain level controlled Vital Signs Assessment: post-procedure vital signs reviewed and stable Respiratory status: spontaneous breathing, nonlabored ventilation, respiratory function stable and patient connected to nasal cannula oxygen Cardiovascular status: blood pressure returned to baseline and stable Postop Assessment: no apparent nausea or vomiting Anesthetic complications: no   No complications documented.  Last Vitals:  Vitals:   02/13/21 1942 02/13/21 2109  BP: 133/74 120/76  Pulse: 74 87  Resp: 18 18  Temp: 36.6 C 36.7 C  SpO2: 100% 100%    Last Pain:  Vitals:   02/13/21 1946  TempSrc:   PainSc: 4                  Scott [name]

## 2021-02-13 NOTE — Interval H&P Note (Signed)
History and Physical Interval Note:  02/13/2021 12:58 PM  Scott Ransom Sr.  has presented today for surgery, with the diagnosis of left knee osteoarthritis.  The various methods of treatment have been discussed with the patient and family. After consideration of risks, benefits and other options for treatment, the patient has consented to  Procedure(s): COMPUTER ASSISTED TOTAL KNEE ARTHROPLASTY (Left) as a surgical intervention.  The patient's history has been reviewed, patient examined, no change in status, stable for surgery.  I have reviewed the patient's chart and labs.  Questions were answered to the patient's satisfaction.     Scott [name]

## 2021-02-13 NOTE — Op Note (Signed)
OPERATIVE REPORT  SURGEON: Rod Can, MD   ASSISTANT: Cherlynn June, PA-C.  PREOPERATIVE DIAGNOSIS: Left knee arthritis with medial compartment bone loss and severe varus deformity.   POSTOPERATIVE DIAGNOSIS: Left knee arthritis with medial compartment bone loss and severe varus deformity.   PROCEDURE: Left total knee arthroplasty.   IMPLANTS: Stryker Triathlon PS femur, size 6. Stryker universal tibia, size 6 with 12 x 50 mm cemented stem. X3 polyethelyene insert, size 9 mm, PS. 3 button asymmetric patella, size 35 mm. Simplex P bone cement.  ANESTHESIA:  General and Regional  TOURNIQUET TIME: 30 min at 250 mm Hg.  ESTIMATED BLOOD LOSS: -1700 mL  ANTIBIOTICS: 2g Ancef.  DRAINS: Medium HV x1.  COMPLICATIONS: None   CONDITION: PACU - hemodynamically stable.   BRIEF CLINICAL NOTE: Scott REKOWSKI Sr. is a 75 y.o. male with a long-standing history of Bilateral knee arthritis with medial tibial fragmentation and severe varus deformity. After failing conservative management, the patient was indicated for total knee arthroplasty. The risks, benefits, and alternatives to the procedure were explained, and the patient elected to proceed.  PROCEDURE IN DETAIL: Adductor canal block was obtained in the pre-op holding area by anesthesia. Once inside the operative room, general anesthesia was obtained, and a foley catheter was inserted. The patient was then positioned, a nonsterile tourniquet was placed, and the lower extremity was prepped and draped in the normal sterile surgical fashion. A time-out was called verifying side and site of surgery. The patient received IV antibiotics within 60 minutes of beginning the procedure.   An anterior approach to the knee was performed utilizing a midvastus arthrotomy. A medial release was performed and the patellar fat pad was excised. Stryker navigation was used to cut the distal femur  perpendicular to the mechanical axis. A freehand patellar resection was performed, and the patella was sized an prepared with 3 lug holes.  Nagivation was used to make a neutral proximal tibia resection, taking 9 mm of bone from the less affected lateral side with 0 degrees of slope. An uncontained defect of the far medial proximal tibia was present comprising less than 20% of the total surface. The menisci were excised. A spacer block was placed, and the alignment and balance in extension were confirmed.   The distal femur was sized using the 3-degree external rotation guide referencing the posterior femoral cortex. The appropriate 4-in-1 cutting block was pinned into place. Rotation was checked using Whiteside's line, the epicondylar axis, and then confirmed with a spacer block in flexion. The remaining femoral cuts were performed, taking care to protect the MCL. Box cut was made using the cutting guide.  The tibia was sized and the trial tray was pinned into place. The remaining trail components were inserted. The knee was stable to varus and valgus stress through a full range of motion. The patella tracked centrally. The trial components were removed, and the proximal tibial surface was prepared. I reamed for a 12 x 50 mm stem. The extremity was exsanguinated with an Esmarch, and the tourniquet was inflated. Small drill holes were made in the sclerotic subchondral bone.The cut bony surfaces were irrigated with pulse lavage. Final components were cemented into place and excess cement was cleared. The trial insert was placed, and the knee was brought into extension while the cement polymerized. Once the cement was hard, the knee was tested for a final time and found to be well balanced. The trial insert was exchanged for the real polyethylene insert.  The wound  was copiously irrigated with Irrisept solution followed by normal saline with pulse lavage. Marcaine solution was injected into the periarticular  soft tissue. The wound was closed in layers using #1 Vicryl and Stratafix for the fascia, 2-0 Vicryl for the subcutaneous fat, 2-0 Monocryl for the deep dermal layer, and staples + Dermabond for the skin. Once the glue was fully dried, an Aquacell Ag and compressive dressing were applied. The tourniquet was let down, and the patient was transported to the recovery room in stable ondition. Sponge, needle, and instrument counts were correct at the end of the case x2. The patient tolerated the procedure well and there were no known complications.  Please note that a surgical assistant was a medical necessity for this procedure in order to perform it in a safe and expeditious manner. Surgical assistant was necessary to retract the ligaments and vital neurovascular structures to prevent injury to them and also necessary for proper positioning of the limb to allow for anatomic placement of the prosthesis.

## 2021-02-13 NOTE — Progress Notes (Signed)
Assisted Dr. Ellender with left, ultrasound guided, adductor canal block. Side rails up, monitors on throughout procedure. See vital signs in flow sheet. Tolerated Procedure well.  

## 2021-02-13 NOTE — Transfer of Care (Signed)
Immediate Anesthesia Transfer of Care Note  Patient: Scott Ransom Sr.  Procedure(s) Performed: COMPUTER ASSISTED TOTAL KNEE ARTHROPLASTY (Left Knee)  Patient Location: PACU  Anesthesia Type:General  Level of Consciousness: awake, alert , oriented and patient cooperative  Airway & Oxygen Therapy: Patient Spontanous Breathing and Patient connected to face mask oxygen  Post-op Assessment: Report given to RN and Post -op Vital signs reviewed and stable  Post vital signs: stable  Last Vitals:  Vitals Value Taken Time  BP 137/66 02/13/21 1830  Temp    Pulse 87 02/13/21 1832  Resp 13 02/13/21 1832  SpO2 100 % 02/13/21 1832  Vitals shown include unvalidated device data.  Last Pain:  Vitals:   02/13/21 1244  TempSrc:   PainSc: 0-No pain      Patients Stated Pain Goal: 3 (96/11/64 3539)  Complications: No complications documented.

## 2021-02-14 DIAGNOSIS — M109 Gout, unspecified: Secondary | ICD-10-CM | POA: Diagnosis not present

## 2021-02-14 DIAGNOSIS — N183 Chronic kidney disease, stage 3 unspecified: Secondary | ICD-10-CM | POA: Diagnosis not present

## 2021-02-14 DIAGNOSIS — Z79899 Other long term (current) drug therapy: Secondary | ICD-10-CM | POA: Diagnosis not present

## 2021-02-14 DIAGNOSIS — E785 Hyperlipidemia, unspecified: Secondary | ICD-10-CM | POA: Diagnosis not present

## 2021-02-14 DIAGNOSIS — M1712 Unilateral primary osteoarthritis, left knee: Secondary | ICD-10-CM | POA: Diagnosis not present

## 2021-02-14 DIAGNOSIS — K219 Gastro-esophageal reflux disease without esophagitis: Secondary | ICD-10-CM | POA: Diagnosis not present

## 2021-02-14 DIAGNOSIS — D649 Anemia, unspecified: Secondary | ICD-10-CM | POA: Diagnosis not present

## 2021-02-14 DIAGNOSIS — Z791 Long term (current) use of non-steroidal anti-inflammatories (NSAID): Secondary | ICD-10-CM | POA: Diagnosis not present

## 2021-02-14 DIAGNOSIS — I129 Hypertensive chronic kidney disease with stage 1 through stage 4 chronic kidney disease, or unspecified chronic kidney disease: Secondary | ICD-10-CM | POA: Diagnosis not present

## 2021-02-14 LAB — CBC
HCT: 22.3 % — ABNORMAL LOW (ref 39.0–52.0)
Hemoglobin: 7.4 g/dL — ABNORMAL LOW (ref 13.0–17.0)
MCH: 28.9 pg (ref 26.0–34.0)
MCHC: 33.2 g/dL (ref 30.0–36.0)
MCV: 87.1 fL (ref 80.0–100.0)
Platelets: 141 10*3/uL — ABNORMAL LOW (ref 150–400)
RBC: 2.56 MIL/uL — ABNORMAL LOW (ref 4.22–5.81)
RDW: 14.8 % (ref 11.5–15.5)
WBC: 6.8 10*3/uL (ref 4.0–10.5)
nRBC: 0 % (ref 0.0–0.2)

## 2021-02-14 LAB — BASIC METABOLIC PANEL
Anion gap: 5 (ref 5–15)
BUN: 19 mg/dL (ref 8–23)
CO2: 27 mmol/L (ref 22–32)
Calcium: 8.7 mg/dL — ABNORMAL LOW (ref 8.9–10.3)
Chloride: 101 mmol/L (ref 98–111)
Creatinine, Ser: 1.2 mg/dL (ref 0.61–1.24)
GFR, Estimated: >60 mL/min (ref >60)
Glucose, Bld: 163 mg/dL — ABNORMAL HIGH (ref 70–99)
Potassium: 4.4 mmol/L (ref 3.5–5.1)
Sodium: 133 mmol/L — ABNORMAL LOW (ref 135–145)

## 2021-02-14 NOTE — TOC Transition Note (Signed)
Transition of Care [facility]) - CM/SW Discharge Note   Patient Details  Name: Scott [name]. MRN: [id number] Date of Birth: July 24, 1946  [facility] of [facility] CM/SW Contact:  Lennart Pall, LCSW Phone Number: 02/14/2021, 1:59 PM   Clinical Narrative:    Met with pt and confirming need for 3n1 commode - order placed with [employer] [facility].  Plan for OPPT at [facility].  No further TOC needs.   Final next level of care: OP Rehab Barriers to Discharge: No Barriers Identified   Patient Goals and CMS Choice Patient states their goals for this hospitalization and ongoing recovery are:: return home      Discharge Placement                       Discharge Plan and Services                DME Arranged: 3-N-1 DME Agency: [employer] Date DME Agency Contacted: 02/14/21 Time DME Agency Contacted: [phone] Representative spoke with at DME Agency: [place] Determinants of Health ([place]) Interventions     Readmission Risk Interventions No flowsheet data found.

## 2021-02-14 NOTE — Progress Notes (Signed)
Physical Therapy Treatment Patient Details Name: Scott HANK Sr. MRN: [id number] DOB: [birth year] Today's Date: 02/14/2021    History of Present Illness Pt s/p L TKR and with hx of BPPV, Bells palsy, and back surgery    PT Comments    Pt continues very motivated and with marked improvement this pm in activity tolerance and WB tolerance on L LE.  Pt hopeful for dc home tomorrow.  Pt's dtr present for session and very pleased with progress.[place] Pager [phone] Office [phone]   Follow Up Recommendations  Follow surgeon's recommendation for DC plan and follow-up therapies     Equipment Recommendations  3in1 (PT)    Recommendations for Other Services       Precautions / Restrictions Precautions Precautions: Knee;Fall Restrictions Weight Bearing Restrictions: No Other Position/Activity Restrictions: WBAT    Mobility  Bed Mobility Overal bed mobility: Needs Assistance Bed Mobility: Sit to Supine       Sit to supine: Min guard   General bed mobility comments: Pt self assisting L LE with gait belt    Transfers Overall transfer level: Needs assistance Equipment used: Rolling walker (2 wheeled) Transfers: Sit to/from Stand Sit to Stand: Min assist         General transfer comment: cues for LE management and use of UEs to self assist  Ambulation/Gait Ambulation/Gait assistance: Min assist Gait Distance (Feet): 110 Feet Assistive device: Rolling walker (2 wheeled) Gait Pattern/deviations: Step-to pattern;Decreased step length - right;Decreased step length - left;Shuffle;Trunk flexed Gait velocity: decr   General Gait Details: cues for sequence, posture and position from RW;   Stairs             Wheelchair Mobility    Modified Rankin (Stroke Patients Only)       Balance Overall balance assessment: Needs assistance Sitting-balance support: Feet supported;No upper extremity supported Sitting  balance-Leahy Scale: Good     Standing balance support: Bilateral upper extremity supported Standing balance-Leahy Scale: Poor                              Cognition Arousal/Alertness: Awake/alert Behavior During Therapy: WFL for tasks assessed/performed Overall Cognitive Status: Within Functional Limits for tasks assessed                                        Exercises Total Joint Exercises Ankle Circles/Pumps: AROM;Both;20 reps;Supine Quad Sets: AROM;Both;10 reps;Supine Gluteal Sets: PROM Heel Slides: AAROM;Left;15 reps;Supine Straight Leg Raises: AAROM;Left;10 reps;Supine    General Comments        Pertinent Vitals/Pain Pain Assessment: 0-10 Pain Score: 3  Pain Location: L knee Pain Descriptors / Indicators: Aching;Sore Pain Intervention(s): Limited activity within patient's tolerance;Monitored during session;Premedicated before session;Ice applied    Home Living                      Prior Function            PT Goals (current goals can now be found in the care plan section) Acute Rehab PT Goals Patient Stated Goal: Walk normally again PT Goal Formulation: With patient Time For Goal Achievement: 02/21/21 Potential to Achieve Goals: Good Progress towards PT goals: Progressing toward goals    Frequency    7X/week      PT Plan Current plan remains appropriate  Co-evaluation              AM-PAC PT "6 Clicks" Mobility   Outcome Measure  Help needed turning from your back to your side while in a flat bed without using bedrails?: A Little Help needed moving from lying on your back to sitting on the side of a flat bed without using bedrails?: A Little Help needed moving to and from a bed to a chair (including a wheelchair)?: A Little Help needed standing up from a chair using your arms (e.g., wheelchair or bedside chair)?: A Little Help needed to walk in hospital room?: A Little Help needed climbing 3-5 steps  with a railing? : A Lot 6 Click Score: 17    End of Session Equipment Utilized During Treatment: Gait belt Activity Tolerance: Patient tolerated treatment well Patient left: in chair;with call bell/phone within reach;with chair alarm set Nurse Communication: Mobility status PT Visit Diagnosis: Difficulty in walking, not elsewhere classified (R26.2)     Time: 3300-7622 PT Time Calculation (min) (ACUTE ONLY): 33 min  Charges:  $Gait Training: 8-22 mins $Therapeutic Exercise: 8-22 mins                     [place] Pager [phone] Office [phone]    Lyle Leisner 02/14/2021, 2:23 PM

## 2021-02-14 NOTE — Progress Notes (Signed)
    Subjective:  Patient reports pain as mild to moderate.  Denies N/V/CP/SOB.   Objective:   VITALS:   Vitals:   02/13/21 2109 02/14/21 0144 02/14/21 0529 02/14/21 1002  BP: 120/76 113/72 110/63 122/79  Pulse: 87 81 73 88  Resp: 18 18 18 17   Temp: 98 F (36.7 C) 97.7 F (36.5 C) 98.3 F (36.8 C) 98.2 F (36.8 C)  TempSrc: Oral Oral Oral Oral  SpO2: 100% 100% 100% 100%  Weight:      Height:        NAD ABD soft Neurovascular intact Sensation intact distally Intact pulses distally Dorsiflexion/Plantar flexion intact Incision: dressing C/D/I Hemovac in place   Lab Results  Component Value Date   WBC 6.8 02/14/2021   HGB 7.4 (L) 02/14/2021   HCT 22.3 (L) 02/14/2021   MCV 87.1 02/14/2021   PLT 141 (L) 02/14/2021   BMET    Component Value Date/Time   NA 133 (L) 02/14/2021 0254   K 4.4 02/14/2021 0254   CL 101 02/14/2021 0254   CO2 27 02/14/2021 0254   GLUCOSE 163 (H) 02/14/2021 0254   GLUCOSE 89 09/28/2006 1017   BUN 19 02/14/2021 0254   CREATININE 1.20 02/14/2021 0254   CREATININE 1.20 01/13/2021 1116   CREATININE 1.66 (H) 07/19/2020 1028   CALCIUM 8.7 (L) 02/14/2021 0254   GFRNONAA >60 02/14/2021 0254   GFRNONAA >60 01/13/2021 1116   GFRNONAA 39 (L) 07/19/2020 1028   GFRAA 46 (L) 07/19/2020 1028     Assessment/Plan: 1 Day Post-Op   Principal Problem:   Osteoarthritis of left knee Active Problems:   Degenerative arthritis of left knee   WBAT with walker DVT ppx: Aspirin, SCDs, TEDS PO pain control PT/OT ABLA: HgB 7.4.  Transfuse if below 7 D/C hemovac once output is below 30cc per shift Dispo: D/C home once cleared by therapy.  Outpatient PT.      Scott [name] 02/14/2021, 1:42 PM  [facility] is now [employer] [address] [place], [place], [place] [address] Phone: [phone] [email or web address] Facebook  [employer]

## 2021-02-14 NOTE — Evaluation (Signed)
Physical Therapy Evaluation Patient Details Name: Scott KOVALESKI Sr. MRN: [id number] DOB: January 06, 1946 Today's Date: 02/14/2021   History of Present Illness  Pt s/p L TKR and with hx of BPPV, Bells palsy, and back surgery  Clinical Impression  Pt s/p L TKR and presents with decreased L LE strength/ROM and post op pain limiting functional mobility.  Pt should progress to dc home with family assist.    Follow Up Recommendations Follow surgeon's recommendation for DC plan and follow-up therapies    Equipment Recommendations  3in1 (PT)    Recommendations for Other Services       Precautions / Restrictions Precautions Precautions: Knee;Fall Restrictions Weight Bearing Restrictions: No Other Position/Activity Restrictions: WBAT      Mobility  Bed Mobility Overal bed mobility: Needs Assistance Bed Mobility: Supine to Sit     Supine to sit: Min assist     General bed mobility comments: cues for sequence and assist to manage L LE    Transfers Overall transfer level: Needs assistance Equipment used: Rolling walker (2 wheeled) Transfers: Sit to/from Stand Sit to Stand: Min assist;Mod assist         General transfer comment: cues for LE management and use of UEs to self assist  Ambulation/Gait Ambulation/Gait assistance: Min assist;Mod assist Gait Distance (Feet): 18 Feet Assistive device: Rolling walker (2 wheeled) Gait Pattern/deviations: Step-to pattern;Decreased step length - right;Decreased step length - left;Shuffle;Trunk flexed Gait velocity: decr   General Gait Details: cues for sequence, posture and position from RW; pt struggling with L knee ext and tolerating min WB on L LE  Stairs            Wheelchair Mobility    Modified Rankin (Stroke Patients Only)       Balance Overall balance assessment: Needs assistance Sitting-balance support: Feet supported;No upper extremity supported Sitting balance-Leahy Scale: Good     Standing balance  support: Bilateral upper extremity supported Standing balance-Leahy Scale: Poor                               Pertinent Vitals/Pain Pain Assessment: 0-10 Pain Score: 3  Pain Location: L knee Pain Descriptors / Indicators: Aching;Sore Pain Intervention(s): Limited activity within patient's tolerance;Monitored during session;Premedicated before session    [place] expects to be discharged to:: Private residence Living Arrangements: Spouse/significant other;Children Available Help at Discharge: Family Type of Home: House Home Access: Stairs to enter Entrance Stairs-Rails: None Technical brewer of Steps: 2 Home Layout: One level Home Equipment: Environmental consultant - 2 wheels;Crutches      Prior Function Level of Independence: Independent with assistive device(s)         Comments: using crutch to mobilize; pt states he was min WB on L LE and "just dragging that leg along"     Hand Dominance        Extremity/Trunk Assessment   Upper Extremity Assessment Upper Extremity Assessment: Overall WFL for tasks assessed    Lower Extremity Assessment Lower Extremity Assessment: LLE deficits/detail LLE Deficits / Details: 2-/5 quads with AAROM at knee -10 - 95    Cervical / Trunk Assessment Cervical / Trunk Assessment: Normal  Communication   Communication: No difficulties  Cognition Arousal/Alertness: Awake/alert Behavior During Therapy: WFL for tasks assessed/performed Overall Cognitive Status: Within Functional Limits for tasks assessed  General Comments      Exercises Total Joint Exercises Ankle Circles/Pumps: AROM;Both;20 reps;Supine Quad Sets: AROM;Both;10 reps;Supine Heel Slides: AAROM;Left;15 reps;Supine Straight Leg Raises: AAROM;Left;10 reps;Supine   Assessment/Plan    PT Assessment Patient needs continued PT services  PT Problem List Decreased strength;Decreased range of  motion;Decreased activity tolerance;Decreased balance;Decreased mobility;Decreased knowledge of use of DME;Pain       PT Treatment Interventions DME instruction;Gait training;Stair training;Functional mobility training;Therapeutic activities;Therapeutic exercise;Patient/family education    PT Goals (Current goals can be found in the Care Plan section)  Acute Rehab PT Goals Patient Stated Goal: Walk normally again PT Goal Formulation: With patient Time For Goal Achievement: 02/21/21 Potential to Achieve Goals: Good    Frequency 7X/week   Barriers to discharge        Co-evaluation               AM-PAC PT "6 Clicks" Mobility  Outcome Measure Help needed turning from your back to your side while in a flat bed without using bedrails?: A Little Help needed moving from lying on your back to sitting on the side of a flat bed without using bedrails?: A Little Help needed moving to and from a bed to a chair (including a wheelchair)?: A Lot Help needed standing up from a chair using your arms (e.g., wheelchair or bedside chair)?: A Lot Help needed to walk in hospital room?: A Lot Help needed climbing 3-5 steps with a railing? : A Lot 6 Click Score: 14    End of Session Equipment Utilized During Treatment: Gait belt Activity Tolerance: Patient tolerated treatment well;Patient limited by fatigue Patient left: in chair;with call bell/phone within reach;with chair alarm set Nurse Communication: Mobility status PT Visit Diagnosis: Difficulty in walking, not elsewhere classified (R26.2)    Time: 3016-0109 PT Time Calculation (min) (ACUTE ONLY): 45 min   Charges:   PT Evaluation $PT Eval Low Complexity: 1 Low PT Treatments $Gait Training: 8-22 mins $Therapeutic Exercise: 8-22 mins        [place] Pager [phone] Office [phone]   Prestyn Stanco 02/14/2021, 9:35 AM

## 2021-02-14 NOTE — Plan of Care (Signed)
  Problem: Pain Management: Goal: Pain level will decrease with appropriate interventions Outcome: Progressing   Problem: Education: Goal: Knowledge of General Education information will improve Description: Including pain rating scale, medication(s)/side effects and non-pharmacologic comfort measures Outcome: Progressing   Problem: Health Behavior/Discharge Planning: Goal: Ability to manage health-related needs will improve Outcome: Progressing   Problem: Clinical Measurements: Goal: Ability to maintain clinical measurements within normal limits will improve Outcome: Progressing Goal: Will remain free from infection Outcome: Progressing

## 2021-02-15 DIAGNOSIS — I129 Hypertensive chronic kidney disease with stage 1 through stage 4 chronic kidney disease, or unspecified chronic kidney disease: Secondary | ICD-10-CM | POA: Diagnosis not present

## 2021-02-15 DIAGNOSIS — E785 Hyperlipidemia, unspecified: Secondary | ICD-10-CM | POA: Diagnosis not present

## 2021-02-15 DIAGNOSIS — M1712 Unilateral primary osteoarthritis, left knee: Secondary | ICD-10-CM | POA: Diagnosis not present

## 2021-02-15 DIAGNOSIS — M109 Gout, unspecified: Secondary | ICD-10-CM | POA: Diagnosis not present

## 2021-02-15 DIAGNOSIS — N183 Chronic kidney disease, stage 3 unspecified: Secondary | ICD-10-CM | POA: Diagnosis not present

## 2021-02-15 DIAGNOSIS — Z791 Long term (current) use of non-steroidal anti-inflammatories (NSAID): Secondary | ICD-10-CM | POA: Diagnosis not present

## 2021-02-15 DIAGNOSIS — D649 Anemia, unspecified: Secondary | ICD-10-CM | POA: Diagnosis not present

## 2021-02-15 DIAGNOSIS — K219 Gastro-esophageal reflux disease without esophagitis: Secondary | ICD-10-CM | POA: Diagnosis not present

## 2021-02-15 DIAGNOSIS — Z79899 Other long term (current) drug therapy: Secondary | ICD-10-CM | POA: Diagnosis not present

## 2021-02-15 LAB — CBC
HCT: 18.3 % — ABNORMAL LOW (ref 39.0–52.0)
Hemoglobin: 6 g/dL — CL (ref 13.0–17.0)
MCH: 28.4 pg (ref 26.0–34.0)
MCHC: 32.8 g/dL (ref 30.0–36.0)
MCV: 86.7 fL (ref 80.0–100.0)
Platelets: 136 10*3/uL — ABNORMAL LOW (ref 150–400)
RBC: 2.11 MIL/uL — ABNORMAL LOW (ref 4.22–5.81)
RDW: 14.9 % (ref 11.5–15.5)
WBC: 11.5 10*3/uL — ABNORMAL HIGH (ref 4.0–10.5)
nRBC: 0 % (ref 0.0–0.2)

## 2021-02-15 LAB — HEMOGLOBIN AND HEMATOCRIT, BLOOD
HCT: 25.4 % — ABNORMAL LOW (ref 39.0–52.0)
Hemoglobin: 8.4 g/dL — ABNORMAL LOW (ref 13.0–17.0)

## 2021-02-15 MED ORDER — SODIUM CHLORIDE 0.9% IV SOLUTION: Freq: Once | INTRAVENOUS | Status: AC

## 2021-02-15 MED ORDER — ACETAMINOPHEN 325 MG PO TABS: 325.0000 mg | ORAL_TABLET | Freq: Four times a day (QID) | ORAL | 0 refills | Status: AC | PRN

## 2021-02-15 MED ORDER — OXYCODONE HCL 5 MG PO TABS: 5.0000 mg | ORAL_TABLET | ORAL | 0 refills | Status: DC | PRN

## 2021-02-15 MED ORDER — DOCUSATE SODIUM 100 MG PO CAPS: 100.0000 mg | ORAL_CAPSULE | Freq: Two times a day (BID) | ORAL | 0 refills | Status: DC

## 2021-02-15 MED ORDER — ONDANSETRON HCL 4 MG PO TABS: 4.0000 mg | ORAL_TABLET | Freq: Four times a day (QID) | ORAL | 0 refills | Status: DC | PRN

## 2021-02-15 MED ORDER — ASPIRIN 81 MG PO CHEW: 81.0000 mg | CHEWABLE_TABLET | Freq: Two times a day (BID) | ORAL | 0 refills | Status: AC

## 2021-02-15 MED ORDER — SENNA 8.6 MG PO TABS: 1.0000 | ORAL_TABLET | Freq: Two times a day (BID) | ORAL | 0 refills | Status: DC

## 2021-02-15 NOTE — TOC Progression Note (Signed)
Transition of Care (TOC) - Progression Note    Patient Details  Name: Scott TUBERVILLE Sr. MRN: [id number] Date of Birth: 1946/04/30  Transition of Care [facility]) CM/SW Contact  Joaquin Courts, RN Phone Number: 02/15/2021, 9:43 AM  Clinical Narrative:    Patient plans to dc home with OPPT, [employer] to deliver rolling walker to bedside for home use.  Patient reports has already received 3in1.   Expected Discharge Plan: Home/Self Care Barriers to Discharge: No Barriers Identified  Expected Discharge Plan and Services Expected Discharge Plan: Home/Self Care                         DME Arranged: Walker rolling DME Agency: Other - Comment [employer]) Date DME Agency Contacted: 02/15/21 Time DME Agency Contacted: [phone] Representative spoke with at DME Agency: [facility]             Social Determinants of Health ([place]) Interventions    Readmission Risk Interventions No flowsheet data found.

## 2021-02-15 NOTE — Progress Notes (Signed)
Physical Therapy Treatment Patient Details Name: Scott PAYSON Sr. MRN: [id number] DOB: [birth year] Today's Date: 02/15/2021    History of Present Illness Pt s/p L TKR and with hx of BPPV, Bells palsy, and back surgery    PT Comments    Pt continues very motivated.  Pt performed therex program with assist.  OOB deferred this am - pt Hgb 6.0 and transfusion in progress.   Follow Up Recommendations  Follow surgeon's recommendation for DC plan and follow-up therapies     Equipment Recommendations  3in1 (PT)    Recommendations for Other Services       Precautions / Restrictions Precautions Precautions: Knee;Fall Restrictions Weight Bearing Restrictions: No Other Position/Activity Restrictions: WBAT    Mobility  Bed Mobility               General bed mobility comments: OOB deferred - pt on bedrest until 2 units RBC tranfused    Transfers                    Ambulation/Gait                 Stairs             Wheelchair Mobility    Modified Rankin (Stroke Patients Only)       Balance                                            Cognition Arousal/Alertness: Awake/alert Behavior During Therapy: WFL for tasks assessed/performed Overall Cognitive Status: Within Functional Limits for tasks assessed                                        Exercises Total Joint Exercises Ankle Circles/Pumps: AROM;Both;20 reps;Supine Quad Sets: AROM;Both;Supine;15 reps Heel Slides: AAROM;Left;Supine;20 reps Straight Leg Raises: AAROM;Left;Supine;20 reps Goniometric ROM: AAROM L knee -8 - 90    General Comments        Pertinent Vitals/Pain Pain Assessment: 0-10 Pain Score: 4  Pain Location: L knee Pain Descriptors / Indicators: Aching;Sore Pain Intervention(s): Limited activity within patient's tolerance;Monitored during session;Premedicated before session;Ice applied    Home Living                       Prior Function            PT Goals (current goals can now be found in the care plan section) Acute Rehab PT Goals Patient Stated Goal: Walk normally again PT Goal Formulation: With patient Time For Goal Achievement: 02/21/21 Potential to Achieve Goals: Good Progress towards PT goals: Progressing toward goals    Frequency    7X/week      PT Plan Current plan remains appropriate    Co-evaluation              AM-PAC PT "6 Clicks" Mobility   Outcome Measure  Help needed turning from your back to your side while in a flat bed without using bedrails?: A Little Help needed moving from lying on your back to sitting on the side of a flat bed without using bedrails?: A Little Help needed moving to and from a bed to a chair (including a wheelchair)?: A Little Help needed standing up from a chair using your arms (e.g., wheelchair  or bedside chair)?: A Little Help needed to walk in hospital room?: A Little Help needed climbing 3-5 steps with a railing? : A Lot 6 Click Score: 17    End of Session   Activity Tolerance: Patient tolerated treatment well Patient left: in bed;with call bell/phone within reach;with bed alarm set Nurse Communication: Mobility status PT Visit Diagnosis: Difficulty in walking, not elsewhere classified (R26.2)     Time: 1580-6386 PT Time Calculation (min) (ACUTE ONLY): 20 min  Charges:  $Therapeutic Exercise: 8-22 mins                     Debe Coder PT Acute Rehabilitation Services Pager [phone] Office [phone]    Scott [name] 02/15/2021, 8:31 AM

## 2021-02-15 NOTE — Progress Notes (Signed)
    Subjective:  Patient reports pain as mild to moderate.  Denies N/V/CP/SOB.   Objective:   VITALS:   Vitals:   02/14/21 2028 02/15/21 0538 02/15/21 0614 02/15/21 0718  BP: 140/66 121/61 (!) 133/57 131/61  Pulse: 88 83 85 86  Resp: 16 18 16 18   Temp: 98 F (36.7 C) 98.9 F (37.2 C) 98.4 F (36.9 C) 98.6 F (37 C)  TempSrc: Oral Oral Oral Oral  SpO2: 100% 100% 100% 100%  Weight:      Height:        NAD ABD soft Neurovascular intact Sensation intact distally Intact pulses distally Dorsiflexion/Plantar flexion intact Incision: dressing C/D/I Hemovac in place   Lab Results  Component Value Date   WBC 11.5 (H) 02/15/2021   HGB 6.0 (LL) 02/15/2021   HCT 18.3 (L) 02/15/2021   MCV 86.7 02/15/2021   PLT 136 (L) 02/15/2021   BMET    Component Value Date/Time   NA 133 (L) 02/14/2021 0254   K 4.4 02/14/2021 0254   CL 101 02/14/2021 0254   CO2 27 02/14/2021 0254   GLUCOSE 163 (H) 02/14/2021 0254   GLUCOSE 89 09/28/2006 1017   BUN 19 02/14/2021 0254   CREATININE 1.20 02/14/2021 0254   CREATININE 1.20 01/13/2021 1116   CREATININE 1.66 (H) 07/19/2020 1028   CALCIUM 8.7 (L) 02/14/2021 0254   GFRNONAA >60 02/14/2021 0254   GFRNONAA >60 01/13/2021 1116   GFRNONAA 39 (L) 07/19/2020 1028   GFRAA 46 (L) 07/19/2020 1028     Assessment/Plan: 2 Days Post-Op   Principal Problem:   Osteoarthritis of left knee Active Problems:   Degenerative arthritis of left knee   WBAT with walker DVT ppx: Aspirin, SCDs, TEDS PO pain control PT/OT ABLA: HgB 6.0 this AM.  2 units PRBCs being transfused Hemovac removed this AM Dispo: D/C home once cleared by therapy, hopefully Sunday.  Outpatient PT.      Dorothyann Peng 02/15/2021, 8:02 AM  [facility] is now [employer] [address], [place], [place], [place] [address] Phone: [phone] [email or web address] Facebook  [employer]

## 2021-02-15 NOTE — Plan of Care (Signed)
  Problem: Education: Goal: Knowledge of the prescribed therapeutic regimen will improve Outcome: Progressing   Problem: Activity: Goal: Ability to avoid complications of mobility impairment will improve Outcome: Progressing   Problem: Pain Management: Goal: Pain level will decrease with appropriate interventions Outcome: Progressing   

## 2021-02-15 NOTE — Progress Notes (Signed)
Date and time results received: 02/15/21 03:43  Test: Hemoglobin Critical Value: 6.0  Name of Provider Notified: Jenetta Loges, PA  Orders Received? Or Actions Taken?: See new orders

## 2021-02-15 NOTE — Progress Notes (Signed)
Physical Therapy Treatment Patient Details Name: Scott MANGANARO Sr. MRN: [id number] DOB: February 14, 1946 Today's Date: 02/15/2021    History of Present Illness Pt s/p L TKR and with hx of BPPV, Bells palsy, and back surgery    PT Comments    Pt continues very motivated and feeling better after transfusion.  Pt hopeful for dc home tomorrow.   Follow Up Recommendations  Follow surgeon's recommendation for DC plan and follow-up therapies     Equipment Recommendations  3in1 (PT)    Recommendations for Other Services       Precautions / Restrictions Precautions Precautions: Knee;Fall Restrictions Weight Bearing Restrictions: No Other Position/Activity Restrictions: WBAT    Mobility  Bed Mobility Overal bed mobility: Needs Assistance Bed Mobility: Supine to Sit;Sit to Supine     Supine to sit: Min guard Sit to supine: Min guard   General bed mobility comments: cues for sequence and use of belt to assist L LE    Transfers Overall transfer level: Needs assistance Equipment used: Rolling walker (2 wheeled) Transfers: Sit to/from Stand Sit to Stand: Min guard         General transfer comment: cues for LE management and use of UEs to self assist  Ambulation/Gait Ambulation/Gait assistance: Min guard Gait Distance (Feet): 120 Feet Assistive device: Rolling walker (2 wheeled) Gait Pattern/deviations: Step-to pattern;Decreased step length - right;Decreased step length - left;Shuffle;Trunk flexed;Antalgic Gait velocity: decr   General Gait Details: cues for sequence, posture and position from RW;   Stairs             Wheelchair Mobility    Modified Rankin (Stroke Patients Only)       Balance Overall balance assessment: Needs assistance Sitting-balance support: Feet supported;No upper extremity supported Sitting balance-Leahy Scale: Good     Standing balance support: No upper extremity supported Standing balance-Leahy Scale: Fair                               Cognition Arousal/Alertness: Awake/alert Behavior During Therapy: WFL for tasks assessed/performed;Impulsive Overall Cognitive Status: Within Functional Limits for tasks assessed                                        Exercises      General Comments        Pertinent Vitals/Pain Pain Assessment: 0-10 Pain Score: 5  Pain Location: L knee Pain Descriptors / Indicators: Aching;Sore Pain Intervention(s): Limited activity within patient's tolerance;Monitored during session;Premedicated before session;Ice applied    Home Living                      Prior Function            PT Goals (current goals can now be found in the care plan section) Acute Rehab PT Goals Patient Stated Goal: Walk normally again PT Goal Formulation: With patient Time For Goal Achievement: 02/21/21 Potential to Achieve Goals: Good Progress towards PT goals: Progressing toward goals    Frequency    7X/week      PT Plan Current plan remains appropriate    Co-evaluation              AM-PAC PT "6 Clicks" Mobility   Outcome Measure  Help needed turning from your back to your side while in a flat bed without using bedrails?: A  Little Help needed moving from lying on your back to sitting on the side of a flat bed without using bedrails?: A Little Help needed moving to and from a bed to a chair (including a wheelchair)?: A Little Help needed standing up from a chair using your arms (e.g., wheelchair or bedside chair)?: A Little Help needed to walk in hospital room?: A Little Help needed climbing 3-5 steps with a railing? : A Lot 6 Click Score: 17    End of Session Equipment Utilized During Treatment: Gait belt Activity Tolerance: Patient tolerated treatment well;Patient limited by pain Patient left: in bed;with call bell/phone within reach;with bed alarm set;with family/visitor present Nurse Communication: Mobility status PT Visit Diagnosis:  Difficulty in walking, not elsewhere classified (R26.2)     Time: 3507-5732 PT Time Calculation (min) (ACUTE ONLY): 23 min  Charges:  $Gait Training: 23-37 mins                     [place] Pager [phone] Office [phone]    Lanay Zinda 02/15/2021, 4:08 PM

## 2021-02-16 DIAGNOSIS — E785 Hyperlipidemia, unspecified: Secondary | ICD-10-CM | POA: Diagnosis not present

## 2021-02-16 DIAGNOSIS — Z791 Long term (current) use of non-steroidal anti-inflammatories (NSAID): Secondary | ICD-10-CM | POA: Diagnosis not present

## 2021-02-16 DIAGNOSIS — I129 Hypertensive chronic kidney disease with stage 1 through stage 4 chronic kidney disease, or unspecified chronic kidney disease: Secondary | ICD-10-CM | POA: Diagnosis not present

## 2021-02-16 DIAGNOSIS — N183 Chronic kidney disease, stage 3 unspecified: Secondary | ICD-10-CM | POA: Diagnosis not present

## 2021-02-16 DIAGNOSIS — Z79899 Other long term (current) drug therapy: Secondary | ICD-10-CM | POA: Diagnosis not present

## 2021-02-16 DIAGNOSIS — K219 Gastro-esophageal reflux disease without esophagitis: Secondary | ICD-10-CM | POA: Diagnosis not present

## 2021-02-16 DIAGNOSIS — D649 Anemia, unspecified: Secondary | ICD-10-CM | POA: Diagnosis not present

## 2021-02-16 DIAGNOSIS — M109 Gout, unspecified: Secondary | ICD-10-CM | POA: Diagnosis not present

## 2021-02-16 DIAGNOSIS — M1712 Unilateral primary osteoarthritis, left knee: Secondary | ICD-10-CM | POA: Diagnosis not present

## 2021-02-16 LAB — CBC
HCT: 25.3 % — ABNORMAL LOW (ref 39.0–52.0)
Hemoglobin: 8.5 g/dL — ABNORMAL LOW (ref 13.0–17.0)
MCH: 28.8 pg (ref 26.0–34.0)
MCHC: 33.6 g/dL (ref 30.0–36.0)
MCV: 85.8 fL (ref 80.0–100.0)
Platelets: 148 10*3/uL — ABNORMAL LOW (ref 150–400)
RBC: 2.95 MIL/uL — ABNORMAL LOW (ref 4.22–5.81)
RDW: 15 % (ref 11.5–15.5)
WBC: 12 10*3/uL — ABNORMAL HIGH (ref 4.0–10.5)
nRBC: 0 % (ref 0.0–0.2)

## 2021-02-16 LAB — BPAM RBC
Blood Product Expiration Date: 202205092359
Blood Product Expiration Date: 202205092359
ISSUE DATE / TIME: 202204160543
ISSUE DATE / TIME: 202204160856
Unit Type and Rh: 6200
Unit Type and Rh: 6200

## 2021-02-16 LAB — TYPE AND SCREEN
ABO/RH(D): A POS
Antibody Screen: NEGATIVE
Unit division: 0
Unit division: 0

## 2021-02-16 NOTE — Progress Notes (Signed)
   Subjective: 3 Days Post-Op Procedure(s) (LRB): COMPUTER ASSISTED TOTAL KNEE ARTHROPLASTY (Left) Patient reports pain as mild.   Plan is to go Home after hospital stay.  Objective: Vital signs in last 24 hours: Temp:  [98.6 F (37 C)-99 F (37.2 C)] 99 F (37.2 C) (04/17 0631) Pulse Rate:  [71-81] 71 (04/17 0631) Resp:  [16-18] 18 (04/17 0631) BP: (117-135)/(61-81) 132/61 (04/17 0631) SpO2:  [100 %] 100 % (04/17 0631)  Intake/Output from previous day:  Intake/Output Summary (Last 24 hours) at 02/16/2021 0819 Last data filed at 02/16/2021 0600 Gross per 24 hour  Intake 2353.75 ml  Output 1950 ml  Net 403.75 ml    Intake/Output this shift: No intake/output data recorded.  Labs: Recent Labs    02/14/21 0254 02/15/21 0300 02/15/21 1455 02/16/21 0554  HGB 7.4* 6.0* 8.4* 8.5*   Recent Labs    02/15/21 0300 02/15/21 1455 02/16/21 0554  WBC 11.5*  --  12.0*  RBC 2.11*  --  2.95*  HCT 18.3* 25.4* 25.3*  PLT 136*  --  148*   Recent Labs    02/14/21 0254  NA 133*  K 4.4  CL 101  CO2 27  BUN 19  CREATININE 1.20  GLUCOSE 163*  CALCIUM 8.7*   No results for input(s): LABPT, INR in the last 72 hours.  EXAM General - Patient is Alert, Appropriate and Oriented Extremity - Neurologically intact Neurovascular intact No cellulitis present Compartment soft Dressing/Incision - clean, dry, no drainage Motor Function - intact, moving foot and toes well on exam.   Past Medical History:  Diagnosis Date  . ANEMIA, OTHER UNSPEC 08/02/2009  . Arthritis   . B12 DEFICIENCY 07/07/2007  . Bell's palsy     around 2005  . BPPV (benign paroxysmal positional vertigo)    2003  . Cataract   . EKG abnormalities   . GERD 07/13/2007  . Gout, unspecified 06/17/2010  . HYPERLIPIDEMIA 07/13/2007  . HYPERTENSION 07/13/2007  . RENAL INSUFFICIENCY 06/04/2010    Assessment/Plan: 3 Days Post-Op Procedure(s) (LRB): COMPUTER ASSISTED TOTAL KNEE ARTHROPLASTY (Left) Principal Problem:    Osteoarthritis of left knee Active Problems:   Degenerative arthritis of left knee   Up with therapy  Hemoglobin stable post-transfusion Discharge home after PT   Gaynelle Arabian 02/16/2021, 8:19 AM

## 2021-02-16 NOTE — Progress Notes (Signed)
Physical Therapy Treatment Patient Details Name: Scott [name]. MRN: [id number] DOB: 30-Nov-1945 Today's Date: 02/16/2021    History of Present Illness Pt s/p L TKR and with hx of BPPV, Bells palsy, and back surgery    PT Comments    Pt's son present for stair training.  Pt up to ambulate in hall and negotiated stairs with son assisting on 2nd attempt and written instruction provided.  Pt provided with written therex program (pt and son are unsure of first PT appt date), and instructed/assisted with lower body dressing.  Pt eager for dc home.   Follow Up Recommendations  Follow surgeon's recommendation for DC plan and follow-up therapies     Equipment Recommendations  3in1 (PT)    Recommendations for Other Services       Precautions / Restrictions Precautions Precautions: Knee;Fall Restrictions Weight Bearing Restrictions: No LLE Weight Bearing: Weight bearing as tolerated    Mobility  Bed Mobility               General bed mobility comments: Pt up in chair and requests back to same    Transfers Overall transfer level: Needs assistance Equipment used: Rolling walker (2 wheeled) Transfers: Sit to/from Stand Sit to Stand: Supervision         General transfer comment: min cues for LE management and use of UEs to self assist  Ambulation/Gait Ambulation/Gait assistance: Supervision Gait Distance (Feet): 100 Feet Assistive device: Rolling walker (2 wheeled) Gait Pattern/deviations: Step-to pattern;Decreased step length - right;Decreased step length - left;Shuffle;Trunk flexed;Antalgic Gait velocity: decr   General Gait Details: min cues for posture and position from RW;   Stairs Stairs: Yes Stairs assistance: Min assist Stair Management: No rails;Backwards;With walker;Step to pattern Number of Stairs: 4 General stair comments: 2 stairs x 2 bkwd with RW and cues for sequence and foot/RW placement.  Pt's son assisting on 2nd attempt   Wheelchair  Mobility    Modified Rankin (Stroke Patients Only)       Balance Overall balance assessment: Needs assistance Sitting-balance support: Feet supported;No upper extremity supported Sitting balance-Leahy Scale: Good     Standing balance support: No upper extremity supported Standing balance-Leahy Scale: Fair                              Cognition Arousal/Alertness: Awake/alert Behavior During Therapy: WFL for tasks assessed/performed;Impulsive Overall Cognitive Status: Within Functional Limits for tasks assessed                                        Exercises      General Comments        Pertinent Vitals/Pain Pain Assessment: 0-10 Pain Score: 3  Pain Location: L knee Pain Descriptors / Indicators: Aching;Sore Pain Intervention(s): Limited activity within patient's tolerance;Monitored during session;Premedicated before session    Home Living                      Prior Function            PT Goals (current goals can now be found in the care plan section) Acute Rehab PT Goals Patient Stated Goal: Walk normally again PT Goal Formulation: With patient Time For Goal Achievement: 02/21/21 Potential to Achieve Goals: Good Progress towards PT goals: Progressing toward goals    Frequency    7X/week  PT Plan Current plan remains appropriate    Co-evaluation              AM-PAC PT "6 Clicks" Mobility   Outcome Measure  Help needed turning from your back to your side while in a flat bed without using bedrails?: A Little Help needed moving from lying on your back to sitting on the side of a flat bed without using bedrails?: A Little Help needed moving to and from a bed to a chair (including a wheelchair)?: A Little Help needed standing up from a chair using your arms (e.g., wheelchair or bedside chair)?: A Little Help needed to walk in hospital room?: A Little Help needed climbing 3-5 steps with a railing? : A  Little 6 Click Score: 18    End of Session Equipment Utilized During Treatment: Gait belt Activity Tolerance: Patient tolerated treatment well Patient left: in chair;with call bell/phone within reach;with family/visitor present;with nursing/sitter in room Nurse Communication: Mobility status PT Visit Diagnosis: Difficulty in walking, not elsewhere classified (R26.2)     Time: 1457-1530 PT Time Calculation (min) (ACUTE ONLY): 33 min  Charges:  $Gait Training: 8-22 mins $Therapeutic Activity: 8-22 mins                     [place] Pager [phone] Office [phone]    Baelyn Doring 02/16/2021, 4:48 PM

## 2021-02-16 NOTE — Plan of Care (Signed)
  Problem: Pain Management: Goal: Pain level will decrease with appropriate interventions Outcome: Progressing   Problem: Coping: Goal: Level of anxiety will decrease Outcome: Progressing   

## 2021-02-16 NOTE — Progress Notes (Signed)
Physical Therapy Treatment Patient Details Name: Scott DILEONARDO Sr. MRN: [id number] DOB: 1946-02-22 Today's Date: 02/16/2021    History of Present Illness Pt s/p L TKR and with hx of BPPV, Bells palsy, and back surgery    PT Comments    Pt continues very motivated and eager for dc home later today.  This am pt performed therex program with assist and up to ambulate in room to chair for bfast.  Will return for increased ambulation and to attempt stairs.   Follow Up Recommendations  Follow surgeon's recommendation for DC plan and follow-up therapies     Equipment Recommendations  3in1 (PT)    Recommendations for Other Services       Precautions / Restrictions Precautions Precautions: Knee;Fall Restrictions Weight Bearing Restrictions: No LLE Weight Bearing: Weight bearing as tolerated    Mobility  Bed Mobility Overal bed mobility: Needs Assistance Bed Mobility: Supine to Sit     Supine to sit: Supervision     General bed mobility comments: cues for sequence and use of belt to assist L LE    Transfers Overall transfer level: Needs assistance Equipment used: Rolling walker (2 wheeled) Transfers: Sit to/from Stand Sit to Stand: Supervision         General transfer comment: cues for LE management and use of UEs to self assist  Ambulation/Gait Ambulation/Gait assistance: Min guard;Supervision Gait Distance (Feet): 18 Feet Assistive device: Rolling walker (2 wheeled) Gait Pattern/deviations: Step-to pattern;Decreased step length - right;Decreased step length - left;Shuffle;Trunk flexed;Antalgic Gait velocity: decr   General Gait Details: min cues for sequence, posture and position from RW;   Stairs             Wheelchair Mobility    Modified Rankin (Stroke Patients Only)       Balance Overall balance assessment: Needs assistance Sitting-balance support: Feet supported;No upper extremity supported Sitting balance-Leahy Scale: Good      Standing balance support: No upper extremity supported Standing balance-Leahy Scale: Fair                              Cognition Arousal/Alertness: Awake/alert Behavior During Therapy: WFL for tasks assessed/performed;Impulsive Overall Cognitive Status: Within Functional Limits for tasks assessed                                        Exercises Total Joint Exercises Ankle Circles/Pumps: AROM;Both;20 reps;Supine Quad Sets: AROM;Both;Supine;15 reps Heel Slides: AAROM;Left;Supine;20 reps Straight Leg Raises: AAROM;Left;Supine;20 reps Long Arc Quad: AAROM;Left;Seated;15 reps Goniometric ROM: AAROM R knee -8 - 95    General Comments        Pertinent Vitals/Pain Pain Assessment: 0-10 Pain Score: 3  Pain Location: L knee Pain Descriptors / Indicators: Aching;Sore Pain Intervention(s): Limited activity within patient's tolerance;Monitored during session;Premedicated before session;Ice applied    Home Living                      Prior Function            PT Goals (current goals can now be found in the care plan section) Acute Rehab PT Goals Patient Stated Goal: Walk normally again PT Goal Formulation: With patient Time For Goal Achievement: 02/21/21 Potential to Achieve Goals: Good Progress towards PT goals: Progressing toward goals    Frequency    7X/week  PT Plan Current plan remains appropriate    Co-evaluation              AM-PAC PT "6 Clicks" Mobility   Outcome Measure  Help needed turning from your back to your side while in a flat bed without using bedrails?: A Little Help needed moving from lying on your back to sitting on the side of a flat bed without using bedrails?: A Little Help needed moving to and from a bed to a chair (including a wheelchair)?: A Little Help needed standing up from a chair using your arms (e.g., wheelchair or bedside chair)?: A Little Help needed to walk in hospital room?: A  Little Help needed climbing 3-5 steps with a railing? : A Lot 6 Click Score: 17    End of Session Equipment Utilized During Treatment: Gait belt Activity Tolerance: Patient tolerated treatment well Patient left: in chair;with call bell/phone within reach;with chair alarm set Nurse Communication: Mobility status PT Visit Diagnosis: Difficulty in walking, not elsewhere classified (R26.2)     Time: 2025-4270 PT Time Calculation (min) (ACUTE ONLY): 29 min  Charges:  $Gait Training: 8-22 mins $Therapeutic Exercise: 8-22 mins                     [place] Pager [phone] Office [phone]    Scott [name] 02/16/2021, 9:05 AM

## 2021-02-16 NOTE — Progress Notes (Signed)
Physical Therapy Treatment Patient Details Name: Scott VOSSLER Sr. MRN: [id number] DOB: 1946/08/01 Today's Date: 02/16/2021    History of Present Illness Pt s/p L TKR and with hx of BPPV, Bells palsy, and back surgery    PT Comments    Pt continues to progress well with mobility and this session navigated stairs.  Will return for family ed.   Follow Up Recommendations  Follow surgeon's recommendation for DC plan and follow-up therapies     Equipment Recommendations  3in1 (PT)    Recommendations for Other Services       Precautions / Restrictions Precautions Precautions: Knee;Fall Restrictions Weight Bearing Restrictions: No LLE Weight Bearing: Weight bearing as tolerated    Mobility  Bed Mobility Overal bed mobility: Needs Assistance Bed Mobility: Supine to Sit     Supine to sit: Supervision     General bed mobility comments: Pt up in chair and requests back to same    Transfers Overall transfer level: Needs assistance Equipment used: Rolling walker (2 wheeled) Transfers: Sit to/from Stand Sit to Stand: Supervision         General transfer comment: cues for LE management and use of UEs to self assist  Ambulation/Gait Ambulation/Gait assistance: Min guard;Supervision Gait Distance (Feet): 140 Feet Assistive device: Rolling walker (2 wheeled) Gait Pattern/deviations: Step-to pattern;Decreased step length - right;Decreased step length - left;Shuffle;Trunk flexed;Antalgic Gait velocity: decr   General Gait Details: min cues for sequence, posture and position from RW;   Stairs Stairs: Yes Stairs assistance: Min assist Stair Management: No rails;Backwards;With walker;Step to pattern Number of Stairs: 6 General stair comments: 2 stairs x 3 bkwd with RW and cues for sequence and foot/RW placement   Wheelchair Mobility    Modified Rankin (Stroke Patients Only)       Balance Overall balance assessment: Needs assistance Sitting-balance support:  Feet supported;No upper extremity supported Sitting balance-Leahy Scale: Good     Standing balance support: No upper extremity supported Standing balance-Leahy Scale: Fair                              Cognition Arousal/Alertness: Awake/alert Behavior During Therapy: WFL for tasks assessed/performed;Impulsive Overall Cognitive Status: Within Functional Limits for tasks assessed                                        Exercises Total Joint Exercises Ankle Circles/Pumps: AROM;Both;20 reps;Supine Quad Sets: AROM;Both;Supine;15 reps Heel Slides: AAROM;Left;Supine;20 reps Straight Leg Raises: AAROM;Left;Supine;20 reps Long Arc Quad: AAROM;Left;Seated;15 reps Goniometric ROM: AAROM R knee -8 - 95    General Comments        Pertinent Vitals/Pain Pain Assessment: 0-10 Pain Score: 3  Pain Location: L knee Pain Descriptors / Indicators: Aching;Sore Pain Intervention(s): Limited activity within patient's tolerance;Monitored during session;Premedicated before session;Ice applied    Home Living                      Prior Function            PT Goals (current goals can now be found in the care plan section) Acute Rehab PT Goals Patient Stated Goal: Walk normally again PT Goal Formulation: With patient Time For Goal Achievement: 02/21/21 Potential to Achieve Goals: Good Progress towards PT goals: Progressing toward goals    Frequency    7X/week  PT Plan Current plan remains appropriate    Co-evaluation              AM-PAC PT "6 Clicks" Mobility   Outcome Measure  Help needed turning from your back to your side while in a flat bed without using bedrails?: A Little Help needed moving from lying on your back to sitting on the side of a flat bed without using bedrails?: A Little Help needed moving to and from a bed to a chair (including a wheelchair)?: A Little Help needed standing up from a chair using your arms (e.g.,  wheelchair or bedside chair)?: A Little Help needed to walk in hospital room?: A Little Help needed climbing 3-5 steps with a railing? : A Lot 6 Click Score: 17    End of Session Equipment Utilized During Treatment: Gait belt Activity Tolerance: Patient tolerated treatment well Patient left: in chair;with call bell/phone within reach;with chair alarm set Nurse Communication: Mobility status PT Visit Diagnosis: Difficulty in walking, not elsewhere classified (R26.2)     Time: 1023-1050 PT Time Calculation (min) (ACUTE ONLY): 27 min  Charges:  $Gait Training: 8-22 mins $Therapeutic Exercise: 8-22 mins $Therapeutic Activity: 8-22 mins                     [place] Pager [phone] Office [phone]    Littie Chiem 02/16/2021, 12:45 PM

## 2021-02-16 NOTE — Progress Notes (Signed)
Patient discharged to home w/ family. Given all belongings, instructions, equipment. Verbalized understanding of instructions. Escorted to pov via w/c. 

## 2021-02-18 ENCOUNTER — Encounter (HOSPITAL_COMMUNITY): Payer: Self-pay | Admitting: Orthopedic Surgery

## 2021-02-18 DIAGNOSIS — M25562 Pain in left knee: Secondary | ICD-10-CM | POA: Diagnosis not present

## 2021-02-19 NOTE — Discharge Summary (Signed)
Physician Discharge Summary  Patient ID: Scott TAGLIAFERRO Sr. MRN: [id number] DOB/AGE: Oct 20, 1946 75 y.o.  Admit date: 02/13/2021 Discharge date: 02/16/2021  Admission Diagnoses:  Osteoarthritis of left knee  Discharge Diagnoses:  Principal Problem:   Osteoarthritis of left knee Active Problems:   Degenerative arthritis of left knee   Past Medical History:  Diagnosis Date  . ANEMIA, OTHER UNSPEC 08/02/2009  . Arthritis   . B12 DEFICIENCY 07/07/2007  . Bell's palsy     around 2005  . BPPV (benign paroxysmal positional vertigo)    2003  . Cataract   . EKG abnormalities   . GERD 07/13/2007  . Gout, unspecified 06/17/2010  . HYPERLIPIDEMIA 07/13/2007  . HYPERTENSION 07/13/2007  . RENAL INSUFFICIENCY 06/04/2010    Surgeries: Procedure(s): COMPUTER ASSISTED TOTAL KNEE ARTHROPLASTY on 02/13/2021   Consultants (if any):   Discharged Condition: Improved  Hospital Course: Scott Fullam. is an 75 y.o. male who was admitted 02/13/2021 with a diagnosis of Osteoarthritis of left knee and went to the operating room on 02/13/2021 and underwent the above named procedures.    He was given perioperative antibiotics:  Anti-infectives (From admission, onward)   Start     Dose/Rate Route Frequency Ordered Stop   02/13/21 2100  ceFAZolin (ANCEF) IVPB 2g/100 mL premix        2 g 200 mL/hr over 30 Minutes Intravenous Every 6 hours 02/13/21 2007 02/14/21 0350   02/13/21 1115  ceFAZolin (ANCEF) IVPB 2g/100 mL premix        2 g 200 mL/hr over 30 Minutes Intravenous On call to O.R. 02/13/21 1100 02/13/21 1507    .  He was given sequential compression devices, early ambulation, and aspirin for DVT prophylaxis.  He benefited maximally from the hospital stay and there were no complications.    Recent vital signs:  Vitals:   02/16/21 0631 02/16/21 1409  BP: 132/61 (!) 159/69  Pulse: 71 65  Resp: 18 16  Temp: 99 F (37.2 C) 97.9 F (36.6 C)  SpO2: 100% 95%    Recent laboratory studies:   Lab Results  Component Value Date   HGB 8.5 (L) 02/16/2021   HGB 8.4 (L) 02/15/2021   HGB 6.0 (LL) 02/15/2021   Lab Results  Component Value Date   WBC 12.0 (H) 02/16/2021   PLT 148 (L) 02/16/2021   Lab Results  Component Value Date   INR 1.0 02/06/2021   Lab Results  Component Value Date   NA 133 (L) 02/14/2021   K 4.4 02/14/2021   CL 101 02/14/2021   CO2 27 02/14/2021   BUN 19 02/14/2021   CREATININE 1.20 02/14/2021   GLUCOSE 163 (H) 02/14/2021     WEIGHT BEARING   Weight bearing as tolerated with assist device (walker, cane, etc) as directed, use it as long as suggested by your surgeon or therapist, typically at least 4-6 weeks.   EXERCISES  Results after joint replacement surgery are often greatly improved when you follow the exercise, range of motion and muscle strengthening exercises prescribed by your doctor. Safety measures are also important to protect the joint from further injury. Any time any of these exercises cause you to have increased pain or swelling, decrease what you are doing until you are comfortable again and then slowly increase them. If you have problems or questions, call your caregiver or physical therapist for advice.   Rehabilitation is important following a joint replacement. After just a few days of immobilization, the muscles of  the leg can become weakened and shrink (atrophy).  These exercises are designed to build up the tone and strength of the thigh and leg muscles and to improve motion. Often times heat used for twenty to thirty minutes before working out will loosen up your tissues and help with improving the range of motion but do not use heat for the first two weeks following surgery (sometimes heat can increase post-operative swelling).   These exercises can be done on a training (exercise) mat, on the floor, on a table or on a bed. Use whatever works the best and is most comfortable for you.    Use music or television while you are  exercising so that the exercises are a pleasant break in your day. This will make your life better with the exercises acting as a break in your routine that you can look forward to.   Perform all exercises about fifteen times, three times per day or as directed.  You should exercise both the operative leg and the other leg as well.  Exercises include:   . Quad Sets - Tighten up the muscle on the front of the thigh (Quad) and hold for 5-10 seconds.   . Straight Leg Raises - With your knee straight (if you were given a brace, keep it on), lift the leg to 60 degrees, hold for 3 seconds, and slowly lower the leg.  Perform this exercise against resistance later as your leg gets stronger.  . Leg Slides: Lying on your back, slowly slide your foot toward your buttocks, bending your knee up off the floor (only go as far as is comfortable). Then slowly slide your foot back down until your leg is flat on the floor again.  Glenard Haring Wings: Lying on your back spread your legs to the side as far apart as you can without causing discomfort.  . Hamstring Strength:  Lying on your back, push your heel against the floor with your leg straight by tightening up the muscles of your buttocks.  Repeat, but this time bend your knee to a comfortable angle, and push your heel against the floor.  You may put a pillow under the heel to make it more comfortable if necessary.   A rehabilitation program following joint replacement surgery can speed recovery and prevent re-injury in the future due to weakened muscles. Contact your doctor or a physical therapist for more information on knee rehabilitation.    CONSTIPATION  Constipation is defined medically as fewer than three stools per week and severe constipation as less than one stool per week.  Even if you have a regular bowel pattern at home, your normal regimen is likely to be disrupted due to multiple reasons following surgery.  Combination of anesthesia, postoperative narcotics,  change in appetite and fluid intake all can affect your bowels.   YOU MUST use at least one of the following options; they are listed in order of increasing strength to get the job done.  They are all available over the counter, and you may need to use some, POSSIBLY even all of these options:    Drink plenty of fluids (prune juice may be helpful) and high fiber foods Colace 100 mg by mouth twice a day  Senokot for constipation as directed and as needed Dulcolax (bisacodyl), take with full glass of water  Miralax (polyethylene glycol) once or twice a day as needed.  If you have tried all these things and are unable to have a bowel movement  in the first 3-4 days after surgery call either your surgeon or your primary doctor.    If you experience loose stools or diarrhea, hold the medications until you stool forms back up.  If your symptoms do not get better within 1 week or if they get worse, check with your doctor.  If you experience "the worst abdominal pain ever" or develop nausea or vomiting, please contact the office immediately for further recommendations for treatment.   ITCHING:  If you experience itching with your medications, try taking only a single pain pill, or even half a pain pill at a time.  You can also use Benadryl over the counter for itching or also to help with sleep.   TED HOSE STOCKINGS:  Use stockings on both legs until for at least 2 weeks or as directed by physician office. They may be removed at night for sleeping.  MEDICATIONS:  See your medication summary on the "After Visit Summary" that nursing will review with you.  You may have some home medications which will be placed on hold until you complete the course of blood thinner medication.  It is important for you to complete the blood thinner medication as prescribed.  PRECAUTIONS:  If you experience chest pain or shortness of breath - call 911 immediately for transfer to the hospital emergency department.   If you  develop a fever greater that 101 F, purulent drainage from wound, increased redness or drainage from wound, foul odor from the wound/dressing, or calf pain - CONTACT YOUR SURGEON.                                                   FOLLOW-UP APPOINTMENTS:  If you do not already have a post-op appointment, please call the office for an appointment to be seen by your surgeon.  Guidelines for how soon to be seen are listed in your "After Visit Summary", but are typically between 1-4 weeks after surgery.  OTHER INSTRUCTIONS:   Knee Replacement:  Do not place pillow under knee, focus on keeping the knee straight while resting. CPM instructions: 0-90 degrees, 2 hours in the morning, 2 hours in the afternoon, and 2 hours in the evening. Place foam block, curve side up under heel at all times except when in CPM or when walking.  DO NOT modify, tear, cut, or change the foam block in any way.  POST-OPERATIVE OPIOID TAPER INSTRUCTIONS: . It is important to wean off of your opioid medication as soon as possible. If you do not need pain medication after your surgery it is ok to stop day one. Marland Kitchen Opioids include: o Codeine, Hydrocodone(Norco, Vicodin), Oxycodone(Percocet, oxycontin) and hydromorphone amongst others.  . Long term and even short term use of opiods can cause: o Increased pain response o Dependence o Constipation o Depression o Respiratory depression o And more.  . Withdrawal symptoms can include o Flu like symptoms o Nausea, vomiting o And more . Techniques to manage these symptoms o Hydrate well o Eat regular healthy meals o Stay active o Use relaxation techniques(deep breathing, meditating, yoga) . Do Not substitute Alcohol to help with tapering . If you have been on opioids for less than two weeks and do not have pain than it is ok to stop all together.  . Plan to wean off of opioids o This plan  should start within one week post op of your joint replacement. o Maintain the same  interval or time between taking each dose and first decrease the dose.  o Cut the total daily intake of opioids by one tablet each day o Next start to increase the time between doses. o The last dose that should be eliminated is the evening dose.      Dental Antibiotics:  In most cases prophylactic antibiotics for Dental procdeures after total joint surgery are not necessary.  Exceptions are as follows:  1. History of prior total joint infection  2. Severely immunocompromised (Organ Transplant, cancer chemotherapy, Rheumatoid biologic meds such as [place])  3. Poorly controlled diabetes (A1C &gt; 8.0, blood glucose over 200)  If you have one of these conditions, contact your surgeon for an antibiotic prescription, prior to your dental procedure.  MAKE SURE YOU:  . Understand these instructions.  . Get help right away if you are not doing well or get worse.    Thank you for letting us be a part of your medical care team.  It is a privilege we respect greatly.  We hope these instructions will help you stay on track for a fast and full recovery!   Diagnostic Studies: CT Head Wo Contrast  Result Date: 01/24/2021 CLINICAL DATA:  Mental status change EXAM: CT HEAD WITHOUT CONTRAST TECHNIQUE: Contiguous axial images were obtained from the base of the skull through the vertex without intravenous contrast. Sagittal and coronal MPR images reconstructed from axial data set. COMPARISON:  None. FINDINGS: Brain: Generalized atrophy. Normal ventricular morphology. No midline shift or mass effect. Small vessel chronic ischemic changes of deep cerebral white matter. No intracranial hemorrhage, mass lesion, evidence of acute infarction, or extra-axial fluid collection. Vascular: Atherosclerotic calcifications of internal carotid arteries at the skull base. No hyperdense vessels Skull: Calvaria intact. Small subgaleal lipoma in the frontal region. Sinuses/Orbits: Clear Other: N/A IMPRESSION: No acute  intracranial abnormalities. Mild atrophy and small vessel chronic ischemic changes of deep cerebral white matter. Electronically Signed   By: Lavonia Dana M.D.   On: 01/24/2021 16:27   DG Chest Port 1 View  Result Date: 01/24/2021 CLINICAL DATA:  Weakness. EXAM: PORTABLE CHEST 1 VIEW COMPARISON:  03/31/2006 FINDINGS: The cardiomediastinal contours are normal. The lungs are clear. Pulmonary vasculature is normal. No consolidation, pleural effusion, or pneumothorax. No acute osseous abnormalities are seen. Bilateral chronic shoulder arthropathy. IMPRESSION: No acute chest findings. Electronically Signed   By: Keith Rake M.D.   On: 01/24/2021 15:53   DG Knee Left Port  Result Date: 02/13/2021 CLINICAL DATA:  Status post left knee replacement. EXAM: PORTABLE LEFT KNEE - 1-2 VIEW COMPARISON:  None. FINDINGS: A left knee replacement is noted without evidence of surrounding lucency to suggest the presence of hardware loosening or infection. No evidence of acute fracture or dislocation. A large joint effusion is seen with multiple small foci of associated joint and soft tissue air noted. Lateral surgical drains are noted. Multiple anterior radiopaque skin staples are seen. IMPRESSION: 1. Status post left knee replacement without evidence of hardware complication. 2. Large joint effusion, as described above Electronically Signed   By: Virgina Norfolk M.D.   On: 02/13/2021 19:08   ECHOCARDIOGRAM COMPLETE  Result Date: 01/28/2021    ECHOCARDIOGRAM REPORT   Patient Name:   Scott RAUCH Sr. Date of Exam: 01/28/2021 Medical Rec #:  [id number]           Height:  70.0 in Accession #:    [id number]          Weight:       178.0 lb Date of Birth:  1946-06-05           BSA:          1.986 m Patient Age:    57 years            BP:           160/88 mmHg Patient Gender: M                   HR:           79 bpm. Exam Location:  [place] Procedure: 2D Echo, Cardiac Doppler, Color Doppler and Strain Analysis  Indications:    R94.31 Abnormal ECG  History:        Patient has no prior history of Echocardiogram examinations.                 Risk Factors:Hypertension and HLD, CKD.  Sonographer:    Marygrace Drought RCS Referring Phys: [place]  1. Left ventricular ejection fraction, by estimation, is 55%. The left ventricle has normal function. The left ventricle demonstrates regional wall motion abnormalities - abnormal septal motion due to conduction delay. Left ventricular diastolic parameters are consistent with Grade I diastolic dysfunction (impaired relaxation).  2. Right ventricular systolic function is normal. The right ventricular size is normal. There is normal pulmonary artery systolic pressure. The estimated right ventricular systolic pressure is 29.5 mmHg.  3. The mitral valve is normal in structure. Trivial mitral valve regurgitation. No evidence of mitral stenosis.  4. The aortic valve is tricuspid. There is mild calcification of the aortic valve. Aortic valve regurgitation is not visualized. No aortic stenosis is present.  5. The inferior vena cava is normal in size with greater than 50% respiratory variability, suggesting right atrial pressure of 3 mmHg. FINDINGS  Left Ventricle: Left ventricular ejection fraction, by estimation, is 55%. The left ventricle has normal function. The left ventricle demonstrates regional wall motion abnormalities. Global longitudinal strain performed but not reported based on interpreter judgement due to suboptimal tracking. The left ventricular internal cavity size was normal in size. There is no left ventricular hypertrophy. Abnormal (paradoxical) septal motion, consistent with left bundle branch block. Left ventricular diastolic parameters are consistent with Grade I diastolic dysfunction (impaired relaxation). Right Ventricle: The right ventricular size is normal. No increase in right ventricular wall thickness. Right ventricular systolic function is normal.  There is normal pulmonary artery systolic pressure. The tricuspid regurgitant velocity is 2.19 m/s, and  with an assumed right atrial pressure of 3 mmHg, the estimated right ventricular systolic pressure is 62.1 mmHg. Left Atrium: Left atrial size was normal in size. Right Atrium: Right atrial size was normal in size. Pericardium: There is no evidence of pericardial effusion. Mitral Valve: The mitral valve is normal in structure. Trivial mitral valve regurgitation. No evidence of mitral valve stenosis. Tricuspid Valve: The tricuspid valve is normal in structure. Tricuspid valve regurgitation is trivial. No evidence of tricuspid stenosis. Aortic Valve: The aortic valve is tricuspid. There is mild calcification of the aortic valve. Aortic valve regurgitation is not visualized. No aortic stenosis is present. Pulmonic Valve: The pulmonic valve was normal in structure. Pulmonic valve regurgitation is trivial. No evidence of pulmonic stenosis. Aorta: The aortic root is normal in size and structure. Venous: The inferior vena cava is normal in size  with greater than 50% respiratory variability, suggesting right atrial pressure of 3 mmHg. IAS/Shunts: There is redundancy of the interatrial septum. No atrial level shunt detected by color flow Doppler.  LEFT VENTRICLE PLAX 2D LVIDd:         4.30 cm  Diastology LVIDs:         3.10 cm  LV e' medial:    5.66 cm/s LV PW:         0.90 cm  LV E/e' medial:  12.7 LV IVS:        0.80 cm  LV e' lateral:   8.81 cm/s LVOT diam:     2.00 cm  LV E/e' lateral: 8.2 LV SV:         47 LV SV Index:   24 LVOT Area:     3.14 cm  RIGHT VENTRICLE RV Basal diam:  2.50 cm RV S prime:     12.00 cm/s RVSP:           22.2 mmHg LEFT ATRIUM             Index       RIGHT ATRIUM           Index LA diam:        2.90 cm 1.46 cm/m  RA Pressure: 3.00 mmHg LA Vol (A2C):   33.5 ml 16.85 ml/m RA Area:     6.69 cm LA Vol (A4C):   37.2 ml 18.71 ml/m RA Volume:   10.50 ml  5.29 ml/m LA Biplane Vol: 40.0 ml 20.14  ml/m  AORTIC VALVE LVOT Vmax:   86.20 cm/s LVOT Vmean:  62.000 cm/s LVOT VTI:    0.150 m  AORTA Ao Root diam: 3.30 cm Ao Asc diam:  3.20 cm MITRAL VALVE                TRICUSPID VALVE MV Area (PHT):              TR Peak grad:   19.2 mmHg MV Decel Time:              TR Vmax:        219.00 cm/s MV E velocity: 72.00 cm/s   Estimated RAP:  3.00 mmHg MV A velocity: 101.00 cm/s  RVSP:           22.2 mmHg MV E/A ratio:  0.71                             SHUNTS                             Systemic VTI:  0.15 m                             Systemic Diam: 2.00 cm Cherlynn Kaiser MD Electronically signed by Cherlynn Kaiser MD Signature Date/Time: 01/28/2021/3:08:34 PM    Final     Disposition: Discharge disposition: 01-Home or Self Care          Follow-up Information    Rod Can, MD. Call.   Specialty: Orthopedic Surgery Why: Call tomorrow to make the folow up appointment Contact information: [address] [place] [address] [phone]                Signed: Dorothyann Peng 02/19/2021, 2:41 PM

## 2021-02-21 DIAGNOSIS — M25562 Pain in left knee: Secondary | ICD-10-CM | POA: Diagnosis not present

## 2021-02-24 DIAGNOSIS — M25562 Pain in left knee: Secondary | ICD-10-CM | POA: Diagnosis not present

## 2021-02-26 DIAGNOSIS — M25562 Pain in left knee: Secondary | ICD-10-CM | POA: Diagnosis not present

## 2021-02-28 DIAGNOSIS — Z96652 Presence of left artificial knee joint: Secondary | ICD-10-CM | POA: Diagnosis not present

## 2021-02-28 DIAGNOSIS — M25562 Pain in left knee: Secondary | ICD-10-CM | POA: Diagnosis not present

## 2021-03-03 DIAGNOSIS — M25562 Pain in left knee: Secondary | ICD-10-CM | POA: Diagnosis not present

## 2021-03-05 DIAGNOSIS — M25562 Pain in left knee: Secondary | ICD-10-CM | POA: Diagnosis not present

## 2021-03-07 DIAGNOSIS — M25562 Pain in left knee: Secondary | ICD-10-CM | POA: Diagnosis not present

## 2021-03-11 DIAGNOSIS — M25562 Pain in left knee: Secondary | ICD-10-CM | POA: Diagnosis not present

## 2021-03-13 DIAGNOSIS — M25562 Pain in left knee: Secondary | ICD-10-CM | POA: Diagnosis not present

## 2021-03-18 DIAGNOSIS — M25562 Pain in left knee: Secondary | ICD-10-CM | POA: Diagnosis not present

## 2021-03-20 DIAGNOSIS — M25562 Pain in left knee: Secondary | ICD-10-CM | POA: Diagnosis not present

## 2021-03-24 DIAGNOSIS — M25562 Pain in left knee: Secondary | ICD-10-CM | POA: Diagnosis not present

## 2021-03-26 DIAGNOSIS — M25562 Pain in left knee: Secondary | ICD-10-CM | POA: Diagnosis not present

## 2021-04-01 DIAGNOSIS — Z96652 Presence of left artificial knee joint: Secondary | ICD-10-CM | POA: Diagnosis not present

## 2021-04-02 DIAGNOSIS — M25562 Pain in left knee: Secondary | ICD-10-CM | POA: Diagnosis not present

## 2021-04-04 DIAGNOSIS — M25562 Pain in left knee: Secondary | ICD-10-CM | POA: Diagnosis not present

## 2021-04-08 DIAGNOSIS — M25562 Pain in left knee: Secondary | ICD-10-CM | POA: Diagnosis not present

## 2021-04-10 ENCOUNTER — Other Ambulatory Visit: Payer: Self-pay | Admitting: Family Medicine

## 2021-04-10 DIAGNOSIS — M25562 Pain in left knee: Secondary | ICD-10-CM | POA: Diagnosis not present

## 2021-04-15 ENCOUNTER — Other Ambulatory Visit: Payer: Self-pay | Admitting: Family Medicine

## 2021-04-15 DIAGNOSIS — M25562 Pain in left knee: Secondary | ICD-10-CM | POA: Diagnosis not present

## 2021-04-17 ENCOUNTER — Other Ambulatory Visit: Payer: Self-pay | Admitting: Family Medicine

## 2021-04-17 DIAGNOSIS — M25562 Pain in left knee: Secondary | ICD-10-CM | POA: Diagnosis not present

## 2021-04-18 ENCOUNTER — Other Ambulatory Visit: Payer: Self-pay | Admitting: Family Medicine

## 2021-04-18 DIAGNOSIS — M545 Low back pain, unspecified: Secondary | ICD-10-CM

## 2021-04-18 MED ORDER — TRAMADOL HCL 50 MG PO TABS: 100.0000 mg | ORAL_TABLET | Freq: Two times a day (BID) | ORAL | 0 refills | Status: AC

## 2021-04-22 DIAGNOSIS — M25562 Pain in left knee: Secondary | ICD-10-CM | POA: Diagnosis not present

## 2021-04-25 DIAGNOSIS — M25562 Pain in left knee: Secondary | ICD-10-CM | POA: Diagnosis not present

## 2021-04-28 DIAGNOSIS — M25562 Pain in left knee: Secondary | ICD-10-CM | POA: Diagnosis not present

## 2021-04-30 DIAGNOSIS — M25562 Pain in left knee: Secondary | ICD-10-CM | POA: Diagnosis not present

## 2021-05-08 DIAGNOSIS — Z961 Presence of intraocular lens: Secondary | ICD-10-CM | POA: Diagnosis not present

## 2021-05-13 DIAGNOSIS — Z471 Aftercare following joint replacement surgery: Secondary | ICD-10-CM | POA: Diagnosis not present

## 2021-05-13 DIAGNOSIS — Z96652 Presence of left artificial knee joint: Secondary | ICD-10-CM | POA: Diagnosis not present

## 2021-05-18 ENCOUNTER — Other Ambulatory Visit: Payer: Self-pay | Admitting: Family Medicine

## 2021-05-18 DIAGNOSIS — G8929 Other chronic pain: Secondary | ICD-10-CM

## 2021-05-19 ENCOUNTER — Ambulatory Visit (INDEPENDENT_AMBULATORY_CARE_PROVIDER_SITE_OTHER): Payer: Medicare Other | Admitting: Family Medicine

## 2021-05-19 ENCOUNTER — Encounter: Payer: Self-pay | Admitting: Family Medicine

## 2021-05-19 ENCOUNTER — Other Ambulatory Visit: Payer: Self-pay

## 2021-05-19 VITALS — BP 112/64 | HR 76 | Temp 98.3°F | Wt 160.2 lb

## 2021-05-19 DIAGNOSIS — Z862 Personal history of diseases of the blood and blood-forming organs and certain disorders involving the immune mechanism: Secondary | ICD-10-CM | POA: Diagnosis not present

## 2021-05-19 DIAGNOSIS — M545 Low back pain, unspecified: Secondary | ICD-10-CM

## 2021-05-19 DIAGNOSIS — M25511 Pain in right shoulder: Secondary | ICD-10-CM

## 2021-05-19 DIAGNOSIS — M25411 Effusion, right shoulder: Secondary | ICD-10-CM

## 2021-05-19 DIAGNOSIS — Z96652 Presence of left artificial knee joint: Secondary | ICD-10-CM | POA: Diagnosis not present

## 2021-05-19 DIAGNOSIS — G8929 Other chronic pain: Secondary | ICD-10-CM

## 2021-05-19 DIAGNOSIS — N1831 Chronic kidney disease, stage 3a: Secondary | ICD-10-CM | POA: Diagnosis not present

## 2021-05-19 LAB — CBC WITH DIFFERENTIAL/PLATELET
Basophils Absolute: 0 10*3/uL (ref 0.0–0.1)
Basophils Relative: 0.5 % (ref 0.0–3.0)
Eosinophils Absolute: 0.1 10*3/uL (ref 0.0–0.7)
Eosinophils Relative: 1.2 % (ref 0.0–5.0)
HCT: 30.9 % — ABNORMAL LOW (ref 39.0–52.0)
Hemoglobin: 10.4 g/dL — ABNORMAL LOW (ref 13.0–17.0)
Lymphocytes Relative: 14.1 % (ref 12.0–46.0)
Lymphs Abs: 1 10*3/uL (ref 0.7–4.0)
MCHC: 33.6 g/dL (ref 30.0–36.0)
MCV: 83.1 fl (ref 78.0–100.0)
Monocytes Absolute: 0.6 10*3/uL (ref 0.1–1.0)
Monocytes Relative: 8.7 % (ref 3.0–12.0)
Neutro Abs: 5.2 10*3/uL (ref 1.4–7.7)
Neutrophils Relative %: 75.5 % (ref 43.0–77.0)
Platelets: 257 10*3/uL (ref 150.0–400.0)
RBC: 3.72 Mil/uL — ABNORMAL LOW (ref 4.22–5.81)
RDW: 15.7 % — ABNORMAL HIGH (ref 11.5–15.5)
WBC: 6.9 10*3/uL (ref 4.0–10.5)

## 2021-05-19 LAB — BASIC METABOLIC PANEL
BUN: 14 mg/dL (ref 6–23)
CO2: 29 mEq/L (ref 19–32)
Calcium: 9.2 mg/dL (ref 8.4–10.5)
Chloride: 94 mEq/L — ABNORMAL LOW (ref 96–112)
Creatinine, Ser: 1.21 mg/dL (ref 0.40–1.50)
GFR: 57.8 mL/min — ABNORMAL LOW (ref >60.00)
Glucose, Bld: 90 mg/dL (ref 70–99)
Potassium: 4 mEq/L (ref 3.5–5.1)
Sodium: 130 mEq/L — ABNORMAL LOW (ref 135–145)

## 2021-05-19 MED ORDER — TRAMADOL HCL 50 MG PO TABS: 100.0000 mg | ORAL_TABLET | Freq: Two times a day (BID) | ORAL | 1 refills | Status: DC | PRN

## 2021-05-19 NOTE — Progress Notes (Signed)
Subjective:    Patient ID: Scott Apo., male    DOB: 12/09/1945, 75 y.o.   MRN: [id number]  Chief Complaint  Patient presents with   Shoulder Pain    Rt shoulder was  swollen, has went down. Been ising it.    HPI Patient was seen today for acute concern.  Pt endorses worsened R shoulder pain with certain movements and edema since mowing the lawn over the wknd.  Pt uses a riding lawnmower with levers to control steering.  Used ice on shoulder.  Endorses limited ROM.  Using Tramadol.  Finished PT for recent L TKR.  Denies L knee pain, has mild edema.  Pt has not had recent f/u for anemia of CKD.  Required 2 u pRBCs s/p KR.  Denies dizziness, palpitations, fatigue.  Mentions difficulty falling asleep.  Past Medical History:  Diagnosis Date   ANEMIA, OTHER UNSPEC 08/02/2009   Arthritis    B12 DEFICIENCY 07/07/2007   Bell's palsy     around 2005   BPPV (benign paroxysmal positional vertigo)    2003   Cataract    EKG abnormalities    GERD 07/13/2007   Gout, unspecified 06/17/2010   HYPERLIPIDEMIA 07/13/2007   HYPERTENSION 07/13/2007   RENAL INSUFFICIENCY 06/04/2010    Allergies  Allergen Reactions   Ferumoxytol Other (See Comments)    Difficulty breathing. Infusion reaction 01/24/21.   Nsaids Other (See Comments)     hx of renal insufficiency   Statins     myalgias    ROS General: Denies fever, chills, night sweats, changes in weight, changes in appetite HEENT: Denies headaches, ear pain, changes in vision, rhinorrhea, sore throat CV: Denies CP, palpitations, SOB, orthopnea Pulm: Denies SOB, cough, wheezing GI: Denies abdominal pain, nausea, vomiting, diarrhea, constipation GU: Denies dysuria, hematuria, frequency Msk: Denies muscle cramps, joint pains + right shoulder pain and edema.  Mild L knee pain s/p TKR, chronic low back pain Neuro: Denies weakness, numbness, tingling Skin: Denies rashes, bruising Psych: Denies depression, anxiety, hallucinations   Objective:     Blood pressure 112/64, pulse 76, temperature 98.3 F (36.8 C), temperature source Oral, weight 160 lb 3.2 oz (72.7 kg), SpO2 99 %.  Gen. Pleasant, well-nourished, in no distress, normal affect   HEENT: /AT, face symmetric, conjunctiva clear, no scleral icterus, PERRLA, EOMI, nares patent without drainage Lungs: no accessory muscle use Cardiovascular: RRR, no peripheral edema. Musculoskeletal: Bilateral shoulders asymmetric right >L.  Effusion of right shoulder noted.  Limited ROM 90 degrees b/l shoulders.  Decreased strength in RUE.  no cyanosis or clubbing, normal tone Neuro:  A&Ox3, CN II-XII intact, normal gait Skin:  Warm, no lesions/ ras.  Well-healed surgical incision midline left knee  Wt Readings from Last 3 Encounters:  05/19/21 160 lb 3.2 oz (72.7 kg)  02/13/21 160 lb 15 oz (73 kg)  01/24/21 178 lb (80.7 kg)    Lab Results  Component Value Date   WBC 12.0 (H) 02/16/2021   HGB 8.5 (L) 02/16/2021   HCT 25.3 (L) 02/16/2021   PLT 148 (L) 02/16/2021   GLUCOSE 163 (H) 02/14/2021   CHOL 191 07/19/2020   TRIG 70 07/19/2020   HDL 48 07/19/2020   LDLDIRECT 131.0 01/24/2016   LDLCALC 127 (H) 07/19/2020   ALT 21 02/06/2021   AST 29 02/06/2021   NA 133 (L) 02/14/2021   K 4.4 02/14/2021   CL 101 02/14/2021   CREATININE 1.20 02/14/2021   BUN 19 02/14/2021   CO2 27  02/14/2021   TSH 1.93 10/10/2014   PSA 3.52 03/30/2017   INR 1.0 02/06/2021   HGBA1C 6.3 12/02/2020    Assessment/Plan:  Chronic right shoulder pain  -Discussed possible causes including glenohumeral joint arthritis, tendinitis, rotator cuff tear - Plan: Ambulatory referral to Orthopedic Surgery, traMADol (ULTRAM) 50 MG tablet  Effusion of joint of right shoulder - Plan: CBC with Differential/Platelet, Ambulatory referral to Orthopedic Surgery  History of anemia -Hemoglobin 8.5 and hematocrit 25.3 on 02/16/2021 - Plan: CBC with Differential/Platelet  Stage 3a chronic kidney disease (HCC) -Baseline GFR  50s -Continue to monitor -Avoid nephrotoxic meds and renally dose meds - Plan: Basic metabolic panel  Status post total left knee replacement -Continue exercises at home -Continue follow-up with Ortho, Dr. Lyla Glassing  Chronic midline low back pain without sciatica  - Plan: traMADol (ULTRAM) 50 MG tablet  F/u as needed  Grier Mitts, MD

## 2021-05-23 ENCOUNTER — Other Ambulatory Visit: Payer: Self-pay

## 2021-05-23 DIAGNOSIS — E871 Hypo-osmolality and hyponatremia: Secondary | ICD-10-CM

## 2021-05-28 ENCOUNTER — Other Ambulatory Visit: Payer: Self-pay

## 2021-05-28 ENCOUNTER — Other Ambulatory Visit (INDEPENDENT_AMBULATORY_CARE_PROVIDER_SITE_OTHER): Payer: Medicare Other

## 2021-05-28 ENCOUNTER — Other Ambulatory Visit: Payer: Medicare Other

## 2021-05-28 DIAGNOSIS — E871 Hypo-osmolality and hyponatremia: Secondary | ICD-10-CM | POA: Diagnosis not present

## 2021-05-28 DIAGNOSIS — M25511 Pain in right shoulder: Secondary | ICD-10-CM | POA: Diagnosis not present

## 2021-05-28 LAB — BASIC METABOLIC PANEL
BUN: 16 mg/dL (ref 6–23)
CO2: 26 mEq/L (ref 19–32)
Calcium: 9.5 mg/dL (ref 8.4–10.5)
Chloride: 93 mEq/L — ABNORMAL LOW (ref 96–112)
Creatinine, Ser: 1.3 mg/dL (ref 0.40–1.50)
GFR: 53.02 mL/min — ABNORMAL LOW (ref >60.00)
Glucose, Bld: 73 mg/dL (ref 70–99)
Potassium: 3.7 mEq/L (ref 3.5–5.1)
Sodium: 128 mEq/L — ABNORMAL LOW (ref 135–145)

## 2021-05-30 ENCOUNTER — Other Ambulatory Visit: Payer: Self-pay

## 2021-05-30 ENCOUNTER — Other Ambulatory Visit (INDEPENDENT_AMBULATORY_CARE_PROVIDER_SITE_OTHER): Payer: Medicare Other

## 2021-05-30 DIAGNOSIS — E871 Hypo-osmolality and hyponatremia: Secondary | ICD-10-CM

## 2021-05-30 LAB — BASIC METABOLIC PANEL
BUN/Creatinine Ratio: 11 (calc) (ref 6–22)
BUN: 21 mg/dL (ref 7–25)
CO2: 28 mmol/L (ref 20–32)
Calcium: 9.3 mg/dL (ref 8.6–10.3)
Chloride: 95 mmol/L — ABNORMAL LOW (ref 98–110)
Creat: 1.83 mg/dL — ABNORMAL HIGH (ref 0.70–1.28)
Glucose, Bld: 90 mg/dL (ref 65–99)
Potassium: 4.3 mmol/L (ref 3.5–5.3)
Sodium: 132 mmol/L — ABNORMAL LOW (ref 135–146)

## 2021-05-30 NOTE — Addendum Note (Signed)
Addended by: Amanda Cockayne on: 05/30/2021 04:39 PM   Modules accepted: Orders

## 2021-05-30 NOTE — Addendum Note (Signed)
Addended by: Amanda Cockayne on: 05/30/2021 04:22 PM   Modules accepted: Orders

## 2021-06-09 ENCOUNTER — Telehealth: Payer: Self-pay | Admitting: Family Medicine

## 2021-06-09 NOTE — Telephone Encounter (Signed)
Left message for patient to call back and schedule Medicare Annual Wellness Visit (AWV) either virtually or in office.  Left both  my jabber number [phone] and office number    Last AWV 06/24/20  please schedule at anytime with [facility] Nurse Health Advisor 1 or 2  Patient can have calendar year uhc  This should be a 45 minute visit.

## 2021-06-18 ENCOUNTER — Other Ambulatory Visit: Payer: Self-pay | Admitting: Family Medicine

## 2021-06-23 ENCOUNTER — Encounter: Payer: Self-pay | Admitting: Family Medicine

## 2021-06-23 ENCOUNTER — Other Ambulatory Visit: Payer: Self-pay

## 2021-06-23 ENCOUNTER — Ambulatory Visit (INDEPENDENT_AMBULATORY_CARE_PROVIDER_SITE_OTHER): Payer: Medicare Other

## 2021-06-23 ENCOUNTER — Ambulatory Visit (INDEPENDENT_AMBULATORY_CARE_PROVIDER_SITE_OTHER): Payer: Medicare Other | Admitting: Family Medicine

## 2021-06-23 VITALS — BP 122/72 | HR 67 | Temp 98.6°F | Wt 156.0 lb

## 2021-06-23 VITALS — BP 122/72 | HR 67 | Temp 98.6°F | Ht 70.0 in | Wt 156.0 lb

## 2021-06-23 DIAGNOSIS — E871 Hypo-osmolality and hyponatremia: Secondary | ICD-10-CM | POA: Diagnosis not present

## 2021-06-23 DIAGNOSIS — M545 Low back pain, unspecified: Secondary | ICD-10-CM

## 2021-06-23 DIAGNOSIS — M19011 Primary osteoarthritis, right shoulder: Secondary | ICD-10-CM | POA: Diagnosis not present

## 2021-06-23 DIAGNOSIS — G8929 Other chronic pain: Secondary | ICD-10-CM | POA: Diagnosis not present

## 2021-06-23 DIAGNOSIS — Z0000 Encounter for general adult medical examination without abnormal findings: Secondary | ICD-10-CM

## 2021-06-23 DIAGNOSIS — N1831 Chronic kidney disease, stage 3a: Secondary | ICD-10-CM | POA: Diagnosis not present

## 2021-06-23 NOTE — Progress Notes (Signed)
Subjective:    Patient ID: Scott Apo., male    DOB: 1946/07/13, 75 y.o.   MRN: [id number]  Chief Complaint  Patient presents with   Medication Problem    Discuss tramadol alternatives    HPI Patient was seen today for follow-up.  Patient with history of hyponatremia.  Currently on tramadol for low back pain and other chronic joint pain.  Followed by Ortho.  Status post left TKR.  Also with history of CKD stage IIIa.  Advised tramadol may be contributing to hyponatremia.  Tried to go a few days without tramadol but states the pain would go away.  Taking 2 tramadol in a.m. and 1 at night with relief.  Recently restarted Celebrex.  Patient constantly doing work around the house such as cleaning grout, mowing grass, taking care of rental property.  Also taking care of his grandsons.  Patient also endorses right shoulder pain.  Advised OA.  Considering contacting Ortho for joint injection.  Past Medical History:  Diagnosis Date   ANEMIA, OTHER UNSPEC 08/02/2009   Arthritis    B12 DEFICIENCY 07/07/2007   Bell's palsy     around 2005   BPPV (benign paroxysmal positional vertigo)    2003   Cataract    EKG abnormalities    GERD 07/13/2007   Gout, unspecified 06/17/2010   HYPERLIPIDEMIA 07/13/2007   HYPERTENSION 07/13/2007   RENAL INSUFFICIENCY 06/04/2010    Allergies  Allergen Reactions   Ferumoxytol Other (See Comments)    Difficulty breathing. Infusion reaction 01/24/21.   Nsaids Other (See Comments)     hx of renal insufficiency   Statins     myalgias    ROS General: Denies fever, chills, night sweats, changes in weight, changes in appetite HEENT: Denies headaches, ear pain, changes in vision, rhinorrhea, sore throat CV: Denies CP, palpitations, SOB, orthopnea Pulm: Denies SOB, cough, wheezing GI: Denies abdominal pain, nausea, vomiting, diarrhea, constipation GU: Denies dysuria, hematuria, frequency Msk: Denies muscle cramps, joint pains Neuro: Denies weakness, numbness,  tingling Skin: Denies rashes, bruising Psych: Denies depression, anxiety, hallucinations    Objective:    Blood pressure 122/72, pulse 67, temperature 98.6 F (37 C), temperature source Oral, weight 156 lb (70.8 kg), SpO2 97 %.  Gen. Pleasant, well-nourished, in no distress, normal affect   HEENT: [place]/AT, face symmetric, conjunctiva clear, no scleral icterus, PERRLA, EOMI, nares patent without drainage Lungs: no accessory muscle use Cardiovascular: RRR, no peripheral edema Musculoskeletal: Right shoulder edema.  No deformities, no cyanosis or clubbing, normal tone Neuro:  A&Ox3, CN II-XII intact, normal gait Skin:  Warm, no lesions/ rash  Wt Readings from Last 3 Encounters:  06/23/21 156 lb (70.8 kg)  05/19/21 160 lb 3.2 oz (72.7 kg)  02/13/21 160 lb 15 oz (73 kg)    Lab Results  Component Value Date   WBC 6.9 05/19/2021   HGB 10.4 (L) 05/19/2021   HCT 30.9 (L) 05/19/2021   PLT 257.0 05/19/2021   GLUCOSE 90 05/30/2021   CHOL 191 07/19/2020   TRIG 70 07/19/2020   HDL 48 07/19/2020   LDLDIRECT 131.0 01/24/2016   LDLCALC 127 (H) 07/19/2020   ALT 21 02/06/2021   AST 29 02/06/2021   NA 132 (L) 05/30/2021   K 4.3 05/30/2021   CL 95 (L) 05/30/2021   CREATININE 1.83 (H) 05/30/2021   BUN 21 05/30/2021   CO2 28 05/30/2021   TSH 1.93 10/10/2014   PSA 3.52 03/30/2017   INR 1.0 02/06/2021   HGBA1C  6.3 12/02/2020    Assessment/Plan:  Stage 3a chronic kidney disease ([place]) -Discussed avoiding nephrotoxic medications and renally dosing medications -Avoid NSAID use -P.o. hydration -Nephrology follow-up  Hyponatremia -Stable -Fluid restriction -Advised need to find an alternative medication to tramadol as it could be contributing to hyponatremia. -Follow-up with nephrology  Chronic bilateral low back pain without sciatica -Advised 2/2 arthritis and gout -Currently taking tramadol. -Discussed limiting/avoiding use of NSAIDs -Advised to stop Celebrex. -Discussed PT,  water aerobics and other treatment options  Primary osteoarthritis of right shoulder -Continue topical analgesics -Continue follow-up with Ortho for injection -Patient advised to decrease the amount of labor-intensive work he does around the house  F/u as needed in the next month  Grier Mitts, MD

## 2021-06-23 NOTE — Patient Instructions (Signed)
Mr. Scott [name] , Thank you for taking time to come for your Medicare Wellness Visit. I appreciate your ongoing commitment to your health goals. Please review the following plan we discussed and let me know if I can assist you in the future.   Screening recommendations/referrals: Colonoscopy: no longer required  Recommended yearly ophthalmology/optometry visit for glaucoma screening and checkup Recommended yearly dental visit for hygiene and checkup  Vaccinations: Influenza vaccine: due in fall 2022  Pneumococcal vaccine: completed series  Tdap vaccine: due with injury  Shingles vaccine: will consider     Advanced directives: none   Conditions/risks identified: none   Next appointment: 06/23/2021 1130am Dr.Banks   Preventive Care 53 Years and Older, Male Preventive care refers to lifestyle choices and visits with your health care provider that can promote health and wellness. What does preventive care include? A yearly physical exam. This is also called an annual well check. Dental exams once or twice a year. Routine eye exams. Ask your health care provider how often you should have your eyes checked. Personal lifestyle choices, including: Daily care of your teeth and gums. Regular physical activity. Eating a healthy diet. Avoiding tobacco and drug use. Limiting alcohol use. Practicing safe sex. Taking low doses of aspirin every day. Taking vitamin and mineral supplements as recommended by your health care provider. What happens during an annual well check? The services and screenings done by your health care provider during your annual well check will depend on your age, overall health, lifestyle risk factors, and family history of disease. Counseling  Your health care provider may ask you questions about your: Alcohol use. Tobacco use. Drug use. Emotional well-being. Home and relationship well-being. Sexual activity. Eating habits. History of falls. Memory and ability to  understand (cognition). Work and work Statistician. Screening  You may have the following tests or measurements: Height, weight, and BMI. Blood pressure. Lipid and cholesterol levels. These may be checked every 5 years, or more frequently if you are over 74 years old. Skin check. Lung cancer screening. You may have this screening every year starting at age 78 if you have a 30-pack-year history of smoking and currently smoke or have quit within the past 15 years. Fecal occult blood test (FOBT) of the stool. You may have this test every year starting at age 42. Flexible sigmoidoscopy or colonoscopy. You may have a sigmoidoscopy every 5 years or a colonoscopy every 10 years starting at age 65. Prostate cancer screening. Recommendations will vary depending on your family history and other risks. Hepatitis C blood test. Hepatitis B blood test. Sexually transmitted disease (STD) testing. Diabetes screening. This is done by checking your blood sugar (glucose) after you have not eaten for a while (fasting). You may have this done every 1-3 years. Abdominal aortic aneurysm (AAA) screening. You may need this if you are a current or former smoker. Osteoporosis. You may be screened starting at age 61 if you are at high risk. Talk with your health care provider about your test results, treatment options, and if necessary, the need for more tests. Vaccines  Your health care provider may recommend certain vaccines, such as: Influenza vaccine. This is recommended every year. Tetanus, diphtheria, and acellular pertussis (Tdap, Td) vaccine. You may need a Td booster every 10 years. Zoster vaccine. You may need this after age 60. Pneumococcal 13-valent conjugate (PCV13) vaccine. One dose is recommended after age 10. Pneumococcal polysaccharide (PPSV23) vaccine. One dose is recommended after age 52. Talk to your health care  provider about which screenings and vaccines you need and how often you need them. This  information is not intended to replace advice given to you by your health care provider. Make sure you discuss any questions you have with your health care provider. Document Released: 11/15/2015 Document Revised: 07/08/2016 Document Reviewed: 08/20/2015 Elsevier Interactive Patient Education  2017 [place] Prevention in the Home Falls can cause injuries. They can happen to people of all ages. There are many things you can do to make your home safe and to help prevent falls. What can I do on the outside of my home? Regularly fix the edges of walkways and driveways and fix any cracks. Remove anything that might make you trip as you walk through a door, such as a raised step or threshold. Trim any bushes or trees on the path to your home. Use bright outdoor lighting. Clear any walking paths of anything that might make someone trip, such as rocks or tools. Regularly check to see if handrails are loose or broken. Make sure that both sides of any steps have handrails. Any raised decks and porches should have guardrails on the edges. Have any leaves, snow, or ice cleared regularly. Use sand or salt on walking paths during winter. Clean up any spills in your garage right away. This includes oil or grease spills. What can I do in the bathroom? Use night lights. Install grab bars by the toilet and in the tub and shower. Do not use towel bars as grab bars. Use non-skid mats or decals in the tub or shower. If you need to sit down in the shower, use a plastic, non-slip stool. Keep the floor dry. Clean up any water that spills on the floor as soon as it happens. Remove soap buildup in the tub or shower regularly. Attach bath mats securely with double-sided non-slip rug tape. Do not have throw rugs and other things on the floor that can make you trip. What can I do in the bedroom? Use night lights. Make sure that you have a light by your bed that is easy to reach. Do not use any sheets or  blankets that are too big for your bed. They should not hang down onto the floor. Have a firm chair that has side arms. You can use this for support while you get dressed. Do not have throw rugs and other things on the floor that can make you trip. What can I do in the kitchen? Clean up any spills right away. Avoid walking on wet floors. Keep items that you use a lot in easy-to-reach places. If you need to reach something above you, use a strong step stool that has a grab bar. Keep electrical cords out of the way. Do not use floor polish or wax that makes floors slippery. If you must use wax, use non-skid floor wax. Do not have throw rugs and other things on the floor that can make you trip. What can I do with my stairs? Do not leave any items on the stairs. Make sure that there are handrails on both sides of the stairs and use them. Fix handrails that are broken or loose. Make sure that handrails are as long as the stairways. Check any carpeting to make sure that it is firmly attached to the stairs. Fix any carpet that is loose or worn. Avoid having throw rugs at the top or bottom of the stairs. If you do have throw rugs, attach them to the  floor with carpet tape. Make sure that you have a light switch at the top of the stairs and the bottom of the stairs. If you do not have them, ask someone to add them for you. What else can I do to help prevent falls? Wear shoes that: Do not have high heels. Have rubber bottoms. Are comfortable and fit you well. Are closed at the toe. Do not wear sandals. If you use a stepladder: Make sure that it is fully opened. Do not climb a closed stepladder. Make sure that both sides of the stepladder are locked into place. Ask someone to hold it for you, if possible. Clearly mark and make sure that you can see: Any grab bars or handrails. First and last steps. Where the edge of each step is. Use tools that help you move around (mobility aids) if they are  needed. These include: Canes. Walkers. Scooters. Crutches. Turn on the lights when you go into a dark area. Replace any light bulbs as soon as they burn out. Set up your furniture so you have a clear path. Avoid moving your furniture around. If any of your floors are uneven, fix them. If there are any pets around you, be aware of where they are. Review your medicines with your doctor. Some medicines can make you feel dizzy. This can increase your chance of falling. Ask your doctor what other things that you can do to help prevent falls. This information is not intended to replace advice given to you by your health care provider. Make sure you discuss any questions you have with your health care provider. Document Released: 08/15/2009 Document Revised: 03/26/2016 Document Reviewed: 11/23/2014 Elsevier Interactive Patient Education  2017 [employer].

## 2021-06-23 NOTE — Progress Notes (Signed)
Subjective:   Scott DUBA Sr. is a 75 y.o. male who presents for an Subsequent  Medicare Annual Wellness Visit.  Review of Systems    N/a       Objective:    There were no vitals filed for this visit. There is no height or weight on file to calculate BMI.  Advanced Directives 02/13/2021 02/06/2021 01/24/2021 12/09/2020 06/24/2020  Does Patient Have a Medical Advance Directive? No No No No No  Would patient like information on creating a medical advance directive? No - Patient declined - No - Patient declined - No - Patient declined    Current Medications (verified) Outpatient Encounter Medications as of 06/23/2021  Medication Sig   acetaminophen (TYLENOL) 325 MG tablet Take 1-2 tablets (325-650 mg total) by mouth every 6 (six) hours as needed for mild pain (pain score 1-3 or temp > 100.5).   allopurinol (ZYLOPRIM) 300 MG tablet TAKE 1 TABLET BY MOUTH EVERY DAY   celecoxib (CELEBREX) 100 MG capsule TAKE 1 CAPSULE (100 MG TOTAL) BY MOUTH 2 (TWO) TIMES DAILY AS NEEDED FOR MODERATE PAIN (SHOULDER). (Patient taking differently: Take 100 mg by mouth in the morning.)   colchicine 0.6 MG tablet TAKE 1 TABLET BY MOUTH EVERY DAY (Patient taking differently: Take 0.6 mg by mouth daily as needed (gout flares).)   Cyanocobalamin 1000 MCG SUBL Place 1 tablet (1,000 mcg total) under the tongue once a week. (Patient taking differently: Place 1,000 mcg under the tongue 2 (two) times a week. Wednesdays & Sundays)   docusate sodium (COLACE) 100 MG capsule Take 1 capsule (100 mg total) by mouth 2 (two) times daily.   Ferrous Sulfate (IRON PO) Take 1 tablet by mouth in the morning and at bedtime.   fluticasone (FLONASE) 50 MCG/ACT nasal spray SPRAY 2 SPRAYS INTO EACH NOSTRIL EVERY DAY (Patient taking differently: Place 2 sprays into both nostrils daily as needed for allergies.)   losartan (COZAAR) 50 MG tablet Take 1 tablet (50 mg total) by mouth daily. (Patient taking differently: Take 50 mg by mouth at  bedtime.)   ondansetron (ZOFRAN) 4 MG tablet Take 1 tablet (4 mg total) by mouth every 6 (six) hours as needed for nausea.   oxyCODONE (OXY IR/ROXICODONE) 5 MG immediate release tablet Take 1 tablet (5 mg total) by mouth every 4 (four) hours as needed for moderate pain (pain score 4-6).   Polyethylene Glycol 3350 POWD Take 17 g by mouth at bedtime.   predniSONE (DELTASONE) 20 MG tablet Take 2 tablets (40 mg total) by mouth daily. (Patient not taking: No sig reported)   senna (SENOKOT) 8.6 MG TABS tablet Take 1 tablet (8.6 mg total) by mouth 2 (two) times daily.   sildenafil (VIAGRA) 50 MG tablet Take one half tab (25 mg) daily as needed 1 hour prior to sexual activity. (Patient not taking: No sig reported)   traMADol (ULTRAM) 50 MG tablet tramadol 50 mg tablet  TAKE 2 TABLETS BY MOUTH 2 TIMES DAILY.   traMADol (ULTRAM) 50 MG tablet Take 2 tablets (100 mg total) by mouth every 12 (twelve) hours as needed.   triamterene-hydrochlorothiazide (MAXZIDE-25) 37.5-25 MG tablet Take 1 tablet by mouth daily. (Patient taking differently: Take 1 tablet by mouth in the morning.)   No facility-administered encounter medications on file as of 06/23/2021.    Allergies (verified) Ferumoxytol, Nsaids, and Statins   History: Past Medical History:  Diagnosis Date   ANEMIA, OTHER UNSPEC 08/02/2009   Arthritis    B12  DEFICIENCY 07/07/2007   Bell's palsy     around 2005   BPPV (benign paroxysmal positional vertigo)    2003   Cataract    EKG abnormalities    GERD 07/13/2007   Gout, unspecified 06/17/2010   HYPERLIPIDEMIA 07/13/2007   HYPERTENSION 07/13/2007   RENAL INSUFFICIENCY 06/04/2010   Past Surgical History:  Procedure Laterality Date   BACK SURGERY  07/13/07   CARPAL TUNNEL RELEASE Bilateral    CATARACT EXTRACTION, BILATERAL Bilateral    KNEE ARTHROPLASTY Left 02/13/2021   Procedure: COMPUTER ASSISTED TOTAL KNEE ARTHROPLASTY;  Surgeon: Rod Can, MD;  Location: [facility];  Service: Orthopedics;   Laterality: Left;   Family History  Problem Relation Age of Onset   Arthritis Father    Social History   Socioeconomic History   Marital status: Married    Spouse name: [place]   Number of children: 4   Years of education: Not on file   Highest education level: Some college, no degree  Occupational History    Employer: RETIRED  Tobacco Use   Smoking status: Never   Smokeless tobacco: Never  Vaping Use   Vaping Use: Never used  Substance and Sexual Activity   Alcohol use: No   Drug use: No   Sexual activity: Not on file  Other Topics Concern   Not on file  Social History Narrative   Married 1965. 4 children. 11 grandchildren. 1 greatgrandchild on way in 2015.       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 He is married, lives with his wife. He states he stops every morning and gets a 52oz tea. He walks most days.   Social Determinants of Health   Financial Resource Strain: Low Risk    Difficulty of Paying Living Expenses: Not hard at all  Food Insecurity: No Food Insecurity   Worried About Charity fundraiser in the Last Year: Never true   [place] in the Last Year: Never true  Transportation Needs: No Transportation Needs   Lack of Transportation (Medical): No   Lack of Transportation (Non-Medical): No  Physical Activity: Inactive   Days of Exercise per Week: 0 days   Minutes of Exercise per Session: 0 min  Stress: No Stress Concern Present   Feeling of Stress : Not at all  Social Connections: Moderately Isolated   Frequency of Communication with Friends and Family: Twice a week   Frequency of Social Gatherings with Friends and Family: Twice a week   Attends Religious Services: Never   Marine scientist or Organizations: No   Attends Archivist Meetings: Never   Marital Status: Married    Tobacco Counseling Counseling given: Not Answered   Clinical Intake:                  Diabetic?no        Activities of Daily Living In your present state of health, do you have any difficulty performing the following activities: 02/13/2021 02/06/2021  Hearing? N N  Vision? Y N  Difficulty concentrating or making decisions? N N  Walking or climbing stairs? N Y  Dressing or bathing? N N  Doing errands, shopping? N N  Preparing Food and eating ? - -  Using the Toilet? - -  In the past six months, have you accidently leaked urine? - -  Do you have problems with loss of bowel control? - -  Managing your Medications? - -  Managing your Finances? - -  Housekeeping or managing your Housekeeping? - -  Some recent data might be hidden    Patient Care Team: [name], MD as PCP - General (Family Medicine)  Indicate any recent Medical Services you may have received from other than [facility] providers in the past year (date may be approximate).     Assessment:   This is a routine wellness examination for Rachael.  Hearing/Vision screen No results found.  Dietary issues and exercise activities discussed:     Goals Addressed   None    Depression Screen PHQ 2/9 Scores 07/11/2020 06/24/2020 03/30/2017 01/24/2016 10/22/2014 06/16/2013  PHQ - 2 Score 0 0 0 0 0 0  PHQ- 9 Score 1 0 - - - -    Fall Risk Fall Risk  07/11/2020 06/24/2020 08/03/2018 04/08/2018 03/30/2017  Falls in the past year? 0 0 No No No  Number falls in past yr: - 0 - - -  Injury with Fall? - 0 - - -  Risk for fall due to : - History of fall(s);Medication side effect - - -  Follow up - Falls evaluation completed;Falls prevention discussed - - -    FALL RISK PREVENTION PERTAINING TO THE HOME:  Any stairs in or around the home? Yes  If so, are there any without handrails? No  Home free of loose throw rugs in walkways, pet beds, electrical cords, etc? Yes  Adequate lighting in your home to reduce risk of falls? Yes   ASSISTIVE DEVICES UTILIZED TO PREVENT FALLS:  Life alert? No  Use of a cane, walker or  w/c? Yes  Grab bars in the bathroom? No  Shower chair or bench in shower? No  Elevated toilet seat or a handicapped toilet? No   TIMED UP AND GO:  Was the test performed? Yes .  Length of time to ambulate 10 feet: 10 sec.   Gait steady and fast with assistive device  Cognitive Function:     6CIT Screen 06/24/2020  What Year? 0 points  What month? 0 points  What time? 0 points  Count back from 20 0 points  Months in reverse 0 points  Repeat phrase 4 points  Total Score 4    Immunizations Immunization History  Administered Date(s) Administered   PFIZER(Purple Top)SARS-COV-2 Vaccination 12/02/2019, 12/30/2019   Pneumococcal Conjugate-13 01/30/2014   Pneumococcal Polysaccharide-23 06/16/2013   Td 01/18/2009    TDAP status: Due, Education has been provided regarding the importance of this vaccine. Advised may receive this vaccine at local pharmacy or Health Dept. Aware to provide a copy of the vaccination record if obtained from local pharmacy or Health Dept. Verbalized acceptance and understanding.  Flu Vaccine status: Up to date  Pneumococcal vaccine status: Up to date  Covid-19 vaccine status: Completed vaccines  Qualifies for Shingles Vaccine? Yes   Zostavax completed No   Shingrix Completed?: No.    Education has been provided regarding the importance of this vaccine. Patient has been advised to call insurance company to determine out of pocket expense if they have not yet received this vaccine. Advised may also receive vaccine at local pharmacy or Health Dept. Verbalized acceptance and understanding.  Screening Tests Health Maintenance  Topic Date Due   Zoster Vaccines- Shingrix (1 of 2) Never done   TETANUS/TDAP  01/19/2019   COVID-19 Vaccine (3 - Pfizer risk series) 01/27/2020   INFLUENZA VACCINE  04/20/2022 (Originally 06/02/2021)   Hepatitis C  Screening  Completed   PNA vac Low Risk Adult  Completed   HPV VACCINES  Aged Out    Health Maintenance  Health  Maintenance Due  Topic Date Due   Zoster Vaccines- Shingrix (1 of 2) Never done   TETANUS/TDAP  01/19/2019   COVID-19 Vaccine (3 - Pfizer risk series) 01/27/2020    Colorectal cancer screening: No longer required.   Lung Cancer Screening: (Low Dose CT Chest recommended if Age 4-80 years, 30 pack-year currently smoking OR have quit w/in 15years.) does not qualify.   Lung Cancer Screening Referral: n/a  Additional Screening:  Hepatitis C Screening: does not qualify; Completed 01/24/2016  Vision Screening: Recommended annual ophthalmology exams for early detection of glaucoma and other disorders of the eye. Is the patient up to date with their annual eye exam?  Yes  Who is the provider or what is the name of the office in which the patient attends annual eye exams? [facility] If pt is not established with a provider, would they like to be referred to a provider to establish care? No .   Dental Screening: Recommended annual dental exams for proper oral hygiene  Community Resource Referral / Chronic Care Management: CRR required this visit?  No   CCM required this visit?  No      Plan:     I have personally reviewed and noted the following in the patient's chart:   Medical and social history Use of alcohol, tobacco or illicit drugs  Current medications and supplements including opioid prescriptions. Patient is currently taking opioid prescriptions. Information provided to patient regarding non-opioid alternatives. Patient advised to discuss non-opioid treatment plan with their provider. Functional ability and status Nutritional status Physical activity Advanced directives List of other physicians Hospitalizations, surgeries, and ER visits in previous 12 months Vitals Screenings to include cognitive, depression, and falls Referrals and appointments  In addition, I have reviewed and discussed with patient certain preventive protocols, quality metrics, and best  practice recommendations. A written personalized care plan for preventive services as well as general preventive health recommendations were provided to patient.     Randel Pigg, LPN   [id number]   Nurse Notes: none

## 2021-06-24 DIAGNOSIS — M25511 Pain in right shoulder: Secondary | ICD-10-CM | POA: Diagnosis not present

## 2021-07-01 ENCOUNTER — Telehealth: Payer: Self-pay | Admitting: Family Medicine

## 2021-07-01 NOTE — Telephone Encounter (Signed)
Jimmey Ralph from [facility] called because quantaflo showed Bilateral severe PAD. Will Fax results   Recommendation is vascular consult    Good callback number for questions is [phone]   Please Advise

## 2021-07-02 NOTE — Telephone Encounter (Signed)
Okay.  Awaiting results.

## 2021-07-18 ENCOUNTER — Ambulatory Visit: Payer: Medicare Other | Admitting: Family Medicine

## 2021-07-21 ENCOUNTER — Other Ambulatory Visit: Payer: Self-pay | Admitting: Family Medicine

## 2021-07-21 DIAGNOSIS — G8929 Other chronic pain: Secondary | ICD-10-CM

## 2021-07-21 DIAGNOSIS — M25511 Pain in right shoulder: Secondary | ICD-10-CM

## 2021-07-22 ENCOUNTER — Other Ambulatory Visit: Payer: Self-pay

## 2021-07-23 ENCOUNTER — Ambulatory Visit (INDEPENDENT_AMBULATORY_CARE_PROVIDER_SITE_OTHER): Payer: Medicare Other | Admitting: Family Medicine

## 2021-07-23 ENCOUNTER — Encounter: Payer: Self-pay | Admitting: Family Medicine

## 2021-07-23 VITALS — BP 128/70 | HR 66 | Temp 98.1°F | Wt 159.2 lb

## 2021-07-23 DIAGNOSIS — M545 Low back pain, unspecified: Secondary | ICD-10-CM | POA: Diagnosis not present

## 2021-07-23 DIAGNOSIS — M25511 Pain in right shoulder: Secondary | ICD-10-CM

## 2021-07-23 DIAGNOSIS — I739 Peripheral vascular disease, unspecified: Secondary | ICD-10-CM | POA: Diagnosis not present

## 2021-07-23 DIAGNOSIS — G8929 Other chronic pain: Secondary | ICD-10-CM

## 2021-07-23 DIAGNOSIS — M25512 Pain in left shoulder: Secondary | ICD-10-CM

## 2021-07-23 NOTE — Progress Notes (Signed)
Subjective:    Patient ID: Scott Apo., male    DOB: Jul 06, 1946, 75 y.o.   MRN: [id number]  Chief Complaint  Patient presents with   circulation    Circulation in leg has slowed down according to house call nurse    HPI Patient was seen today for f/u on chronic conditions and for a new concern.  Pt endorses a house call nurse from insurance advised him his circulation is decreased in his legs.  Pt does not recall other details about his circulation. Pt denies pain in LEs, edema, discoloration.  Pt inquires about tramadol refill for chronic back pain.  Having b/l shoulder pain R>L.  Unable to raise R arm above head without using other hand to help.  Seen by Ortho.  Had and injection.  Advised glenohumeral cartilage is worn away in R shoulder.  Pt endorses edema starting in R shoulder again.   Past Medical History:  Diagnosis Date   ANEMIA, OTHER UNSPEC 08/02/2009   Arthritis    B12 DEFICIENCY 07/07/2007   Bell's palsy     around 2005   BPPV (benign paroxysmal positional vertigo)    2003   Cataract    EKG abnormalities    GERD 07/13/2007   Gout, unspecified 06/17/2010   HYPERLIPIDEMIA 07/13/2007   HYPERTENSION 07/13/2007   RENAL INSUFFICIENCY 06/04/2010    Allergies  Allergen Reactions   Ferumoxytol Other (See Comments)    Difficulty breathing. Infusion reaction 01/24/21.   Nsaids Other (See Comments)     hx of renal insufficiency   Statins     myalgias    ROS General: Denies fever, chills, night sweats, changes in weight, changes in appetite HEENT: Denies headaches, ear pain, changes in vision, rhinorrhea, sore throat CV: Denies CP, palpitations, SOB, orthopnea Pulm: Denies SOB, cough, wheezing GI: Denies abdominal pain, nausea, vomiting, diarrhea, constipation GU: Denies dysuria, hematuria, frequency Msk: Denies muscle cramps +b/l shoulder pain, back pain Neuro: Denies weakness, numbness, tingling Skin: Denies rashes, bruising Psych: Denies depression, anxiety,  hallucinations     Objective:    Blood pressure 128/70, pulse 66, temperature 98.1 F (36.7 C), temperature source Oral, weight 159 lb 3.2 oz (72.2 kg), SpO2 99 %.  Gen. Pleasant, well-nourished, in no distress, normal affect   HEENT: [place]/AT, face symmetric, conjunctiva clear, no scleral icterus, PERRLA, EOMI, nares patent without drainage Lungs: no accessory muscle use Cardiovascular: RRR, no peripheral edema Musculoskeletal: Limited ROM of b/l UEs at shoulder, R>L.  No deformities, no cyanosis or clubbing, normal tone Neuro:  A&Ox3, CN II-XII intact, ambulating with cane Skin:  Warm, no lesions/ rash   Wt Readings from Last 3 Encounters:  06/23/21 156 lb (70.8 kg)  06/23/21 156 lb (70.8 kg)  05/19/21 160 lb 3.2 oz (72.7 kg)    Lab Results  Component Value Date   WBC 6.9 05/19/2021   HGB 10.4 (L) 05/19/2021   HCT 30.9 (L) 05/19/2021   PLT 257.0 05/19/2021   GLUCOSE 90 05/30/2021   CHOL 191 07/19/2020   TRIG 70 07/19/2020   HDL 48 07/19/2020   LDLDIRECT 131.0 01/24/2016   LDLCALC 127 (H) 07/19/2020   ALT 21 02/06/2021   AST 29 02/06/2021   NA 132 (L) 05/30/2021   K 4.3 05/30/2021   CL 95 (L) 05/30/2021   CREATININE 1.83 (H) 05/30/2021   BUN 21 05/30/2021   CO2 28 05/30/2021   TSH 1.93 10/10/2014   PSA 3.52 03/30/2017   INR 1.0 02/06/2021   HGBA1C  6.3 12/02/2020    Assessment/Plan:  PVD (peripheral vascular disease) ([place]) -Asymptomatic -QuantaFlo 0.24 in R foot (severe) and 0.42 in L foot (mild/moderate) -Discussed interventions including compression socks or TED hose, ASA 81 mg, elevation of LEs when sitting -Intolerance to statins--caused myalgias -Discussed vascular surgery referral.  Will wait at this time  Chronic right shoulder pain -No recent imaging per chart review. -Imaging 10+ years ago with narrowing of glenohumeral joint -Seen by Ortho, per patient advised glenohumeral cartilage derangement -Discussed supportive care including heat, massage,  stretching, NSAIDs as needed. -Continue tramadol - Plan: Ambulatory referral to Physical Therapy  Chronic left shoulder pain -Continue supportive care -Continue follow-up with Ortho - Plan: Ambulatory referral to Physical Therapy  Chronic midline low back pain without sciatica  -Continue supportive care -Continue tramadol as needed -PDMP reviewed - Plan: Ambulatory referral to Physical Therapy  F/u as needed  Grier Mitts, MD

## 2021-08-06 ENCOUNTER — Telehealth: Payer: Self-pay | Admitting: Family Medicine

## 2021-08-06 NOTE — Chronic Care Management (AMB) (Signed)
  Chronic Care Management   Outreach Note  08/06/2021 Name: Scott DEJAYNES Sr. MRN: [id number] DOB: November 09, 1945  Referred by: [name], MD Reason for referral : No chief complaint on file.   An unsuccessful telephone outreach was attempted today. The patient was referred to the pharmacist for assistance with care management and care coordination.   Follow Up Plan:   Tatjana Dellinger Upstream Scheduler

## 2021-08-11 ENCOUNTER — Ambulatory Visit: Payer: Medicare Other | Admitting: Family Medicine

## 2021-08-11 DIAGNOSIS — Z96652 Presence of left artificial knee joint: Secondary | ICD-10-CM | POA: Diagnosis not present

## 2021-08-13 ENCOUNTER — Telehealth: Payer: Self-pay | Admitting: Family Medicine

## 2021-08-13 NOTE — Chronic Care Management (AMB) (Signed)
  Chronic Care Management   Note  08/13/2021 Name: Scott BRUNTZ Sr. MRN: [id number] DOB: July 24, 1946  Scott Ransom Sr. is a 75 y.o. year old male who is a primary care patient of [name], MD. I reached out to [place] by phone today in response to a referral sent by [name]'s PCP, [name], MD.   Scott [name] was given information about Chronic Care Management services today including:  CCM service includes personalized support from designated clinical staff supervised by his physician, including individualized plan of care and coordination with other care providers 24/7 contact phone numbers for assistance for urgent and routine care needs. Service will only be billed when office clinical staff spend 20 minutes or more in a month to coordinate care. Only one practitioner may furnish and bill the service in a calendar month. The patient may stop CCM services at any time (effective at the end of the month) by phone call to the office staff.   Patient agreed to services and verbal consent obtained.   Follow up plan:   Tatjana Secretary/administrator

## 2021-08-14 DIAGNOSIS — M47816 Spondylosis without myelopathy or radiculopathy, lumbar region: Secondary | ICD-10-CM | POA: Diagnosis not present

## 2021-08-14 DIAGNOSIS — M5416 Radiculopathy, lumbar region: Secondary | ICD-10-CM | POA: Diagnosis not present

## 2021-08-14 IMAGING — DX DG KNEE 1-2V PORT*L*
2 series · 2 of 2 positions shown · non-contrast
Comparison: None.

CLINICAL DATA: Status post left knee replacement.

EXAM:
PORTABLE LEFT KNEE - 1-2 VIEW

[knee ap]
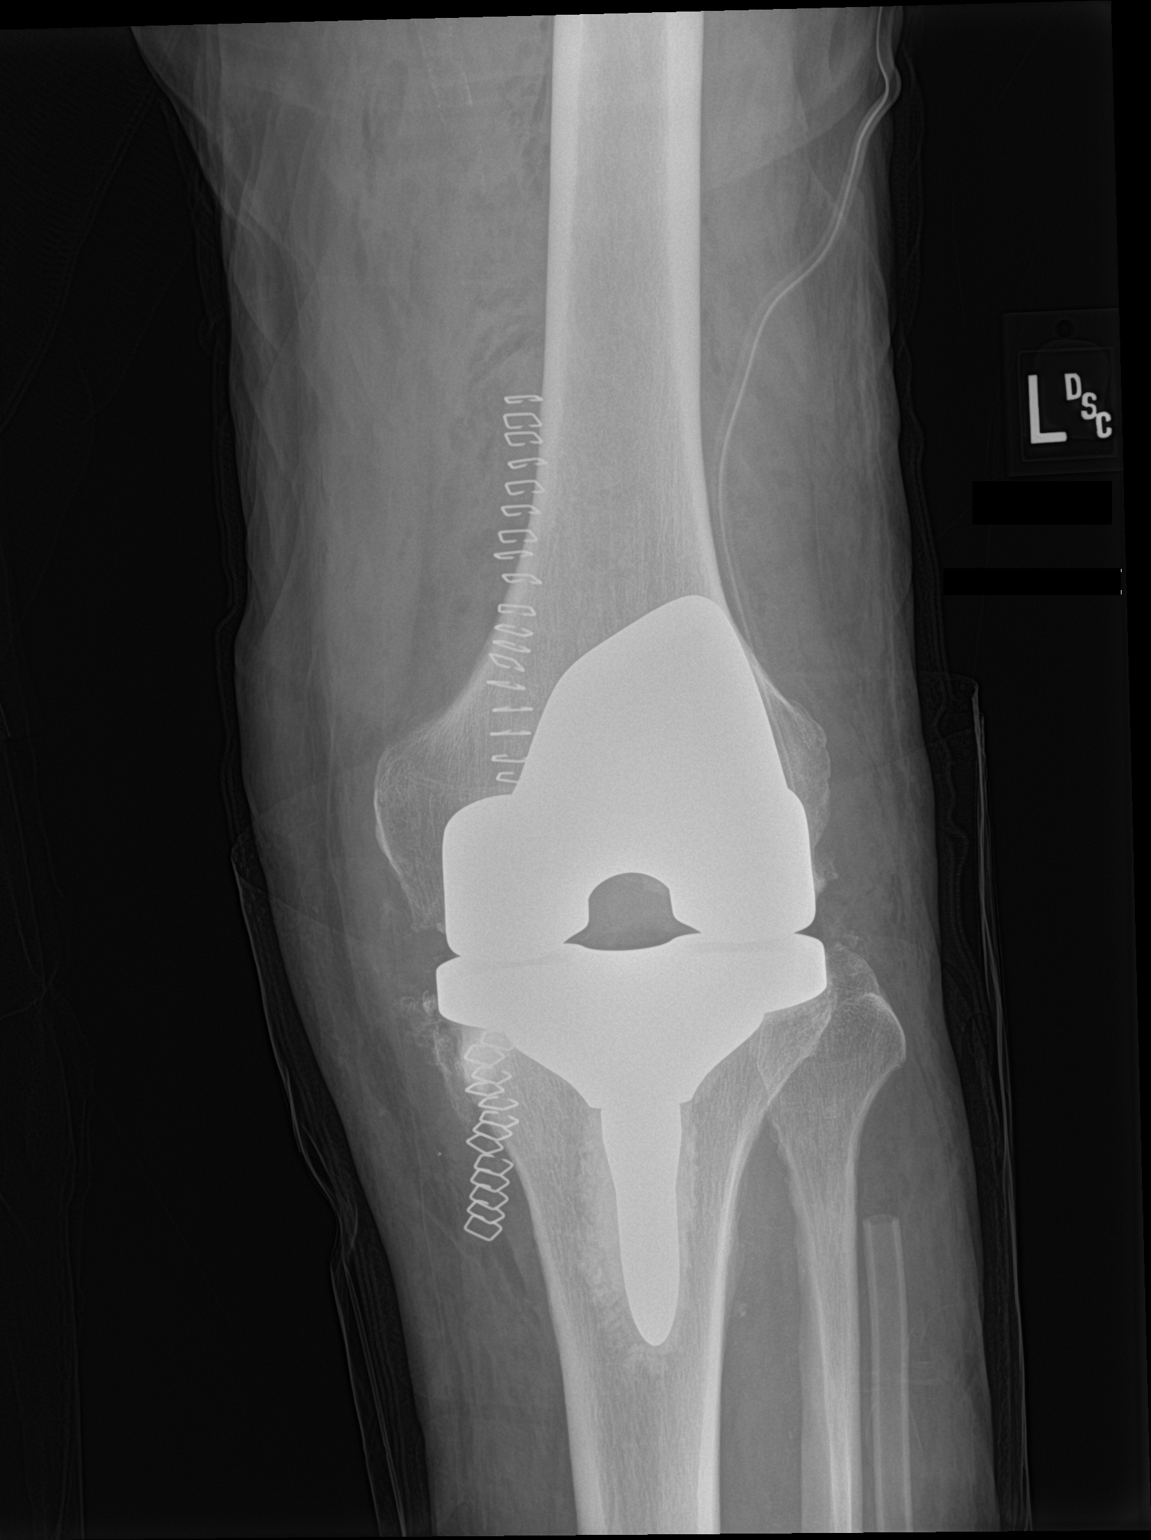

[knee lat]
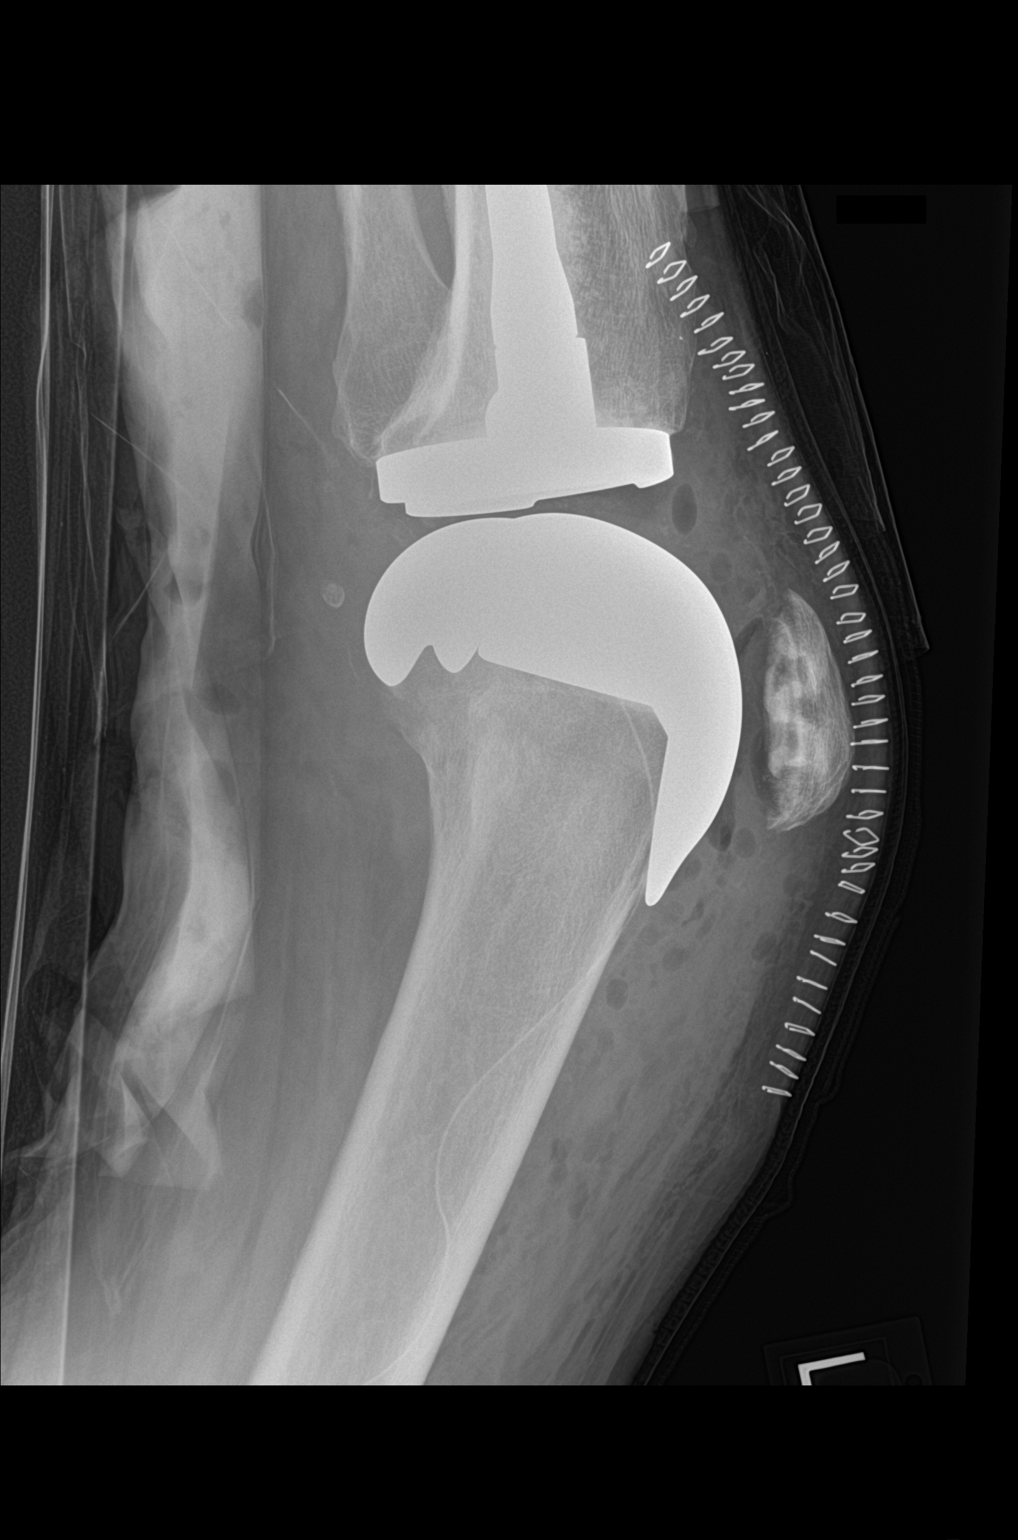

[2 of 2 positions shown; findings below may reference images not displayed]

FINDINGS: A left knee replacement is noted without evidence of surrounding
lucency to suggest the presence of hardware loosening or infection.
No evidence of acute fracture or dislocation. A large joint effusion
is seen with multiple small foci of associated joint and soft tissue
air noted. Lateral surgical drains are noted. Multiple anterior
radiopaque skin staples are seen.
IMPRESSION: 1. Status post left knee replacement without evidence of hardware
complication.
2. Large joint effusion, as described above

## 2021-08-28 ENCOUNTER — Telehealth: Payer: Self-pay | Admitting: Family Medicine

## 2021-08-28 NOTE — Telephone Encounter (Signed)
Patient called to ask if Dr.Banks minds filling out paperwork to renew his handicap sticker. Patient would like a call when everything is ready to be picked up    Good callback number is [phone]      Please advise

## 2021-09-01 ENCOUNTER — Telehealth: Payer: Self-pay | Admitting: Family Medicine

## 2021-09-01 NOTE — Telephone Encounter (Signed)
Patient brought in paperwork that he needs Dr.Banks to complete. Paperwork will be placed in the folder.  Patient could be contacted at [phone] when completed.  Please advise.

## 2021-09-02 NOTE — Telephone Encounter (Signed)
See other phone note

## 2021-09-05 NOTE — Telephone Encounter (Signed)
Paperwork placed on providers desk

## 2021-09-21 ENCOUNTER — Other Ambulatory Visit: Payer: Self-pay | Admitting: Family Medicine

## 2021-09-21 DIAGNOSIS — G8929 Other chronic pain: Secondary | ICD-10-CM

## 2021-09-21 DIAGNOSIS — M25511 Pain in right shoulder: Secondary | ICD-10-CM

## 2021-09-21 DIAGNOSIS — M545 Low back pain, unspecified: Secondary | ICD-10-CM

## 2021-09-23 DIAGNOSIS — M1711 Unilateral primary osteoarthritis, right knee: Secondary | ICD-10-CM | POA: Diagnosis not present

## 2021-10-03 ENCOUNTER — Telehealth: Payer: Self-pay | Admitting: Pharmacist

## 2021-10-03 ENCOUNTER — Other Ambulatory Visit: Payer: Self-pay | Admitting: Family Medicine

## 2021-10-03 DIAGNOSIS — M1712 Unilateral primary osteoarthritis, left knee: Secondary | ICD-10-CM

## 2021-10-03 NOTE — Chronic Care Management (AMB) (Signed)
Chronic Care Management Pharmacy Assistant   Name: Scott MCAULAY Sr.  MRN: [id number] DOB: 01-09-1946  Scott Ransom Sr. is an 75 y.o. year old male who presents for his initial CCM visit with the clinical pharmacist.  Reason for Encounter: Chart prep for initial visit with Jeni Salles the clinical pharmacist on 10/07/2021 at 11:30 in office.  Unable to reach patient for chart prep, unable to leave a message for reminder, no recorder on home phone (just rings) and vm not set up on cell.   Conditions to be addressed/monitored: HTN, HLD, CKD Stage 3, Gout, GERD, and Osteoarthritis  Recent office visits:  07/23/2021 Grier Mitts MD - Patient was seen for PVD and additional issues. No medication changes. Follow up if symptoms worsen or fail to improve.  06/23/2021 Grier Mitts MD - Patient was seen for Stage 3 chronic kidney disease. No medication changes. Follow up if symptoms worsen or fail to improve.  06/23/2021 Randel Pigg LPN - Medicare annual wellness exam  05/19/2021 Grier Mitts MD - Patient was seen for chronic right shoulder pain and additional issues. Increased Tramadol to 50 mg take 2 tablets twice daily. Follow up if symptoms worsen or fail to improve.  Recent consult visits:  None  Hospital visits:  None  Medications: Outpatient Encounter Medications as of 10/03/2021  Medication Sig   acetaminophen (TYLENOL) 325 MG tablet Take 1-2 tablets (325-650 mg total) by mouth every 6 (six) hours as needed for mild pain (pain score 1-3 or temp > 100.5).   allopurinol (ZYLOPRIM) 300 MG tablet TAKE 1 TABLET BY MOUTH EVERY DAY   celecoxib (CELEBREX) 100 MG capsule TAKE 1 CAPSULE (100 MG TOTAL) BY MOUTH 2 (TWO) TIMES DAILY AS NEEDED FOR MODERATE PAIN (SHOULDER). (Patient taking differently: Take 100 mg by mouth in the morning.)   colchicine 0.6 MG tablet TAKE 1 TABLET BY MOUTH EVERY DAY (Patient taking differently: Take 0.6 mg by mouth daily as needed (gout flares).)    Cyanocobalamin 1000 MCG SUBL Place 1 tablet (1,000 mcg total) under the tongue once a week. (Patient taking differently: Place 1,000 mcg under the tongue 2 (two) times a week. Wednesdays & Sundays)   docusate sodium (COLACE) 100 MG capsule Take 1 capsule (100 mg total) by mouth 2 (two) times daily.   Ferrous Sulfate (IRON PO) Take 1 tablet by mouth in the morning and at bedtime.   fluticasone (FLONASE) 50 MCG/ACT nasal spray SPRAY 2 SPRAYS INTO EACH NOSTRIL EVERY DAY (Patient taking differently: Place 2 sprays into both nostrils daily as needed for allergies.)   losartan (COZAAR) 50 MG tablet Take 1 tablet (50 mg total) by mouth daily. (Patient taking differently: Take 50 mg by mouth at bedtime.)   ondansetron (ZOFRAN) 4 MG tablet Take 1 tablet (4 mg total) by mouth every 6 (six) hours as needed for nausea.   Polyethylene Glycol 3350 POWD Take 17 g by mouth at bedtime.   predniSONE (DELTASONE) 20 MG tablet Take 2 tablets (40 mg total) by mouth daily. (Patient not taking: No sig reported)   senna (SENOKOT) 8.6 MG TABS tablet Take 1 tablet (8.6 mg total) by mouth 2 (two) times daily. (Patient not taking: No sig reported)   sildenafil (VIAGRA) 50 MG tablet Take one half tab (25 mg) daily as needed 1 hour prior to sexual activity.   traMADol (ULTRAM) 50 MG tablet TAKE 2 TABLETS (100 MG TOTAL) BY MOUTH EVERY 12 (TWELVE) HOURS AS NEEDED.   triamterene-hydrochlorothiazide (MAXZIDE-25) 37.5-25  MG tablet Take 1 tablet by mouth daily. (Patient taking differently: Take 1 tablet by mouth in the morning.)   No facility-administered encounter medications on file as of 10/03/2021.  Fill History: ALLOPURINOL 300 MG TABLET 08/28/2021 90   CELECOXIB 100 MG CAPSULE 08/15/2021 30   LOSARTAN POTASSIUM 50 MG TAB 08/02/2021 90   TRIAMTERENE-HCTZ 37.5-25 MG TB 07/04/2021 90   TRAMADOL HCL 50 MG TABLET 09/24/2021 30   Have you seen any other providers since your last visit? **  Any changes in your medications or  health?   Any side effects from any medications?   Do you have an symptoms or problems not managed by your medications?   Any concerns about your health right now?   Has your provider asked that you check blood pressure, blood sugar, or follow special diet at home?   Do you get any type of exercise on a regular basis?   Can you think of a goal you would like to reach for your health?   Do you have any problems getting your medications?   Is there anything that you would like to discuss during the appointment?   Please bring medications and supplements to appointment  Care Gaps: AWV - completed 06/23/2021 Last BP - 128/70 on 07/23/2021 Shingrix - never done TDAP - overdue Covid vaccine - overdue  Star Rating Drugs: Losartan 50 mg - last filled 08/02/2021 90 DS at [facility] Pharmacist Assistant [phone]

## 2021-10-07 ENCOUNTER — Ambulatory Visit: Payer: Medicare Other | Admitting: Pharmacist

## 2021-10-07 VITALS — BP 124/68

## 2021-10-07 DIAGNOSIS — Z862 Personal history of diseases of the blood and blood-forming organs and certain disorders involving the immune mechanism: Secondary | ICD-10-CM

## 2021-10-07 DIAGNOSIS — I10 Essential (primary) hypertension: Secondary | ICD-10-CM

## 2021-10-07 NOTE — Patient Instructions (Addendum)
Hi Hakim,  It was great to get to meet you in person! Below is a summary of some of the topics we discussed.   Don't forget to pick up ferrous sulfate (iron) 325 mg from the pharmacy. Also, don't forget to try to keep up with checking your blood pressure at home as we discussed as well.  I also scheduled you for a telephone follow up in 6 months. Please let me know if this won't work!  Please reach out to me if you have any questions or need anything before our follow up!  Best, Maddie  Jeni Salles, PharmD, [facility]   Visit Information   Goals Addressed   None    Patient Care Plan: CCM Pharmacy Care Plan     Problem Identified: Problem: Hypertension, Hyperlipidemia, GERD, Chronic Kidney Disease, Osteoarthritis, and Gout      Long-Range Goal: Patient-Specific Goal   Start Date: 10/07/2021  Expected End Date: 10/07/2022  This Visit's Progress: On track  Priority: High  Note:   Current Barriers:  Unable to independently monitor therapeutic efficacy  Pharmacist Clinical Goal(s):  Patient will achieve adherence to monitoring guidelines and medication adherence to achieve therapeutic efficacy through collaboration with PharmD and provider.   Interventions: 1:1 collaboration with [name], MD regarding development and update of comprehensive plan of care as evidenced by provider attestation and co-signature Inter-disciplinary care team collaboration (see longitudinal plan of care) Comprehensive medication review performed; medication list updated in electronic medical record  Hypertension (BP goal <130/80) -Controlled -Current treatment: Losartan 50 mg 1 tablet daily at bedtime Triamterene-HCTZ 37.5-25 mg 1 tablet daily in morning -Medications previously tried: n/a  -Current home readings: has an arm cuff but does not check often -Current dietary habits: does eat out often (usually breakfast at  [facility]) -Current exercise habits: no structured exercise -Denies hypotensive/hypertensive symptoms -Educated on BP goals and benefits of medications for prevention of heart attack, stroke and kidney damage; Importance of home blood pressure monitoring; Proper BP monitoring technique; Symptoms of hypotension and importance of maintaining adequate hydration; -Counseled to monitor BP at home weekly, document, and provide log at future appointments -Counseled on diet and exercise extensively Recommended to continue current medication  Gout (Goal: uric acid < 6 and prevent flare ups) -Controlled -Current treatment  Allopurinol 300 mg 1 tablet daily Colchicine 0.6 mg as needed -Medications previously tried: none  -Counseled on foods and beverages that can increase the risk of gout such as some seafood, red meat, and sweet beverages like tea. Recommended repeat uric acid level  Osteoarthritis (Goal: minimize pain) -Controlled -Current treatment  Celecoxib 100 mg 1 capsule 2 times daily as needed Tramadol 50 mg 2 tablets every 12 hours as needed Acetaminophen 500 mg doesn't take Aleve as needed -Medications previously tried: n/a  -Counseled on limiting use of NSAIDs due to risk of bleeding.  Health Maintenance -Vaccine gaps: shingrix, tetanus, COVID booster -Current therapy:  Vitamin B12 1000 mcg SL twice weekly Ferrous sulfate 1 tablet twice daily - not taking  Sildenafil 50 mg 1 tablet as needed Miralax as needed Flonase nasal spray as needed -Educated on Cost vs benefit of each product must be carefully weighed by individual consumer -Patient is satisfied with current therapy and denies issues -Recommended restarting iron supplementation.  Patient Goals/Self-Care Activities Patient will:  - take medications as prescribed as evidenced by patient report and record review check blood pressure weekly, document, and provide at future appointments target  a minimum of 150 minutes  of moderate intensity exercise weekly  Follow Up Plan: Telephone follow up appointment with care management team member scheduled for: 6 months      Mr. Joung was given information about Chronic Care Management services today including:  CCM service includes personalized support from designated clinical staff supervised by his physician, including individualized plan of care and coordination with other care providers 24/7 contact phone numbers for assistance for urgent and routine care needs. Standard insurance, coinsurance, copays and deductibles apply for chronic care management only during months in which we provide at least 20 minutes of these services. Most insurances cover these services at 100%, however patients may be responsible for any copay, coinsurance and/or deductible if applicable. This service may help you avoid the need for more expensive face-to-face services. Only one practitioner may furnish and bill the service in a calendar month. The patient may stop CCM services at any time (effective at the end of the month) by phone call to the office staff.  Patient agreed to services and verbal consent obtained.   The patient verbalized understanding of instructions, educational materials, and care plan provided today and agreed to receive a mailed copy of patient instructions, educational materials, and care plan.  Telephone follow up appointment with pharmacy team member scheduled for: 6 months  [name], [facility]

## 2021-10-07 NOTE — Progress Notes (Signed)
Chronic Care Management Pharmacy Note  10/22/2021 Name:  SHASHANK KWASNIK Sr. MRN:  [id number] DOB:  02-26-1946  Summary: BP is at goal < 140/90 per office readings but does not check at home  Recommendations/Changes made from today's visit: -Recommended routine BP monitoring at home -Recommended restarting iron supplementation -Recommend repeat uric acid level  Plan: BP assessment in 2-3 months   Subjective: [name] is an 75 y.o. year old male who is a primary patient of [name], MD.  The CCM team was consulted for assistance with disease management and care coordination needs.    Engaged with patient face to face for initial visit in response to provider referral for pharmacy case management and/or care coordination services.   Consent to Services:  The patient was given the following information about Chronic Care Management services today, agreed to services, and gave verbal consent: 1. CCM service includes personalized support from designated clinical staff supervised by the primary care provider, including individualized plan of care and coordination with other care providers 2. 24/7 contact phone numbers for assistance for urgent and routine care needs. 3. Service will only be billed when office clinical staff spend 20 minutes or more in a month to coordinate care. 4. Only one practitioner may furnish and bill the service in a calendar month. 5.The patient may stop CCM services at any time (effective at the end of the month) by phone call to the office staff. 6. The patient will be responsible for cost sharing (co-pay) of up to 20% of the service fee (after annual deductible is met). Patient agreed to services and consent obtained.  Patient Care Team: [name], MD as PCP - General (Family Medicine) [name], [facility] as Pharmacist (Pharmacist)  Recent office visits: 07/23/2021 Grier Mitts MD - Patient was seen for PVD and additional issues.  Referred to PT. No medication changes. Follow up if symptoms worsen or fail to improve.   06/23/2021 Grier Mitts MD - Patient was seen for Stage 3 chronic kidney disease. No medication changes. Follow up if symptoms worsen or fail to improve.   06/23/2021 Randel Pigg LPN - Medicare annual wellness exam   05/19/2021 Grier Mitts MD - Patient was seen for chronic right shoulder pain and additional issues. Referred to ortho surgery. Increased Tramadol to 50 mg take 2 tablets twice daily. Follow up if symptoms worsen or fail to improve.  Recent consult visits: 09/23/21 Rod Can (ortho): Patient presented for osteoarthritis of right knee. Unable to access notes.  08/14/21 Levy Pupa, PA (emergeortho): Patient presented for lumbar radiculopathy. Unable to access notes.    Hospital visits: None in previous 6 months   Objective:  Lab Results  Component Value Date   CREATININE 1.83 (H) 05/30/2021   BUN 21 05/30/2021   GFR 53.02 (L) 05/28/2021   GFRNONAA >60 02/14/2021   GFRAA 46 (L) 07/19/2020   NA 132 (L) 05/30/2021   K 4.3 05/30/2021   CALCIUM 9.3 05/30/2021   CO2 28 05/30/2021   GLUCOSE 90 05/30/2021    Lab Results  Component Value Date/Time   HGBA1C 6.3 12/02/2020 04:28 PM   HGBA1C 6.2 06/15/2018 02:44 PM   GFR 53.02 (L) 05/28/2021 01:58 PM   GFR 57.80 (L) 05/19/2021 11:53 AM    Last diabetic Eye exam: No results found for: HMDIABEYEEXA  Last diabetic Foot exam: No results found for: HMDIABFOOTEX   Lab Results  Component Value Date   CHOL 191 07/19/2020  HDL 48 07/19/2020   LDLCALC 127 (H) 07/19/2020   LDLDIRECT 131.0 01/24/2016   TRIG 70 07/19/2020   CHOLHDL 4.0 07/19/2020    Hepatic Function Latest Ref Rng & Units 02/06/2021 01/24/2021 01/13/2021  Total Protein 6.5 - 8.1 g/dL 7.4 7.2 6.7  Albumin 3.5 - 5.0 g/dL 4.0 3.8 3.7  AST 15 - 41 U/L 29 36 26  ALT 0 - 44 U/L _0 Alk Phosphatase 38 - 126 U/L 134(H) 116 112  Total Bilirubin 0.3 - 1.2 mg/dL  0.8 0.5 0.5  Bilirubin, Direct 0.0 - 0.3 mg/dL - - -    Lab Results  Component Value Date/Time   TSH 1.93 10/10/2014 09:35 AM   TSH 2.72 01/23/2014 09:42 AM    CBC Latest Ref Rng & Units 05/19/2021 02/16/2021 02/15/2021  WBC 4.0 - 10.5 K/uL 6.9 12.0(H) -  Hemoglobin 13.0 - 17.0 g/dL 10.4(L) 8.5(L) 8.4(L)  Hematocrit 39.0 - 52.0 % 30.9(L) 25.3(L) 25.4(L)  Platelets 150.0 - 400.0 K/uL 257.0 148(L) -    No results found for: VD25OH  Clinical ASCVD: No  The 10-year ASCVD risk score (Arnett DK, et al., 2019) is: 22.2%   Values used to calculate the score:     Age: 57 years     Sex: Male     Is Non-Hispanic African American: Yes     Diabetic: No     Tobacco smoker: No     Systolic Blood Pressure: 845 mmHg     Is BP treated: Yes     HDL Cholesterol: 48 mg/dL     Total Cholesterol: 191 mg/dL    Depression screen [facility] 2/9 06/23/2021 07/11/2020 06/24/2020  Decreased Interest 0 0 0  Down, Depressed, Hopeless 0 0 0  PHQ - 2 Score 0 0 0  Altered sleeping - 0 0  Tired, decreased energy - 1 0  Change in appetite - 0 0  Feeling bad or failure about yourself  - 0 0  Trouble concentrating - 0 0  Moving slowly or fidgety/restless - 0 0  Suicidal thoughts - 0 0  PHQ-9 Score - 1 0  Difficult doing work/chores - Not difficult at all Not difficult at all  Some recent data might be hidden     Social History   Tobacco Use  Smoking Status Never  Smokeless Tobacco Never   BP Readings from Last 3 Encounters:  10/07/21 124/68  07/23/21 128/70  06/23/21 122/72   Pulse Readings from Last 3 Encounters:  07/23/21 66  06/23/21 67  06/23/21 67   Wt Readings from Last 3 Encounters:  07/23/21 159 lb 3.2 oz (72.2 kg)  06/23/21 156 lb (70.8 kg)  06/23/21 156 lb (70.8 kg)   BMI Readings from Last 3 Encounters:  07/23/21 22.84 kg/m  06/23/21 22.38 kg/m  06/23/21 22.38 kg/m    Assessment/Interventions: Review of patient past medical history, allergies, medications, health status,  including review of consultants reports, laboratory and other test data, was performed as part of comprehensive evaluation and provision of chronic care management services.   SDOH:  (Social Determinants of Health) assessments and interventions performed: Yes SDOH Interventions    Flowsheet Row Most Recent Value  SDOH Interventions   Financial Strain Interventions Intervention Not Indicated  Transportation Interventions Intervention Not Indicated      SDOH Screenings   Alcohol Screen: Low Risk    Last Alcohol Screening Score (AUDIT): 0  Depression (PHQ2-9): Low Risk    PHQ-2 Score: 0  Financial Resource  Strain: Low Risk    Difficulty of Paying Living Expenses: Not hard at all  Food Insecurity: No Food Insecurity   Worried About Charity fundraiser in the Last Year: Never true   Ran Out of Food in the Last Year: Never true  Housing: Low Risk    Last Housing Risk Score: 0  Physical Activity: Insufficiently Active   Days of Exercise per Week: 2 days   Minutes of Exercise per Session: 20 min  Social Connections: Moderately Isolated   Frequency of Communication with Friends and Family: Three times a week   Frequency of Social Gatherings with Friends and Family: Three times a week   Attends Religious Services: Never   Active Member of Clubs or Organizations: No   Attends Archivist Meetings: Never   Marital Status: Married  Stress: No Stress Concern Present   Feeling of Stress : Not at all  Tobacco Use: Low Risk    Smoking Tobacco Use: Never   Smokeless Tobacco Use: Never   Passive Exposure: Not on file  Transportation Needs: No Transportation Needs   Lack of Transportation (Medical): No   Lack of Transportation (Non-Medical): No   Patient reports most of his day is spent taking taking care of his son's 3 boys. Him and his wife are the primary caregivers for them. They are 74, 83 and 77 years old. He also spends a lot of time playing pool and really enjoys this. He has  been playing since he was 32 and now owns a table. He has some friends come over to play sometimes and one of them recently passed aware.   Patient reports he also doing a lot around the house throughout the day including planting outside and doing yardwork.   Patient is not exercising right now but knows he needs to. 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 He was supposed to get a back injection recently and needs to follow up on this. His arthritis bothers him more than anything and he cannot walk very fast but he can walks.   Patient is sleeping pretty and goes to bed around 8-9:30pm and gets up 11pm but gets back to sleep quickly. He does not sleep during the day and is not tired during the day.  Patient reports tramadol doesn't make him sleepy at all and denies issues with his medications right now.  CCM Care Plan  Allergies  Allergen Reactions   Ferumoxytol Other (See Comments)    Difficulty breathing. Infusion reaction 01/24/21.   Nsaids Other (See Comments)     hx of renal insufficiency   Statins     myalgias    Medications Reviewed Today     Reviewed by [name], [facility] (Pharmacist) on 10/07/21 at 1205  Med List Status: <None>   Medication Order Taking? Sig Documenting Provider Last Dose Status Informant  acetaminophen (TYLENOL) 325 MG tablet [id number] No Take 1-2 tablets (325-650 mg total) by mouth every 6 (six) hours as needed for mild pain (pain score 1-3 or temp > 100.5).  Patient not taking: Reported on 10/07/2021   Dorothyann Peng, [place] Not Taking Active   allopurinol (ZYLOPRIM) 300 MG tablet [id number] Yes TAKE 1 TABLET BY MOUTH EVERY DAY [name], MD Taking Active   celecoxib (CELEBREX) 100 MG capsule [id number] Yes TAKE 1 CAPSULE (100 MG TOTAL) BY MOUTH 2 (TWO) TIMES DAILY AS NEEDED FOR MODERATE PAIN (SHOULDER).  Patient taking differently: Take 100  mg by mouth in the morning.   [name], MD Taking Active   colchicine 0.6  MG tablet [id number] No TAKE 1 TABLET BY MOUTH EVERY DAY  Patient not taking: Reported on 10/07/2021   [name], MD Not Taking Active   Cyanocobalamin 1000 MCG SUBL [id number] Yes Place 1 tablet (1,000 mcg total) under the tongue once a week.  Patient taking differently: Place 1,000 mcg under the tongue 2 (two) times a week. Wednesdays & Sundays   [name], MD Taking Active   Ferrous Sulfate (IRON PO) [id number] No Take 1 tablet by mouth in the morning and at bedtime.  Patient not taking: Reported on 10/07/2021   [provider] Not Taking Active Self  fluticasone (FLONASE) 50 MCG/ACT nasal spray [id number] No SPRAY 2 SPRAYS INTO EACH NOSTRIL EVERY DAY  Patient not taking: Reported on 10/07/2021   [name], MD Not Taking Active   losartan (COZAAR) 50 MG tablet [id number] Yes Take 1 tablet (50 mg total) by mouth daily.  Patient taking differently: Take 50 mg by mouth at bedtime.   [name], MD Taking Active   Polyethylene Glycol 3350 POWD [id number] Yes Take 17 g by mouth at bedtime. [provider] Taking Active Self  sildenafil (VIAGRA) 50 MG tablet [id number]  Take one half tab (25 mg) daily as needed 1 hour prior to sexual activity. [name], MD  Active   traMADol Veatrice Bourbon) 50 MG tablet [id number] Yes TAKE 2 TABLETS (100 MG TOTAL) BY MOUTH EVERY 12 (TWELVE) HOURS AS NEEDED. [name], MD Taking Active   triamterene-hydrochlorothiazide [facility]) 37.5-25 MG tablet [id number] Yes Take 1 tablet by mouth daily.  Patient taking differently: Take 1 tablet by mouth in the morning.   [name], MD Taking Active             Patient Active Problem List   Diagnosis Date Noted   Degenerative arthritis of left knee 02/13/2021   Osteoarthritis of left knee 12/03/2020   Erectile dysfunction 04/24/2020   S/P carpal tunnel release 04/24/2020   Low back pain 10/22/2014   Rotator cuff tendinitis 06/04/2014   Gout 06/17/2010   CKD (chronic  kidney disease) stage 3, GFR 30-59 ml/min (HCC) 06/04/2010   Anemia 08/02/2009   Hyperlipidemia 07/13/2007   Essential hypertension 07/13/2007   GERD 07/13/2007   B12 deficiency 07/07/2007    Immunization History  Administered Date(s) Administered   PFIZER(Purple Top)SARS-COV-2 Vaccination 12/02/2019, 12/30/2019, 08/17/2020   Pneumococcal Conjugate-13 01/30/2014   Pneumococcal Polysaccharide-23 06/16/2013   Td 01/18/2009    Conditions to be addressed/monitored:  Hypertension, Hyperlipidemia, GERD, Chronic Kidney Disease, Osteoarthritis, and Gout  Care Plan : [place]  Updates made by [name], [place] since 10/22/2021 12:00 AM     Problem: Problem: Hypertension, Hyperlipidemia, GERD, Chronic Kidney Disease, Osteoarthritis, and Gout      Long-Range Goal: Patient-Specific Goal   Start Date: 10/07/2021  Expected End Date: 10/07/2022  This Visit's Progress: On track  Priority: High  Note:   Current Barriers:  Unable to independently monitor therapeutic efficacy  Pharmacist Clinical Goal(s):  Patient will achieve adherence to monitoring guidelines and medication adherence to achieve therapeutic efficacy through collaboration with PharmD and provider.   Interventions: 1:1 collaboration with [name], MD regarding development and update of comprehensive plan of care as evidenced by provider attestation and co-signature Inter-disciplinary care team collaboration (see longitudinal plan of care) Comprehensive medication review performed; medication  list updated in electronic medical record  Hypertension (BP goal <130/80) -Controlled -Current treatment: Losartan 50 mg 1 tablet daily at bedtime Triamterene-HCTZ 37.5-25 mg 1 tablet daily in morning -Medications previously tried: n/a  -Current home readings: has an arm cuff but does not check often -Current dietary habits: does eat out often (usually breakfast at [employer]) -Current exercise habits: no  structured exercise -Denies hypotensive/hypertensive symptoms -Educated on BP goals and benefits of medications for prevention of heart attack, stroke and kidney damage; Importance of home blood pressure monitoring; Proper BP monitoring technique; Symptoms of hypotension and importance of maintaining adequate hydration; -Counseled to monitor BP at home weekly, document, and provide log at future appointments -Counseled on diet and exercise extensively Recommended to continue current medication  Gout (Goal: uric acid < 6 and prevent flare ups) -Controlled -Current treatment  Allopurinol 300 mg 1 tablet daily Colchicine 0.6 mg as needed -Medications previously tried: none  -Counseled on foods and beverages that can increase the risk of gout such as some seafood, red meat, and sweet beverages like tea. Recommended repeat uric acid level  Osteoarthritis (Goal: minimize pain) -Controlled -Current treatment  Celecoxib 100 mg 1 capsule 2 times daily as needed Tramadol 50 mg 2 tablets every 12 hours as needed Acetaminophen 500 mg doesn't take Aleve as needed -Medications previously tried: n/a  -Counseled on limiting use of NSAIDs due to risk of bleeding.  Health Maintenance -Vaccine gaps: shingrix, tetanus, COVID booster -Current therapy:  Vitamin B12 1000 mcg SL twice weekly Ferrous sulfate 1 tablet twice daily - not taking  Sildenafil 50 mg 1 tablet as needed Miralax as needed Flonase nasal spray as needed -Educated on Cost vs benefit of each product must be carefully weighed by individual consumer -Patient is satisfied with current therapy and denies issues -Recommended restarting iron supplementation.  Patient Goals/Self-Care Activities Patient will:  - take medications as prescribed as evidenced by patient report and record review check blood pressure weekly, document, and provide at future appointments target a minimum of 150 minutes of moderate intensity exercise  weekly  Follow Up Plan: Telephone follow up appointment with care management team member scheduled for: 6 months       Medication Assistance: None required.  Patient affirms current coverage meets needs.  Compliance/Adherence/Medication fill history: Care Gaps: COVID booster, shingrix, tetanus Last BP - 128/70 on 07/23/2021  Star-Rating Drugs: Losartan 50 mg - [facility]  [facility]:  [id number] [place], [id number]  [id number]: [phone]: [phone] Uses pill box? No - uses the bottle Pt endorses 95% compliance    We discussed: Current pharmacy is preferred with insurance plan and patient is satisfied with pharmacy services Patient decided to: Continue current medication management strategy  Care Plan and Follow Up Patient Decision:  Patient agrees to Care Plan and Follow-up.  Plan: Telephone follow up appointment with care management team member scheduled for:  6 months  MJeni Salles PharmD, [phone]

## 2021-11-14 ENCOUNTER — Other Ambulatory Visit: Payer: Self-pay | Admitting: Family Medicine

## 2021-11-27 ENCOUNTER — Other Ambulatory Visit: Payer: Self-pay | Admitting: Family Medicine

## 2021-11-27 DIAGNOSIS — G8929 Other chronic pain: Secondary | ICD-10-CM

## 2021-11-28 NOTE — Telephone Encounter (Signed)
Last Ov 07/23/21 Last filled 09/24/21 Is it ok to refill?

## 2022-01-23 ENCOUNTER — Other Ambulatory Visit: Payer: Self-pay | Admitting: Family Medicine

## 2022-01-26 ENCOUNTER — Other Ambulatory Visit: Payer: Self-pay | Admitting: Family Medicine

## 2022-01-26 DIAGNOSIS — J309 Allergic rhinitis, unspecified: Secondary | ICD-10-CM

## 2022-01-28 ENCOUNTER — Telehealth: Payer: Self-pay | Admitting: Pharmacist

## 2022-01-28 NOTE — Chronic Care Management (AMB) (Signed)
? ? ?Chronic Care Management ?Pharmacy Assistant  ? ?Name: Scott KAESER Sr.  MRN: [id number] DOB: Mar 15, 1946 ? ?Reason for Encounter: Disease State / Hypertension Assessment Call ?  ?Conditions to be addressed/monitored: ?HTN ? ? ?Recent office visits:  ?None ? ?Recent consult visits:  ?None ? ?Hospital visits:  ?None ? ?Medications: ?Outpatient Encounter Medications as of 01/28/2022  ?Medication Sig  ? acetaminophen (TYLENOL) 325 MG tablet Take 1-2 tablets (325-650 mg total) by mouth every 6 (six) hours as needed for mild pain (pain score 1-3 or temp > 100.5). (Patient not taking: Reported on 10/07/2021)  ? allopurinol (ZYLOPRIM) 300 MG tablet TAKE 1 TABLET BY MOUTH EVERY DAY  ? celecoxib (CELEBREX) 100 MG capsule TAKE 1 CAPSULE (100 MG TOTAL) BY MOUTH 2 (TWO) TIMES DAILY AS NEEDED FOR MODERATE PAIN (SHOULDER).  ? colchicine 0.6 MG tablet TAKE 1 TABLET BY MOUTH EVERY DAY (Patient not taking: Reported on 10/07/2021)  ? Cyanocobalamin 1000 MCG SUBL Place 1 tablet (1,000 mcg total) under the tongue once a week. (Patient taking differently: Place 1,000 mcg under the tongue 2 (two) times a week. Wednesdays & Sundays)  ? Ferrous Sulfate (IRON PO) Take 1 tablet by mouth in the morning and at bedtime. (Patient not taking: Reported on 10/07/2021)  ? fluticasone (FLONASE) 50 MCG/ACT nasal spray SPRAY 2 SPRAYS INTO EACH NOSTRIL EVERY DAY  ? losartan (COZAAR) 50 MG tablet Take 1 tablet (50 mg total) by mouth daily. (Patient taking differently: Take 50 mg by mouth at bedtime.)  ? Polyethylene Glycol 3350 POWD Take 17 g by mouth at bedtime.  ? sildenafil (VIAGRA) 50 MG tablet Take one half tab (25 mg) daily as needed 1 hour prior to sexual activity.  ? traMADol (ULTRAM) 50 MG tablet TAKE 2 TABLETS (100 MG TOTAL) BY MOUTH EVERY 12 (TWELVE) HOURS AS NEEDED.  ? triamterene-hydrochlorothiazide (MAXZIDE-25) 37.5-25 MG tablet Take 1 tablet by mouth daily. (Patient taking differently: Take 1 tablet by mouth in the morning.)  ? ?No  facility-administered encounter medications on file as of 01/28/2022.  ?Fill History: ?LOSARTAN POTASSIUM 50 MG TAB 10/31/2021 90  ? ?ALLOPURINOL 300 MG TABLET 11/23/2021 90  ? ?CELECOXIB 100 MG CAPSULE 11/14/2021 30  ? ?COLCHICINE 0.6 MG TABLET 09/03/2020 20  ? ?FLUTICASONE PROPIONATE  50 MCG/ACT SUSP 04/21/2020 90  ? ?SILDENAFIL 50 MG TABLET 04/24/2020 7  ? ?TRIAMTERENE-HCTZ 37.5-25 MG TB 10/09/2021 90  ? ?TRAMADOL HCL 50 MG TABLET 01/08/2022 30  ? ?Reviewed chart prior to disease state call. Spoke with patient regarding BP ? ?Recent Office Vitals: ?BP Readings from Last 3 Encounters:  ?10/07/21 124/68  ?07/23/21 128/70  ?06/23/21 122/72  ? ?Pulse Readings from Last 3 Encounters:  ?07/23/21 66  ?06/23/21 67  ?06/23/21 67  ?  ?Wt Readings from Last 3 Encounters:  ?07/23/21 159 lb 3.2 oz (72.2 kg)  ?06/23/21 156 lb (70.8 kg)  ?06/23/21 156 lb (70.8 kg)  ?  ? ?Kidney Function ?Lab Results  ?Component Value Date/Time  ? CREATININE 1.83 (H) 05/30/2021 04:39 PM  ? CREATININE 1.30 05/28/2021 01:58 PM  ? CREATININE 1.21 05/19/2021 11:53 AM  ? CREATININE 1.20 01/13/2021 11:16 AM  ? CREATININE 1.66 (H) 07/19/2020 10:28 AM  ? GFR 53.02 (L) 05/28/2021 01:58 PM  ? GFRNONAA >60 02/14/2021 02:54 AM  ? GFRNONAA >60 01/13/2021 11:16 AM  ? GFRNONAA 39 (L) 07/19/2020 10:28 AM  ? GFRAA 46 (L) 07/19/2020 10:28 AM  ? ? ? ?  Latest Ref Rng & Units 05/30/2021  ?  4:39 PM 05/28/2021  ?  1:58 PM 05/19/2021  ? 11:53 AM  ?BMP  ?Glucose 65 - 99 mg/dL 90   73   90    ?BUN 7 - 25 mg/dL '21   16   14    '$ ?Creatinine 0.70 - 1.28 mg/dL 1.83   1.30   1.21    ?BUN/Creat Ratio 6 - 22 (calc) 11      ?Sodium 135 - 146 mmol/L 132   128   130    ?Potassium 3.5 - 5.3 mmol/L 4.3   3.7   4.0    ?Chloride 98 - 110 mmol/L 95   93   94    ?CO2 20 - 32 mmol/L '28   26   29    '$ ?Calcium 8.6 - 10.3 mg/dL 9.3   9.5   9.2    ? ? ?Current antihypertensive regimen:  ?Losartan 50 mg daily ?Maxzide 37.5/25 mg daily ? ?How often are you checking your Blood Pressure? Patient  states he is not checking blood pressures at home. He was advised to start checking blood pressures at least weekly and recording the readings. Patient states he will be better about this. ? ?Current home BP readings: Patient is not checking ? ?What recent interventions/DTPs have been made by any provider to improve Blood Pressure control since last CPP Visit: No recent interventions ? ?Any recent hospitalizations or ED visits since last visit with CPP? No recent hospital visits.  ? ?What diet changes have been made to improve Blood Pressure Control?  ?Patient follows no specific diet ?Breakfast - patient will have a hashbrown sometimes ?Lunch - patient will have nothing, he doesn't eat until dinner ?Dinner - patient will have a meal containing a meat and vegetable  ? ?What exercise is being done to improve your Blood Pressure Control?  ?Patient states he is active daily, doing projects, yard work and house work.  ? ?Adherence Review: ?Is the patient currently on ACE/ARB medication? Yes ?Does the patient have >5 day gap between last estimated fill dates? No ? ? ?Care Gaps: ?AWV - completed 06/23/2021 ?Last BP - 124/68 on 10/07/2021 ?Shingrix - never done ?TDAP - overdue ?Covid vaccine - overdue ? ?Star Rating Drugs: ?Losartan 50 mg - [facility] ? ?  ?Gennie Alma CMA  ?Clinical Pharmacist Assistant ?[phone] ? ?

## 2022-01-31 ENCOUNTER — Other Ambulatory Visit: Payer: Self-pay | Admitting: Family Medicine

## 2022-01-31 DIAGNOSIS — I10 Essential (primary) hypertension: Secondary | ICD-10-CM

## 2022-02-12 ENCOUNTER — Other Ambulatory Visit: Payer: Self-pay | Admitting: Family Medicine

## 2022-02-12 DIAGNOSIS — G8929 Other chronic pain: Secondary | ICD-10-CM

## 2022-02-12 DIAGNOSIS — M545 Low back pain, unspecified: Secondary | ICD-10-CM

## 2022-02-12 DIAGNOSIS — I10 Essential (primary) hypertension: Secondary | ICD-10-CM

## 2022-04-03 ENCOUNTER — Other Ambulatory Visit: Payer: Self-pay | Admitting: Family Medicine

## 2022-04-03 DIAGNOSIS — I10 Essential (primary) hypertension: Secondary | ICD-10-CM

## 2022-04-14 ENCOUNTER — Telehealth: Payer: Self-pay | Admitting: Pharmacist

## 2022-04-14 NOTE — Chronic Care Management (AMB) (Signed)
    Chronic Care Management Pharmacy Assistant   Name: Scott TEDESCO Sr.  MRN: [id number] DOB: Nov 18, 1945  04/15/2022 APPOINTMENT [place]., No answer, unable to leave message of appointment on 04/15/2022 at 10:30 via telephone visit with Jeni Salles, Pharm D. No voice mail for home or cell.    Care Gaps: AWV - completed 06/23/2021 Last BP - 128/70 on 07/23/2021 Shingrix - never done Tdap - overdue Covid booster - overdue  Star Rating Drug: Losartan 50 mg - last [facility]  Any gaps in medications fill history? No  Gennie Alma [facility]  Catering manager [phone]

## 2022-04-15 ENCOUNTER — Ambulatory Visit: Payer: Medicare Other | Admitting: Pharmacist

## 2022-04-15 VITALS — BP 126/68

## 2022-04-15 DIAGNOSIS — Z862 Personal history of diseases of the blood and blood-forming organs and certain disorders involving the immune mechanism: Secondary | ICD-10-CM

## 2022-04-15 DIAGNOSIS — I10 Essential (primary) hypertension: Secondary | ICD-10-CM

## 2022-04-15 NOTE — Progress Notes (Signed)
Chronic Care Management Pharmacy Note  04/15/2022 Name:  Scott ECKARD Sr. MRN:  [id number] DOB:  1946-05-21  Summary: BP is at goal < 140/90 per office readings but does not check at home Pt is still not taking iron supplementation  Recommendations/Changes made from today's visit: -Recommended routine BP monitoring at home -Recommended restarting iron supplementation -Recommend repeat uric acid level  Plan: BP assessment in 3 months Follow up in 6 months  Subjective: Scott Ransom Sr. is an 76 y.o. year old male who is a primary patient of [name], MD.  The CCM team was consulted for assistance with disease management and care coordination needs.    Engaged with patient face to face for follow up visit in response to provider referral for pharmacy case management and/or care coordination services.   Consent to Services:  The patient was given information about Chronic Care Management services, agreed to services, and gave verbal consent prior to initiation of services.  Please see initial visit note for detailed documentation.   Patient Care Team: [name], MD as PCP - General (Family Medicine) [name], [facility] as Pharmacist (Pharmacist)  Recent office visits: 07/23/2021 Grier Mitts MD - Patient was seen for PVD and additional issues. Referred to PT. No medication changes. Follow up if symptoms worsen or fail to improve.   06/23/2021 Grier Mitts MD - Patient was seen for Stage 3 chronic kidney disease. No medication changes. Follow up if symptoms worsen or fail to improve.   06/23/2021 Randel Pigg LPN - Medicare annual wellness exam   05/19/2021 Grier Mitts MD - Patient was seen for chronic right shoulder pain and additional issues. Referred to ortho surgery. Increased Tramadol to 50 mg take 2 tablets twice daily. Follow up if symptoms worsen or fail to improve.  Recent consult visits: 09/23/21 Rod Can (ortho): Patient presented for  osteoarthritis of right knee. Unable to access notes.  08/14/21 Levy Pupa, PA (emergeortho): Patient presented for lumbar radiculopathy. Unable to access notes.    Hospital visits: None in previous 6 months   Objective:  Lab Results  Component Value Date   CREATININE 1.83 (H) 05/30/2021   BUN 21 05/30/2021   GFR 53.02 (L) 05/28/2021   GFRNONAA >60 02/14/2021   GFRAA 46 (L) 07/19/2020   NA 132 (L) 05/30/2021   K 4.3 05/30/2021   CALCIUM 9.3 05/30/2021   CO2 28 05/30/2021   GLUCOSE 90 05/30/2021    Lab Results  Component Value Date/Time   HGBA1C 6.3 12/02/2020 04:28 PM   HGBA1C 6.2 06/15/2018 02:44 PM   GFR 53.02 (L) 05/28/2021 01:58 PM   GFR 57.80 (L) 05/19/2021 11:53 AM    Last diabetic Eye exam: No results found for: "HMDIABEYEEXA"  Last diabetic Foot exam: No results found for: "HMDIABFOOTEX"   Lab Results  Component Value Date   CHOL 191 07/19/2020   HDL 48 07/19/2020   LDLCALC 127 (H) 07/19/2020   LDLDIRECT 131.0 01/24/2016   TRIG 70 07/19/2020   CHOLHDL 4.0 07/19/2020       Latest Ref Rng & Units 02/06/2021    8:22 AM 01/24/2021    3:05 PM 01/13/2021   11:16 AM  Hepatic Function  Total Protein 6.5 - 8.1 g/dL 7.4  7.2  6.7   Albumin 3.5 - 5.0 g/dL 4.0  3.8  3.7   AST 15 - 41 U/L 29  36  26   ALT 0 - 44 U/L 21  20  18  Alk Phosphatase 38 - 126 U/L 134  116  112   Total Bilirubin 0.3 - 1.2 mg/dL 0.8  0.5  0.5     Lab Results  Component Value Date/Time   TSH 1.93 10/10/2014 09:35 AM   TSH 2.72 01/23/2014 09:42 AM       Latest Ref Rng & Units 05/19/2021   11:53 AM 02/16/2021    5:54 AM 02/15/2021    2:55 PM  CBC  WBC 4.0 - 10.5 K/uL 6.9  12.0    Hemoglobin 13.0 - 17.0 g/dL 10.4  8.5  8.4   Hematocrit 39.0 - 52.0 % 30.9  25.3  25.4   Platelets 150.0 - 400.0 K/uL 257.0  148      No results found for: "VD25OH"  Clinical ASCVD: No  The 10-year ASCVD risk score (Arnett DK, et al., 2019) is: 23.4%   Values used to calculate the score:      Age: 30 years     Sex: Male     Is Non-Hispanic African American: Yes     Diabetic: No     Tobacco smoker: No     Systolic Blood Pressure: 324 mmHg     Is BP treated: Yes     HDL Cholesterol: 48 mg/dL     Total Cholesterol: 191 mg/dL       06/23/2021   10:47 AM 07/11/2020   10:48 AM 06/24/2020    3:42 PM  Depression screen PHQ 2/9  Decreased Interest 0 0 0  Down, Depressed, Hopeless 0 0 0  PHQ - 2 Score 0 0 0  Altered sleeping  0 0  Tired, decreased energy  1 0  Change in appetite  0 0  Feeling bad or failure about yourself   0 0  Trouble concentrating  0 0  Moving slowly or fidgety/restless  0 0  Suicidal thoughts  0 0  PHQ-9 Score  1 0  Difficult doing work/chores  Not difficult at all Not difficult at all     Social History   Tobacco Use  Smoking Status Never  Smokeless Tobacco Never   BP Readings from Last 3 Encounters:  04/15/22 126/68  10/07/21 124/68  07/23/21 128/70   Pulse Readings from Last 3 Encounters:  07/23/21 66  06/23/21 67  06/23/21 67   Wt Readings from Last 3 Encounters:  07/23/21 159 lb 3.2 oz (72.2 kg)  06/23/21 156 lb (70.8 kg)  06/23/21 156 lb (70.8 kg)   BMI Readings from Last 3 Encounters:  07/23/21 22.84 kg/m  06/23/21 22.38 kg/m  06/23/21 22.38 kg/m    Assessment/Interventions: Review of patient past medical history, allergies, medications, health status, including review of consultants reports, laboratory and other test data, was performed as part of comprehensive evaluation and provision of chronic care management services.   SDOH:  (Social Determinants of Health) assessments and interventions performed: Yes   SDOH Screenings   Alcohol Screen: Low Risk  (06/23/2021)   Alcohol Screen    Last Alcohol Screening Score (AUDIT): 0  Depression (PHQ2-9): Low Risk  (06/23/2021)   Depression (PHQ2-9)    PHQ-2 Score: 0  Financial Resource Strain: Low Risk  (10/22/2021)   Overall Financial Resource Strain (CARDIA)    Difficulty of  Paying Living Expenses: Not hard at all  Food Insecurity: No Food Insecurity (06/23/2021)   Hunger Vital Sign    Worried About Running Out of Food in the Last Year: Never true    Ran Out of Food in  the Last Year: Never true  Housing: Low Risk  (06/23/2021)   Housing    Last Housing Risk Score: 0  Physical Activity: Insufficiently Active (06/23/2021)   Exercise Vital Sign    Days of Exercise per Week: 2 days    Minutes of Exercise per Session: 20 min  Social Connections: Moderately Isolated (06/23/2021)   Social Connection and Isolation Panel [NHANES]    Frequency of Communication with Friends and Family: Three times a week    Frequency of Social Gatherings with Friends and Family: Three times a week    Attends Religious Services: Never    Active Member of Clubs or Organizations: No    Attends Archivist Meetings: Never    Marital Status: Married  Stress: No Stress Concern Present (06/23/2021)   [place]    Feeling of Stress : Not at all  Tobacco Use: Low Risk  (07/23/2021)   Patient History    Smoking Tobacco Use: Never    Smokeless Tobacco Use: Never    Passive Exposure: Not on file  Transportation Needs: No Transportation Needs (10/22/2021)   PRAPARE - Transportation    Lack of Transportation (Medical): No    Lack of Transportation (Non-Medical): No    CCM Care Plan  Allergies  Allergen Reactions   Ferumoxytol Other (See Comments)    Difficulty breathing. Infusion reaction 01/24/21.   Nsaids Other (See Comments)     hx of renal insufficiency   Statins     myalgias    Medications Reviewed Today     Reviewed by [name], [facility] (Pharmacist) on 04/15/22 at 20  Med List Status: <None>   Medication Order Taking? Sig Documenting Provider Last Dose Status Informant  acetaminophen (TYLENOL) 325 MG tablet [id number]  Take 1-2 tablets (325-650 mg total) by mouth every 6 (six) hours as needed for  mild pain (pain score 1-3 or temp > 100.5).  Patient not taking: Reported on 10/07/2021   Ronny Bacon  Active   allopurinol (ZYLOPRIM) 300 MG tablet [id number]  TAKE 1 TABLET BY MOUTH EVERY DAY [name], MD  Active   celecoxib (CELEBREX) 100 MG capsule [id number]  TAKE 1 CAPSULE (100 MG TOTAL) BY MOUTH 2 (TWO) TIMES DAILY AS NEEDED FOR MODERATE PAIN (SHOULDER). [name], MD  Active   colchicine 0.6 MG tablet [id number]  TAKE 1 TABLET EVERY DAY [name], MD  Active   Cyanocobalamin 1000 MCG SUBL [id number]  Place 1 tablet (1,000 mcg total) under the tongue once a week.  Patient taking differently: Place 1,000 mcg under the tongue 2 (two) times a week. Wednesdays & Sundays   [name], MD  Active   Ferrous Sulfate (IRON PO) [id number]  Take 1 tablet by mouth in the morning and at bedtime.  Patient not taking: Reported on 10/07/2021   [provider]  Active Self  fluticasone (FLONASE) 50 MCG/ACT nasal spray [id number]  SPRAY 2 SPRAYS INTO EACH NOSTRIL EVERY DAY [name], MD  Active   losartan (COZAAR) 50 MG tablet [id number]  TAKE 1 TABLET BY MOUTH EVERY DAY [name], MD  Active   Polyethylene Glycol 3350 POWD [id number]  Take 17 g by mouth at bedtime. [provider]  Active Self  sildenafil (VIAGRA) 50 MG tablet [id number]  Take one half tab (25 mg) daily as needed 1 hour prior to sexual activity. [name], MD  Active  traMADol (ULTRAM) 50 MG tablet [id number]  TAKE 2 TABLETS (100 MG TOTAL) BY MOUTH EVERY 12 (TWELVE) HOURS AS NEEDED. [name], MD  Active   triamterene-hydrochlorothiazide [facility]) 37.5-25 MG tablet [id number]  TAKE 1 TABLET BY MOUTH EVERY DAY [name], MD  Active             Patient Active Problem List   Diagnosis Date Noted   Degenerative arthritis of left knee 02/13/2021   Osteoarthritis of left knee 12/03/2020   Erectile dysfunction 04/24/2020   S/P carpal tunnel release  04/24/2020   Low back pain 10/22/2014   Rotator cuff tendinitis 06/04/2014   Gout 06/17/2010   CKD (chronic kidney disease) stage 3, GFR 30-59 ml/min (HCC) 06/04/2010   Anemia 08/02/2009   Hyperlipidemia 07/13/2007   Essential hypertension 07/13/2007   GERD 07/13/2007   B12 deficiency 07/07/2007    Immunization History  Administered Date(s) Administered   PFIZER(Purple Top)SARS-COV-2 Vaccination 12/02/2019, 12/30/2019, 08/17/2020   Pneumococcal Conjugate-13 01/30/2014   Pneumococcal Polysaccharide-23 06/16/2013   Td 01/18/2009    Conditions to be addressed/monitored:  Hypertension, Hyperlipidemia, GERD, Chronic Kidney Disease, Osteoarthritis, and Gout  Conditions addressed this visit: Hypertension, gout, anemia  Care Plan : [place]  Updates made by [name], [place] since 04/15/2022 12:00 AM     Problem: Problem: Hypertension, Hyperlipidemia, GERD, Chronic Kidney Disease, Osteoarthritis, and Gout      Long-Range Goal: Patient-Specific Goal   Start Date: 10/07/2021  Expected End Date: 10/07/2022  Recent Progress: On track  Priority: High  Note:   Current Barriers:  Unable to independently monitor therapeutic efficacy Unable to self administer medications as prescribed  Pharmacist Clinical Goal(s):  Patient will achieve adherence to monitoring guidelines and medication adherence to achieve therapeutic efficacy through collaboration with PharmD and provider.   Interventions: 1:1 collaboration with [name], MD regarding development and update of comprehensive plan of care as evidenced by provider attestation and co-signature Inter-disciplinary care team collaboration (see longitudinal plan of care) Comprehensive medication review performed; medication list updated in electronic medical record  Hypertension (BP goal <130/80) -Controlled -Current treatment: Losartan 50 mg 1 tablet daily at bedtime - Appropriate, Effective, Safe,  Accessible Triamterene-HCTZ 37.5-25 mg 1 tablet daily in morning - Appropriate, Effective, Safe, Accessible -Medications previously tried: n/a  -Current home readings: has an arm cuff but does not check often -Current dietary habits: does eat out often (usually breakfast at [employer]) -Current exercise habits: no structured exercise -Denies hypotensive/hypertensive symptoms -Educated on BP goals and benefits of medications for prevention of heart attack, stroke and kidney damage; Importance of home blood pressure monitoring; Proper BP monitoring technique; Symptoms of hypotension and importance of maintaining adequate hydration; -Counseled to monitor BP at home weekly, document, and provide log at future appointments -Counseled on diet and exercise extensively Recommended to continue current medication  Gout (Goal: uric acid < 6 and prevent flare ups) -Controlled -Current treatment  Allopurinol 300 mg 1 tablet daily - Appropriate, Effective, Safe, Accessible Colchicine 0.6 mg as needed - Appropriate, Effective, Safe, Accessible -Medications previously tried: none  -Counseled on foods and beverages that can increase the risk of gout such as some seafood, red meat, and sweet beverages like tea. Recommended repeat uric acid level  Osteoarthritis (Goal: minimize pain) -Controlled -Current treatment  Celecoxib 100 mg 1 capsule 2 times daily as needed - Appropriate, Effective, Query Safe, Accessible Tramadol 50 mg 2 tablets every 12 hours as needed - Appropriate, Effective, Safe, Accessible -  Medications previously tried: n/a  -Counseled on limiting use of NSAIDs due to risk of bleeding.  Health Maintenance -Vaccine gaps: shingrix, tetanus, COVID booster -Current therapy:  Vitamin B12 1000 mcg SL twice weekly Ferrous sulfate 1 tablet twice daily - not taking  Sildenafil 50 mg 1 tablet as needed Miralax as needed Flonase nasal spray as needed -Educated on Cost vs benefit of each  product must be carefully weighed by individual consumer -Patient is satisfied with current therapy and denies issues -Recommended restarting iron supplementation.  Patient Goals/Self-Care Activities Patient will:  - take medications as prescribed as evidenced by patient report and record review check blood pressure weekly, document, and provide at future appointments target a minimum of 150 minutes of moderate intensity exercise weekly  Follow Up Plan: Telephone follow up appointment with care management team member scheduled for: 6 months      Medication Assistance: None required.  Patient affirms current coverage meets needs.  Compliance/Adherence/Medication fill history: Care Gaps: COVID booster, shingrix, tetanus Last BP - 128/70 on 07/23/2021  Star-Rating Drugs: Losartan 50 mg - last [facility]  Patient's preferred pharmacy is:  [id number] [place], [place] [id number]: [phone]: [phone] Uses pill box? No - uses the bottle Pt endorses 95% compliance    We discussed: Current pharmacy is preferred with insurance plan and patient is satisfied with pharmacy services Patient decided to: Continue current medication management strategy  Care Plan and Follow Up Patient Decision:  Patient agrees to Care Plan and Follow-up.  Plan: Telephone follow up appointment with care management team member scheduled for:  6 months  MJeni Salles PharmD, [address] [place] [phone]

## 2022-04-15 NOTE — Patient Instructions (Addendum)
Look for ferrous sulfate 325 mg at the pharmacy to take twice daily - this is to help with your iron Try to get your shingles shot and tetanus shot at the pharmacy  [place], PharmD, [place] [facility] [place]

## 2022-04-20 DIAGNOSIS — M1711 Unilateral primary osteoarthritis, right knee: Secondary | ICD-10-CM | POA: Diagnosis not present

## 2022-04-20 DIAGNOSIS — Z96652 Presence of left artificial knee joint: Secondary | ICD-10-CM | POA: Diagnosis not present

## 2022-04-20 DIAGNOSIS — M25512 Pain in left shoulder: Secondary | ICD-10-CM | POA: Diagnosis not present

## 2022-04-24 ENCOUNTER — Ambulatory Visit (INDEPENDENT_AMBULATORY_CARE_PROVIDER_SITE_OTHER): Payer: Medicare Other | Admitting: Family Medicine

## 2022-04-24 ENCOUNTER — Encounter: Payer: Self-pay | Admitting: Family Medicine

## 2022-04-24 VITALS — BP 128/68 | HR 99 | Temp 97.8°F | Ht 68.5 in | Wt 161.0 lb

## 2022-04-24 DIAGNOSIS — Z125 Encounter for screening for malignant neoplasm of prostate: Secondary | ICD-10-CM | POA: Diagnosis not present

## 2022-04-24 DIAGNOSIS — E782 Mixed hyperlipidemia: Secondary | ICD-10-CM

## 2022-04-24 DIAGNOSIS — G8929 Other chronic pain: Secondary | ICD-10-CM | POA: Diagnosis not present

## 2022-04-24 DIAGNOSIS — N1831 Chronic kidney disease, stage 3a: Secondary | ICD-10-CM | POA: Diagnosis not present

## 2022-04-24 DIAGNOSIS — K029 Dental caries, unspecified: Secondary | ICD-10-CM

## 2022-04-24 DIAGNOSIS — R011 Cardiac murmur, unspecified: Secondary | ICD-10-CM | POA: Diagnosis not present

## 2022-04-24 DIAGNOSIS — R634 Abnormal weight loss: Secondary | ICD-10-CM

## 2022-04-24 DIAGNOSIS — Z789 Other specified health status: Secondary | ICD-10-CM | POA: Diagnosis not present

## 2022-04-24 DIAGNOSIS — Z0000 Encounter for general adult medical examination without abnormal findings: Secondary | ICD-10-CM

## 2022-04-24 DIAGNOSIS — Z8639 Personal history of other endocrine, nutritional and metabolic disease: Secondary | ICD-10-CM | POA: Diagnosis not present

## 2022-04-24 DIAGNOSIS — D171 Benign lipomatous neoplasm of skin and subcutaneous tissue of trunk: Secondary | ICD-10-CM | POA: Diagnosis not present

## 2022-04-24 DIAGNOSIS — Z0001 Encounter for general adult medical examination with abnormal findings: Secondary | ICD-10-CM | POA: Diagnosis not present

## 2022-04-24 DIAGNOSIS — M25512 Pain in left shoulder: Secondary | ICD-10-CM

## 2022-04-24 DIAGNOSIS — L989 Disorder of the skin and subcutaneous tissue, unspecified: Secondary | ICD-10-CM

## 2022-04-24 LAB — CBC WITH DIFFERENTIAL/PLATELET
Basophils Absolute: 0 10*3/uL (ref 0.0–0.1)
Basophils Relative: 0.7 % (ref 0.0–3.0)
Eosinophils Absolute: 0.1 10*3/uL (ref 0.0–0.7)
Eosinophils Relative: 1.5 % (ref 0.0–5.0)
HCT: 35.1 % — ABNORMAL LOW (ref 39.0–52.0)
Hemoglobin: 11.5 g/dL — ABNORMAL LOW (ref 13.0–17.0)
Lymphocytes Relative: 22.1 % (ref 12.0–46.0)
Lymphs Abs: 0.9 10*3/uL (ref 0.7–4.0)
MCHC: 32.8 g/dL (ref 30.0–36.0)
MCV: 85.2 fl (ref 78.0–100.0)
Monocytes Absolute: 0.5 10*3/uL (ref 0.1–1.0)
Monocytes Relative: 12.3 % — ABNORMAL HIGH (ref 3.0–12.0)
Neutro Abs: 2.7 10*3/uL (ref 1.4–7.7)
Neutrophils Relative %: 63.4 % (ref 43.0–77.0)
Platelets: 224 10*3/uL (ref 150.0–400.0)
RBC: 4.12 Mil/uL — ABNORMAL LOW (ref 4.22–5.81)
RDW: 16 % — ABNORMAL HIGH (ref 11.5–15.5)
WBC: 4.2 10*3/uL (ref 4.0–10.5)

## 2022-04-24 LAB — COMPREHENSIVE METABOLIC PANEL
ALT: 17 U/L (ref 0–53)
AST: 29 U/L (ref 0–37)
Albumin: 4.2 g/dL (ref 3.5–5.2)
Alkaline Phosphatase: 132 U/L — ABNORMAL HIGH (ref 39–117)
BUN: 13 mg/dL (ref 6–23)
CO2: 31 mEq/L (ref 19–32)
Calcium: 9.5 mg/dL (ref 8.4–10.5)
Chloride: 92 mEq/L — ABNORMAL LOW (ref 96–112)
Creatinine, Ser: 1.19 mg/dL (ref 0.40–1.50)
GFR: 58.58 mL/min — ABNORMAL LOW (ref >60.00)
Glucose, Bld: 90 mg/dL (ref 70–99)
Potassium: 4.3 mEq/L (ref 3.5–5.1)
Sodium: 129 mEq/L — ABNORMAL LOW (ref 135–145)
Total Bilirubin: 0.6 mg/dL (ref 0.2–1.2)
Total Protein: 7.1 g/dL (ref 6.0–8.3)

## 2022-04-24 LAB — HEMOGLOBIN A1C: Hgb A1c MFr Bld: 5.9 % (ref 4.6–6.5)

## 2022-04-24 LAB — LIPID PANEL
Cholesterol: 196 mg/dL (ref 0–200)
HDL: 53.8 mg/dL (ref >39.00)
LDL Cholesterol: 122 mg/dL — ABNORMAL HIGH (ref 0–99)
NonHDL: 142.06
Total CHOL/HDL Ratio: 4
Triglycerides: 102 mg/dL (ref 0.0–149.0)
VLDL: 20.4 mg/dL (ref 0.0–40.0)

## 2022-04-24 LAB — T4, FREE: Free T4: 0.84 ng/dL (ref 0.60–1.60)

## 2022-04-24 LAB — PSA: PSA: 4.36 ng/mL — ABNORMAL HIGH (ref 0.10–4.00)

## 2022-04-24 LAB — TSH: TSH: 1.39 u[IU]/mL (ref 0.35–5.50)

## 2022-04-24 NOTE — Progress Notes (Signed)
Subjective:   Pt's young grandson, Herbie [place] present this visit.     Scott FENDLEY Sr. is a 76 y.o. male and is here for a comprehensive physical exam. The patient reports b/l shoulder pain, L>R.  Pt states he was seen by Ortho earlier this wk, but they did not do anything.  Unable to raise arms above head.  Pt also with continued L knee issues s/p TKR.  Pt endorses wt loss.  Has decreased appetite.  May eat one meal in evenining and snack/sweets during the day.  Pt endorses intermittent dental infection, typically causes R upper jaw discomfort with opening and closing mouth.  Was given abx by dentist, but did not complete course, only took a few pills in case symptoms returned.  Pt with a round lesion on nose.  States hasn't noticed.  Per pt's grandson, present x last few months.  Pt active doing yard work or cleaning the car most days.  Pt states nurse with insurance company stopped by the house for screenings.  Foot exam normal during visit.  Patient has moderate fall risk.  Social History   Socioeconomic History   Marital status: Married    Spouse name: [place]   Number of children: 4   Years of education: Not on file   Highest education level: Some college, no degree  Occupational History    Employer: RETIRED  Tobacco Use   Smoking status: Never   Smokeless tobacco: Never  Vaping Use   Vaping Use: Never used  Substance and Sexual Activity   Alcohol use: No   Drug use: No   Sexual activity: Not on file  Other Topics Concern   Not on file  Social History Narrative   Married 1965. 4 children. 11 grandchildren. 1 greatgrandchild on way in 2015.       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 He is married, lives with his wife. He states he stops every morning and gets a 52oz tea. He walks most days.   Social Determinants of Health   Financial Resource Strain: Low Risk  (10/22/2021)   Overall Financial Resource Strain (CARDIA)     Difficulty of Paying Living Expenses: Not hard at all  Food Insecurity: No Food Insecurity (06/23/2021)   Hunger Vital Sign    Worried About Running Out of Food in the Last Year: Never true    [place] in the Last Year: Never true  Transportation Needs: No Transportation Needs (10/22/2021)   PRAPARE - Hydrologist (Medical): No    Lack of Transportation (Non-Medical): No  Physical Activity: Insufficiently Active (06/23/2021)   Exercise Vital Sign    Days of Exercise per Week: 2 days    Minutes of Exercise per Session: 20 min  Stress: No Stress Concern Present (06/23/2021)   [place]    Feeling of Stress : Not at all  Social Connections: Moderately Isolated (06/23/2021)   Social Connection and Isolation Panel [NHANES]    Frequency of Communication with Friends and Family: Three times a week    Frequency of Social Gatherings with Friends and Family: Three times a week    Attends Religious Services: Never    Active Member of Clubs or Organizations: No    Attends Archivist Meetings: Never    Marital Status: Married  Human resources officer Violence: Not At Risk (  06/23/2021)   Humiliation, Afraid, Rape, and Kick questionnaire    Fear of Current or Ex-Partner: No    Emotionally Abused: No    Physically Abused: No    Sexually Abused: No   Health Maintenance  Topic Date Due   Zoster Vaccines- Shingrix (1 of 2) Never done   TETANUS/TDAP  01/19/2019   COVID-19 Vaccine (4 - Booster for Pfizer series) 10/12/2020   INFLUENZA VACCINE  06/02/2022   Pneumonia Vaccine 31+ Years old  Completed   Hepatitis C Screening  Completed   HPV VACCINES  Aged Out   COLONOSCOPY (Pts 45-78yr Insurance coverage will need to be confirmed)  Discontinued    The following portions of the patient's history were reviewed and updated as appropriate: allergies, current medications, past family history, past  medical history, past social history, past surgical history, and problem list.  Review of Systems Pertinent items noted in HPI and remainder of comprehensive ROS otherwise negative.   Objective:    BP 128/68 (BP Location: Right Arm, Patient Position: Sitting, Cuff Size: Normal)   Pulse 99   Temp 97.8 F (36.6 C) (Oral)   Ht 5' 8.5" (1.74 m)   Wt 161 lb (73 kg)   SpO2 99%   BMI 24.12 kg/m  General appearance: alert, cooperative, and no distress Head: Normocephalic, without obvious abnormality, atraumatic Eyes: conjunctivae/corneas clear. PERRL, EOM's intact. Fundi benign. Arcus senilis b/l. Ears: normal TM's and external ear canals both ears Nose: Nares normal. Septum midline. Mucosa normal. No drainage or sinus tenderness., Smooth, raised, clear papule with a hair in central indentation  inferior to bridge of R nose.   Throat: lips, mucosa, and tongue normal;several teeth missing, carries, pinpoint hyperpigmented spot on L distal tongue. Neck: no adenopathy, no carotid bruit, no JVD, supple, symmetrical, trachea midline, and thyroid not enlarged, symmetric, no tenderness/mass/nodules Lungs: clear to auscultation bilaterally Heart: regular rate and rhythm, 2/6 murmur heard throughout, no click, rub or gallop Abdomen: soft, non-tender; bowel sounds normal; no masses,  no organomegaly Extremities: extremities normal, atraumatic, no cyanosis or edema Pulses: 2+ and symmetric Skin:  Warm, dry, intact.  Lipoma of right upper mid back.  Round, smooth, raised, clearish papule with hair and central indentation inferior to bridge of right nose without erythema, drainage, irritation Lymph nodes: Cervical, supraclavicular, and axillary nodes normal. Neurologic: Alert and oriented X 3, normal strength and tone. Normal symmetric reflexes. Normal coordination and gait    Assessment:    Healthy male exam with bilateral shoulder pain left greater than right, no lipoma, new skin lesion on nose  murmur, dental caries.   Plan:    Anticipatory guidance given including wearing seatbelts, smoke detectors in the home, increasing physical activity, increasing p.o. intake of water and vegetables. -labs -Immunizations reviewed.  Patient to consider shingles vaccine local pharmacy -Colonoscopy no longer indicated 2/2 age. Last one done 40/29/2018. -Given handouts -Next CPE in 1 year See After Visit Summary for Counseling Recommendations   Stage 3a chronic kidney disease ([place] -Baseline creatinine 1.20-1.3. -GFR 53.021 05/28/2021 -Renally dose medication -Avoid nephrotoxic medications  - Plan: CMP  Statin intolerance -Caused myalgias - Plan: Lipid panel  Lipoma of back -Stable -When patient ready, referral to plastics for removal given size.  Murmur, cardiac  -New murmur.  Concern recurring dental infections may be contributing -Discussed obtaining labs and new echo -last ECHO 01/28/21 EF 55%, LV regional wall abnormality-abnormal septal motion due to conduction delay, grade 1 diastolic dysfunction, mild calcification of aortic valve -  Plan: CBC with Differential/Platelet  Skin lesion of face -New lesion -Concerning for malignancy given patient's frequent sun exposure. -Discussed wearing sunscreen when outdoors and protective clothing.  - Plan: Ambulatory referral to Dermatology  Weight loss -Weight this visit 161 lbs.  Weight previously 159 on 07/23/2021. -Body mass index is 24.12 kg/m. -Discussed the importance of eating a balanced diet and decreasing intake of snacks/sugary drinks. -Patient is to monitor weight at home. -We will obtain labs -For continued weight loss discussed obtaining imaging.  - Plan: CBC with Differential/Platelet, TSH, T4, Free, Hemoglobin A1c, CMP, PSA  Chronic left shoulder pain  -2/2 OA -Continue tramadol as needed -Advised to mention concerns to Ortho.  Will place new referral if needed for second opinion. - Plan: CBC with  Differential/Platelet  Dental caries  -Discussed the importance of following up with dentist. -Patient advised when given Rx for antibiotics the importance of completing the entire course. -Patient to schedule follow-up dental appointment. - Plan: CBC with Differential/Platelet  Prostate cancer screening  - Plan: PSA  Mixed hyperlipidemia -Total cholesterol 191, HDL 48, LDL 127, triglycerides 70 on 07/19/2020 -Patient with history of statin intolerance-caused myalgias -Lifestyle modifications  - Plan: Lipid panel   Follow-up in 4-6 weeks, sooner if needed  Grier Mitts, MD

## 2022-04-30 ENCOUNTER — Other Ambulatory Visit: Payer: Self-pay | Admitting: Family Medicine

## 2022-04-30 DIAGNOSIS — R972 Elevated prostate specific antigen [PSA]: Secondary | ICD-10-CM

## 2022-04-30 DIAGNOSIS — N1831 Chronic kidney disease, stage 3a: Secondary | ICD-10-CM

## 2022-04-30 DIAGNOSIS — E871 Hypo-osmolality and hyponatremia: Secondary | ICD-10-CM

## 2022-04-30 DIAGNOSIS — R748 Abnormal levels of other serum enzymes: Secondary | ICD-10-CM

## 2022-05-04 ENCOUNTER — Other Ambulatory Visit: Payer: Self-pay | Admitting: Family Medicine

## 2022-05-04 DIAGNOSIS — M545 Low back pain, unspecified: Secondary | ICD-10-CM

## 2022-05-04 DIAGNOSIS — G8929 Other chronic pain: Secondary | ICD-10-CM

## 2022-05-06 ENCOUNTER — Other Ambulatory Visit: Payer: Self-pay | Admitting: Orthopedic Surgery

## 2022-05-06 DIAGNOSIS — M25512 Pain in left shoulder: Secondary | ICD-10-CM | POA: Diagnosis not present

## 2022-05-06 DIAGNOSIS — M25511 Pain in right shoulder: Secondary | ICD-10-CM | POA: Diagnosis not present

## 2022-05-06 DIAGNOSIS — M19112 Post-traumatic osteoarthritis, left shoulder: Secondary | ICD-10-CM | POA: Diagnosis not present

## 2022-05-08 ENCOUNTER — Other Ambulatory Visit: Payer: Self-pay

## 2022-05-08 DIAGNOSIS — E871 Hypo-osmolality and hyponatremia: Secondary | ICD-10-CM

## 2022-05-11 ENCOUNTER — Other Ambulatory Visit: Payer: Medicare Other

## 2022-05-11 ENCOUNTER — Other Ambulatory Visit: Payer: Self-pay | Admitting: Family Medicine

## 2022-05-11 DIAGNOSIS — E871 Hypo-osmolality and hyponatremia: Secondary | ICD-10-CM

## 2022-05-11 DIAGNOSIS — I10 Essential (primary) hypertension: Secondary | ICD-10-CM

## 2022-05-11 LAB — COMPREHENSIVE METABOLIC PANEL
ALT: 12 U/L (ref 0–53)
AST: 24 U/L (ref 0–37)
Albumin: 4.2 g/dL (ref 3.5–5.2)
Alkaline Phosphatase: 117 U/L (ref 39–117)
BUN: 17 mg/dL (ref 6–23)
CO2: 29 mEq/L (ref 19–32)
Calcium: 9.2 mg/dL (ref 8.4–10.5)
Chloride: 93 mEq/L — ABNORMAL LOW (ref 96–112)
Creatinine, Ser: 1.26 mg/dL (ref 0.40–1.50)
GFR: 54.68 mL/min — ABNORMAL LOW (ref >60.00)
Glucose, Bld: 71 mg/dL (ref 70–99)
Potassium: 3.8 mEq/L (ref 3.5–5.1)
Sodium: 128 mEq/L — ABNORMAL LOW (ref 135–145)
Total Bilirubin: 0.5 mg/dL (ref 0.2–1.2)
Total Protein: 7.1 g/dL (ref 6.0–8.3)

## 2022-05-11 MED ORDER — HYDRALAZINE HCL 10 MG PO TABS: 10.0000 mg | ORAL_TABLET | Freq: Two times a day (BID) | ORAL | 3 refills | Status: DC

## 2022-05-21 ENCOUNTER — Telehealth: Payer: Self-pay | Admitting: Family Medicine

## 2022-05-21 NOTE — Telephone Encounter (Signed)
Spoke to patient. See lab note.  

## 2022-05-21 NOTE — Telephone Encounter (Signed)
Pt call and stated he is returning your call about his labs and want a call back on [phone].

## 2022-05-22 ENCOUNTER — Ambulatory Visit
Admission: RE | Admit: 2022-05-22 | Discharge: 2022-05-22 | Disposition: A | Discharge status: Home / Self Care | Payer: Medicare Other | Source: Ambulatory Visit | Attending: Orthopedic Surgery | Admitting: Orthopedic Surgery

## 2022-05-22 ENCOUNTER — Telehealth: Payer: Self-pay

## 2022-05-22 DIAGNOSIS — M25511 Pain in right shoulder: Secondary | ICD-10-CM | POA: Diagnosis not present

## 2022-05-22 NOTE — Telephone Encounter (Signed)
Caller reports falling and hitting his head on cement over the weekend. Reports pain with movement to the right.  05/21/2022 1:10:55 [place], RN, [facility] Advice Given Per Guideline HOME CARE: * You should be able to treat this at home. * Extremity weakness or numbness occurs * Slurred speech or blurred vision occurs * Vomiting occurs * You become worse CALL BACK IF: CARE ADVICE given per Head Injury (Adult) guideline.

## 2022-05-28 ENCOUNTER — Other Ambulatory Visit (HOSPITAL_COMMUNITY): Payer: Medicare Other

## 2022-05-29 ENCOUNTER — Telehealth (HOSPITAL_COMMUNITY): Payer: Self-pay | Admitting: Family Medicine

## 2022-05-29 ENCOUNTER — Ambulatory Visit (HOSPITAL_COMMUNITY): Payer: Medicare Other

## 2022-05-29 NOTE — Telephone Encounter (Signed)
Patient called and cancelled echocardiogram scheduled for 05/29/22. Order will be removed from the active echo WQ. If patient calls to reschedule we will reinstate the order. Thank you.

## 2022-06-12 ENCOUNTER — Ambulatory Visit (HOSPITAL_COMMUNITY): Payer: Medicare Other | Attending: Cardiovascular Disease

## 2022-06-12 DIAGNOSIS — R011 Cardiac murmur, unspecified: Secondary | ICD-10-CM | POA: Diagnosis not present

## 2022-06-13 LAB — ECHOCARDIOGRAM COMPLETE
Area-P 1/2: 2.76 cm2
S' Lateral: 2.65 cm

## 2022-06-17 DIAGNOSIS — M19112 Post-traumatic osteoarthritis, left shoulder: Secondary | ICD-10-CM | POA: Diagnosis not present

## 2022-06-17 DIAGNOSIS — M25511 Pain in right shoulder: Secondary | ICD-10-CM | POA: Diagnosis not present

## 2022-06-23 ENCOUNTER — Ambulatory Visit (HOSPITAL_BASED_OUTPATIENT_CLINIC_OR_DEPARTMENT_OTHER)
Admission: RE | Admit: 2022-06-23 | Discharge: 2022-06-23 | Disposition: A | Discharge status: Home / Self Care | Payer: Medicare Other | Source: Ambulatory Visit | Attending: Family Medicine | Admitting: Family Medicine

## 2022-06-23 DIAGNOSIS — R748 Abnormal levels of other serum enzymes: Secondary | ICD-10-CM | POA: Diagnosis not present

## 2022-06-24 ENCOUNTER — Ambulatory Visit (INDEPENDENT_AMBULATORY_CARE_PROVIDER_SITE_OTHER): Payer: Medicare Other | Admitting: Family Medicine

## 2022-06-24 VITALS — BP 150/68 | HR 65 | Temp 98.5°F | Wt 165.5 lb

## 2022-06-24 DIAGNOSIS — E871 Hypo-osmolality and hyponatremia: Secondary | ICD-10-CM

## 2022-06-24 DIAGNOSIS — I10 Essential (primary) hypertension: Secondary | ICD-10-CM | POA: Diagnosis not present

## 2022-06-24 DIAGNOSIS — B359 Dermatophytosis, unspecified: Secondary | ICD-10-CM | POA: Diagnosis not present

## 2022-06-24 LAB — BASIC METABOLIC PANEL
BUN: 10 mg/dL (ref 6–23)
CO2: 30 mEq/L (ref 19–32)
Calcium: 8.9 mg/dL (ref 8.4–10.5)
Chloride: 97 mEq/L (ref 96–112)
Creatinine, Ser: 1.05 mg/dL (ref 0.40–1.50)
GFR: 67.99 mL/min (ref >60.00)
Glucose, Bld: 81 mg/dL (ref 70–99)
Potassium: 3.8 mEq/L (ref 3.5–5.1)
Sodium: 135 mEq/L (ref 135–145)

## 2022-06-24 LAB — TSH: TSH: 1.5 u[IU]/mL (ref 0.35–5.50)

## 2022-06-24 MED ORDER — MICONAZOLE NITRATE 2 % EX CREA: 1.0000 | TOPICAL_CREAM | Freq: Two times a day (BID) | CUTANEOUS | 0 refills | Status: AC

## 2022-06-24 NOTE — Progress Notes (Signed)
Subjective:    Patient ID: Scott Apo., male    DOB: 05-May-1946, 76 y.o.   MRN: [id number]  Chief Complaint  Patient presents with   Follow-up    Has had 2 falls, fell out the bed, and fell in yard on Friday with weed eater A1c check    HPI Patient was seen today for f/u and recheck of sodium.  Pt states he had 2 falls recently.  Fall last Friday while in the yard.  Lost balance while trying to put up weed eater.  Landed on R hip and hit face on weed eater.  Tried CBD oil.  Feels better with moving around.   Taking Fe2+ nightly.   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  Seen by Ortho, but felt concerns weren't addressed at last OFV. Inquires about having A1C checked.  Has a pruritic rash on forehead.  Past Medical History:  Diagnosis Date   ANEMIA, OTHER UNSPEC 08/02/2009   Arthritis    B12 DEFICIENCY 07/07/2007   Bell's palsy     around 2005   BPPV (benign paroxysmal positional vertigo)    2003   Cataract    EKG abnormalities    GERD 07/13/2007   Gout, unspecified 06/17/2010   HYPERLIPIDEMIA 07/13/2007   HYPERTENSION 07/13/2007   RENAL INSUFFICIENCY 06/04/2010    Allergies  Allergen Reactions   Ferumoxytol Other (See Comments)    Difficulty breathing. Infusion reaction 01/24/21.   Nsaids Other (See Comments)     hx of renal insufficiency   Statins     myalgias    ROS General: Denies fever, chills, night sweats, changes in weight, changes in appetite +falls HEENT: Denies headaches, ear pain, changes in vision, rhinorrhea, sore throat CV: Denies CP, palpitations, SOB, orthopnea Pulm: Denies SOB, cough, wheezing GI: Denies abdominal pain, nausea, vomiting, diarrhea, constipation GU: Denies dysuria, hematuria, frequency Msk: Denies muscle cramps, joint pains +knee pain Neuro: Denies weakness, numbness, tingling Skin: Denies rashes, bruising +rash on forehead Psych: Denies depression, anxiety, hallucinations     Objective:    Blood pressure (!) 142/60, pulse 65,  temperature 98.5 F (36.9 C), temperature source Oral, weight 165 lb 8 oz (75.1 kg), SpO2 99 %.  Gen. Pleasant, well-nourished, in no distress, normal affect   HEENT: [place]/AT, face symmetric, conjunctiva clear, no scleral icterus, PERRLA, EOMI, nares patent without drainage Lungs: no accessory muscle use, CTAB, no wheezes or rales Cardiovascular: RRR, no m/r/g, no peripheral edema Musculoskeletal: No deformities, no cyanosis or clubbing, normal tone Neuro:  A&Ox3, CN II-XII intact, normal gait Skin:  Warm, dry, intact.  Circular lesion with rolled edges on forehead.   Wt Readings from Last 3 Encounters:  06/24/22 165 lb 8 oz (75.1 kg)  04/24/22 161 lb (73 kg)  07/23/21 159 lb 3.2 oz (72.2 kg)    Lab Results  Component Value Date   WBC 4.2 04/24/2022   HGB 11.5 (L) 04/24/2022   HCT 35.1 (L) 04/24/2022   PLT 224.0 04/24/2022   GLUCOSE 71 05/11/2022   CHOL 196 04/24/2022   TRIG 102.0 04/24/2022   HDL 53.80 04/24/2022   LDLDIRECT 131.0 01/24/2016   LDLCALC 122 (H) 04/24/2022   ALT 12 05/11/2022   AST 24 05/11/2022   NA 128 (L) 05/11/2022   K 3.8 05/11/2022   CL 93 (L) 05/11/2022   CREATININE 1.26 05/11/2022   BUN 17 05/11/2022   CO2 29 05/11/2022   TSH 1.39 04/24/2022   PSA 4.36 (H) 04/24/2022   INR  1.0 02/06/2021   HGBA1C 5.9 04/24/2022    Assessment/Plan:  Hyponatremia -sodium 128 on 05/11/22 -discussed possible causes including medications, intake, etc -advised to decrease intake of sodas and tea along with other lifestyle modificaitons  - Plan: Basic metabolic panel  Essential hypertension  -avoid hypotension given increased falls. -continue current meds -lifestyle modifications -Plan: Basic metabolic panel, TSH  Tinea -new problem  - Plan: miconazole (MICATIN) 2 % cream  Pt requesting A1C.  Advised was 5.9% on 04/24/22.   F/u prn  Grier Mitts, MD

## 2022-06-26 DIAGNOSIS — Z96652 Presence of left artificial knee joint: Secondary | ICD-10-CM | POA: Diagnosis not present

## 2022-06-26 DIAGNOSIS — M25551 Pain in right hip: Secondary | ICD-10-CM | POA: Diagnosis not present

## 2022-06-26 DIAGNOSIS — M25562 Pain in left knee: Secondary | ICD-10-CM | POA: Diagnosis not present

## 2022-06-29 ENCOUNTER — Other Ambulatory Visit: Payer: Self-pay | Admitting: Medical

## 2022-06-29 ENCOUNTER — Telehealth: Payer: Self-pay | Admitting: Family Medicine

## 2022-06-29 ENCOUNTER — Ambulatory Visit
Admission: RE | Admit: 2022-06-29 | Discharge: 2022-06-29 | Disposition: A | Discharge status: Home / Self Care | Payer: Medicare Other | Source: Ambulatory Visit | Attending: Medical | Admitting: Medical

## 2022-06-29 DIAGNOSIS — M25551 Pain in right hip: Secondary | ICD-10-CM

## 2022-06-29 DIAGNOSIS — Z96652 Presence of left artificial knee joint: Secondary | ICD-10-CM | POA: Diagnosis not present

## 2022-06-29 DIAGNOSIS — S8002XA Contusion of left knee, initial encounter: Secondary | ICD-10-CM | POA: Diagnosis not present

## 2022-06-29 NOTE — Telephone Encounter (Signed)
Left message for patient to call back and schedule Medicare Annual Wellness Visit (AWV) either virtually or in office. Left  my jabber number [phone]   Last AWV ;06/23/21  please schedule at anytime with [facility] Nurse Health Advisor 1 or 2    

## 2022-07-08 ENCOUNTER — Other Ambulatory Visit: Payer: Self-pay | Admitting: Family Medicine

## 2022-07-08 DIAGNOSIS — I10 Essential (primary) hypertension: Secondary | ICD-10-CM

## 2022-07-11 ENCOUNTER — Encounter: Payer: Self-pay | Admitting: Family Medicine

## 2022-07-16 ENCOUNTER — Ambulatory Visit: Payer: Medicare Other | Admitting: Family Medicine

## 2022-07-20 ENCOUNTER — Other Ambulatory Visit: Payer: Self-pay | Admitting: Family Medicine

## 2022-07-20 DIAGNOSIS — G8929 Other chronic pain: Secondary | ICD-10-CM

## 2022-07-27 ENCOUNTER — Telehealth: Payer: Self-pay | Admitting: Pharmacist

## 2022-07-27 NOTE — Chronic Care Management (AMB) (Signed)
Chronic Care Management Pharmacy Assistant   Name: Scott STOIBER Sr.  MRN: [id number] DOB: Dec 30, 1945  Reason for Encounter: Disease State / Hypertension Assessment Call   Conditions to be addressed/monitored: HTN  Recent office visits:  06/24/2022 Grier Mitts MD - Patient was seen for hyponatremia and additional concerns. Started Miconazole 2%. No follow up noted.   04/24/2022 Grier Mitts MD - Patient was seen for well adult exam and additional issues. No medication changes. Follow up in 2 months.   Recent consult visits:  06/29/2022 Rod Can MD [facility])  - Patient was seen for history of total knee arthroplasty and additional concerns. No additional chart notes.   06/17/2022 Esmond Plants MD [facility]) - Patient was seen for pain of right shoulder joint and rotator cuff tear arthropathy. No additional chart notes.   05/06/2022 Esmond Plants MD [facility]) - Patient was seen for rotator cuff tear arthropathy and bilateral shoulder joint pain. No additional chart notes.   04/20/2022 Rod Can MD [facility])  - Patient was seen for Osteoarthritis of right knee joint and additional issues. No additional chart notes.   Hospital visits:  None  Medications: Outpatient Encounter Medications as of 07/27/2022  Medication Sig   acetaminophen (TYLENOL) 325 MG tablet Take 1-2 tablets (325-650 mg total) by mouth every 6 (six) hours as needed for mild pain (pain score 1-3 or temp > 100.5).   allopurinol (ZYLOPRIM) 300 MG tablet TAKE 1 TABLET BY MOUTH EVERY DAY   celecoxib (CELEBREX) 100 MG capsule TAKE 1 CAPSULE (100 MG TOTAL) BY MOUTH 2 (TWO) TIMES DAILY AS NEEDED FOR MODERATE PAIN (SHOULDER).   colchicine 0.6 MG tablet TAKE 1 TABLET EVERY DAY   Cyanocobalamin 1000 MCG SUBL Place 1 tablet (1,000 mcg total) under the tongue once a week. (Patient taking differently: Place 1,000 mcg under the tongue 2 (two) times a week. Wednesdays & Sundays)   Ferrous Sulfate (IRON PO)  Take 1 tablet by mouth in the morning and at bedtime.   fluticasone (FLONASE) 50 MCG/ACT nasal spray SPRAY 2 SPRAYS INTO EACH NOSTRIL EVERY DAY   hydrALAZINE (APRESOLINE) 10 MG tablet Take 1 tablet (10 mg total) by mouth 2 (two) times daily.   losartan (COZAAR) 50 MG tablet TAKE 1 TABLET BY MOUTH EVERY DAY   miconazole (MICATIN) 2 % cream Apply 1 Application topically 2 (two) times daily.   Polyethylene Glycol 3350 POWD Take 17 g by mouth at bedtime.   sildenafil (VIAGRA) 50 MG tablet Take one half tab (25 mg) daily as needed 1 hour prior to sexual activity.   traMADol (ULTRAM) 50 MG tablet TAKE 2 TABLETS (100 MG TOTAL) BY MOUTH EVERY 12 (TWELVE) HOURS AS NEEDED.   No facility-administered encounter medications on file as of 07/27/2022.  Fill History: LOSARTAN POTASSIUM 50 MG TAB 07/08/2022 90   TRAMADOL HCL 50 MG TABLET 07/22/2022 30   SILDENAFIL 50 MG TABLET 04/24/2020 7   MICONAZOLE 2% TOPICAL CREAM 06/25/2022 14   HYDRALAZINE 10 MG TABLET 05/27/2022 90   FLUTICASONE PROPIONATE  50 MCG/ACT SUSP 01/26/2022 90   COLCHICINE 0.6 MG TABLET 04/06/2022 20   CELECOXIB 100 MG CAPSULE 07/05/2022 30   ALLOPURINOL 300 MG TABLET 05/23/2022 90   Reviewed chart prior to disease state call. Spoke with patient regarding BP  Recent Office Vitals: BP Readings from Last 3 Encounters:  06/24/22 (!) 150/68  04/24/22 128/68  04/15/22 126/68   Pulse Readings from Last 3 Encounters:  06/24/22 65  04/24/22 99  07/23/21  66    Wt Readings from Last 3 Encounters:  06/24/22 165 lb 8 oz (75.1 kg)  04/24/22 161 lb (73 kg)  07/23/21 159 lb 3.2 oz (72.2 kg)     Kidney Function Lab Results  Component Value Date/Time   CREATININE 1.05 06/24/2022 11:38 AM   CREATININE 1.26 05/11/2022 11:14 AM   CREATININE 1.83 (H) 05/30/2021 04:39 PM   CREATININE 1.20 01/13/2021 11:16 AM   CREATININE 1.66 (H) 07/19/2020 10:28 AM   GFR 67.99 06/24/2022 11:38 AM   GFRNONAA >60 02/14/2021 02:54 AM   GFRNONAA >60  01/13/2021 11:16 AM   GFRNONAA 39 (L) 07/19/2020 10:28 AM   GFRAA 46 (L) 07/19/2020 10:28 AM       Latest Ref Rng & Units 06/24/2022   11:38 AM 05/11/2022   11:14 AM 04/24/2022   10:53 AM  BMP  Glucose 70 - 99 mg/dL 81  71  90   BUN 6 - 23 mg/dL '10  17  13   '$ Creatinine 0.40 - 1.50 mg/dL 1.05  1.26  1.19   Sodium 135 - 145 mEq/L 135  128  129   Potassium 3.5 - 5.1 mEq/L 3.8  3.8  4.3   Chloride 96 - 112 mEq/L 97  93  92   CO2 19 - 32 mEq/L '30  29  31   '$ Calcium 8.4 - 10.5 mg/dL 8.9  9.2  9.5     Current antihypertensive regimen:  Hydralazine 10 mg twice daily Losartan 50 mg daily  How often are you checking your Blood Pressure?   Current home BP readings:   What recent interventions/DTPs have been made by any provider to improve Blood Pressure control since last CPP Visit: No recent interventions.  Any recent hospitalizations or ED visits since last visit with CPP? No recent hospital visits.   What diet changes have been made to improve Blood Pressure Control?  Patient follows  Breakfast - patient Lunch - patient Dinner - patient  What exercise is being done to improve your Blood Pressure Control?    Adherence Review: Is the patient currently on ACE/ARB medication? Yes Does the patient have >5 day gap between last estimated fill dates? No  Unable to reach patient after several attempts  Care Gaps: AWV - scheduled 08/03/2022 Last BP - 150/68 on 06/24/2022 Shingrix - never done Tdap - overdue Covid booster - overdue Flu - due  Star Rating Drugs: Losartan 50 mg - last filled 07/08/2022 90 DS at [place] Pharmacist Assistant [phone]

## 2022-08-03 ENCOUNTER — Ambulatory Visit (INDEPENDENT_AMBULATORY_CARE_PROVIDER_SITE_OTHER): Payer: Medicare Other

## 2022-08-03 ENCOUNTER — Ambulatory Visit (INDEPENDENT_AMBULATORY_CARE_PROVIDER_SITE_OTHER): Payer: Medicare Other | Admitting: Family Medicine

## 2022-08-03 VITALS — BP 120/62 | HR 60 | Temp 98.3°F | Wt 162.8 lb

## 2022-08-03 VITALS — BP 120/62 | HR 60 | Temp 98.3°F | Ht 68.8 in | Wt 162.5 lb

## 2022-08-03 DIAGNOSIS — L989 Disorder of the skin and subcutaneous tissue, unspecified: Secondary | ICD-10-CM

## 2022-08-03 DIAGNOSIS — I10 Essential (primary) hypertension: Secondary | ICD-10-CM

## 2022-08-03 DIAGNOSIS — R972 Elevated prostate specific antigen [PSA]: Secondary | ICD-10-CM | POA: Diagnosis not present

## 2022-08-03 DIAGNOSIS — Z0000 Encounter for general adult medical examination without abnormal findings: Secondary | ICD-10-CM

## 2022-08-03 NOTE — Patient Instructions (Addendum)
Scott [name] , Thank you for taking time to come for your Medicare Wellness Visit. I appreciate your ongoing commitment to your health goals. Please review the following plan we discussed and let me know if I can assist you in the future.   These are the goals we discussed:  Goals       No curent goals (pt-stated)      Patient Stated      I would like to walk more         This is a list of the screening recommended for you and due dates:  Health Maintenance  Topic Date Due   COVID-19 Vaccine (4 - Pfizer risk series) 08/19/2022*   Zoster (Shingles) Vaccine (1 of 2) 11/03/2022*   Flu Shot  01/31/2023*   Tetanus Vaccine  08/04/2023*   Pneumonia Vaccine  Completed   Hepatitis C Screening: USPSTF Recommendation to screen - Ages 18-79 yo.  Completed   HPV Vaccine  Aged Out   Colon Cancer Screening  Discontinued  *Topic was postponed. The date shown is not the original due date.    Opioid Pain Medicine Management Opioids are powerful medicines that are used to treat moderate to severe pain. When used for short periods of time, they can help you to: Sleep better. Do better in physical or occupational therapy. Feel better in the first few days after an injury. Recover from surgery. Opioids should be taken with the supervision of a trained health care provider. They should be taken for the shortest period of time possible. This is because opioids can be addictive, and the longer you take opioids, the greater your risk of addiction. This addiction can also be called opioid use disorder. What are the risks? Using opioid pain medicines for longer than 3 days increases your risk of side effects. Side effects include: Constipation. Nausea and vomiting. Breathing difficulties (respiratory depression). Drowsiness. Confusion. Opioid use disorder. Itching. Taking opioid pain medicine for a long period of time can affect your ability to do daily tasks. It also puts you at risk for: Motor  vehicle crashes. Depression. Suicide. Heart attack. Overdose, which can be life-threatening. What is a pain treatment plan? A pain treatment plan is an agreement between you and your health care provider. Pain is unique to each person, and treatments vary depending on your condition. To manage your pain, you and your health care provider need to work together. To help you do this: Discuss the goals of your treatment, including how much pain you might expect to have and how you will manage the pain. Review the risks and benefits of taking opioid medicines. Remember that a good treatment plan uses more than one approach and minimizes the chance of side effects. Be honest about the amount of medicines you take and about any drug or alcohol use. Get pain medicine prescriptions from only one health care provider. Pain can be managed with many types of alternative treatments. Ask your health care provider to refer you to one or more specialists who can help you manage pain through: Physical or occupational therapy. Counseling (cognitive behavioral therapy). Good nutrition. Biofeedback. Massage. Meditation. Non-opioid medicine. Following a gentle exercise program. How to use opioid pain medicine Taking medicine Take your pain medicine exactly as told by your health care provider. Take it only when you need it. If your pain gets less severe, you may take less than your prescribed dose if your health care provider approves. If you are not having pain, do nottake  pain medicine unless your health care provider tells you to take it. If your pain is severe, do nottry to treat it yourself by taking more pills than instructed on your prescription. Contact your health care provider for help. Write down the times when you take your pain medicine. It is easy to become confused while on pain medicine. Writing the time can help you avoid overdose. Take other over-the-counter or prescription medicines only as  told by your health care provider. Keeping yourself and others safe  While you are taking opioid pain medicine: Do not drive, use machinery, or power tools. Do not sign legal documents. Do not drink alcohol. Do not take sleeping pills. Do not supervise children by yourself. Do not do activities that require climbing or being in high places. Do not go to a lake, river, ocean, spa, or swimming pool. Do not share your pain medicine with anyone. Keep pain medicine in a locked cabinet or in a secure area where pets and children cannot reach it. Stopping your use of opioids If you have been taking opioid medicine for more than a few weeks, you may need to slowly decrease (taper) how much you take until you stop completely. Tapering your use of opioids can decrease your risk of symptoms of withdrawal, such as: Pain and cramping in the abdomen. Nausea. Sweating. Sleepiness. Restlessness. Uncontrollable shaking (tremors). Cravings for the medicine. Do not attempt to taper your use of opioids on your own. Talk with your health care provider about how to do this. Your health care provider may prescribe a step-down schedule based on how much medicine you are taking and how long you have been taking it. Getting rid of leftover pills Do not save any leftover pills. Get rid of leftover pills safely by: Taking the medicine to a prescription take-back program. This is usually offered by the county or law enforcement. Bringing them to a pharmacy that has a drug disposal container. Flushing them down the toilet. Check the label or package insert of your medicine to see whether this is safe to do. Throwing them out in the trash. Check the label or package insert of your medicine to see whether this is safe to do. If it is safe to throw it out, remove the medicine from the original container, put it into a sealable bag or container, and mix it with used coffee grounds, food scraps, dirt, or cat litter before  putting it in the trash. Follow these instructions at home: Activity Do exercises as told by your health care provider. Avoid activities that make your pain worse. Return to your normal activities as told by your health care provider. Ask your health care provider what activities are safe for you. General instructions You may need to take these actions to prevent or treat constipation: Drink enough fluid to keep your urine pale yellow. Take over-the-counter or prescription medicines. Eat foods that are high in fiber, such as beans, whole grains, and fresh fruits and vegetables. Limit foods that are high in fat and processed sugars, such as fried or sweet foods. Keep all follow-up visits. This is important. Where to find support If you have been taking opioids for a long time, you may benefit from receiving support for quitting from a local support group or counselor. Ask your health care provider for a referral to these resources in your area. Where to find more information Centers for Disease Control and Prevention (CDC): http://www.wolf.info/ [place] Food and Drug Administration (FDA): [employer] Get help  right away if: You may have taken too much of an opioid (overdosed). Common symptoms of an overdose: Your breathing is slower or more shallow than normal. You have a very slow heartbeat (pulse). You have slurred speech. You have nausea and vomiting. Your pupils become very small. You have other potential symptoms: You are very confused. You faint or feel like you will faint. You have cold, clammy skin. You have blue lips or fingernails. You have thoughts of harming yourself or harming others. These symptoms may represent a serious problem that is an emergency. Do not wait to see if the symptoms will go away. Get medical help right away. Call your local emergency services (911 in the [place] Do not drive yourself to the hospital.  If you ever feel like you may hurt yourself or others, or have  thoughts about taking your own life, get help right away. Go to your nearest emergency department or: Call your local emergency services (911 in the [place] Call the [facility] [phone] in the [place] Call a suicide crisis helpline, such as the [place] at 505-021-9927 or 988 in the [place]. This is open 24 hours a day in the [place] Text the Crisis Text Line at 5745856156 (in the [place].). Summary Opioid medicines can help you manage moderate to severe pain for a short period of time. A pain treatment plan is an agreement between you and your health care provider. Discuss the goals of your treatment, including how much pain you might expect to have and how you will manage the pain. If you think that you or someone else may have taken too much of an opioid, get medical help right away. This information is not intended to replace advice given to you by your health care provider. Make sure you discuss any questions you have with your health care provider. Document Revised: 05/14/2021 Document Reviewed: 01/29/2021 Elsevier Patient Education  [place] directives: Advance directive discussed with you today. Even though you declined this today, please call our office should you change your mind, and we can give you the proper paperwork for you to fill out.   Conditions/risks identified: None  Next appointment: Follow up in one year for your annual wellness visit     Preventive Care 65 Years and Older, Male Preventive care refers to lifestyle choices and visits with your health care provider that can promote health and wellness. What does preventive care include? A yearly physical exam. This is also called an annual well check. Dental exams once or twice a year. Routine eye exams. Ask your health care provider how often you should have your eyes checked. Personal lifestyle choices, including: Daily care of your teeth and  gums. Regular physical activity. Eating a healthy diet. Avoiding tobacco and drug use. Limiting alcohol use. Practicing safe sex. Taking low-dose aspirin every day. Taking vitamin and mineral supplements as recommended by your health care provider. What happens during an annual well check? The services and screenings done by your health care provider during your annual well check will depend on your age, overall health, lifestyle risk factors, and family history of disease. Counseling  Your health care provider may ask you questions about your: Alcohol use. Tobacco use. Drug use. Emotional well-being. Home and relationship well-being. Sexual activity. Eating habits. History of falls. Memory and ability to understand (cognition). Work and work Statistician. Reproductive health. Screening  You may have the following tests or measurements: Height, weight, and  BMI. Blood pressure. Lipid and cholesterol levels. These may be checked every 5 years, or more frequently if you are over 34 years old. Skin check. Lung cancer screening. You may have this screening every year starting at age 53 if you have a 30-pack-year history of smoking and currently smoke or have quit within the past 15 years. Fecal occult blood test (FOBT) of the stool. You may have this test every year starting at age 79. Flexible sigmoidoscopy or colonoscopy. You may have a sigmoidoscopy every 5 years or a colonoscopy every 10 years starting at age 66. Hepatitis C blood test. Hepatitis B blood test. Sexually transmitted disease (STD) testing. Diabetes screening. This is done by checking your blood sugar (glucose) after you have not eaten for a while (fasting). You may have this done every 1-3 years. Bone density scan. This is done to screen for osteoporosis. You may have this done starting at age 97. Mammogram. This may be done every 1-2 years. Talk to your health care provider about how often you should have regular  mammograms. Talk with your health care provider about your test results, treatment options, and if necessary, the need for more tests. Vaccines  Your health care provider may recommend certain vaccines, such as: Influenza vaccine. This is recommended every year. Tetanus, diphtheria, and acellular pertussis (Tdap, Td) vaccine. You may need a Td booster every 10 years. Zoster vaccine. You may need this after age 80. Pneumococcal 13-valent conjugate (PCV13) vaccine. One dose is recommended after age 30. Pneumococcal polysaccharide (PPSV23) vaccine. One dose is recommended after age 6. Talk to your health care provider about which screenings and vaccines you need and how often you need them. This information is not intended to replace advice given to you by your health care provider. Make sure you discuss any questions you have with your health care provider. Document Released: 11/15/2015 Document Revised: 07/08/2016 Document Reviewed: 08/20/2015 Elsevier Interactive Patient Education  2017 [place] Prevention in the Home Falls can cause injuries. They can happen to people of all ages. There are many things you can do to make your home safe and to help prevent falls. What can I do on the outside of my home? Regularly fix the edges of walkways and driveways and fix any cracks. Remove anything that might make you trip as you walk through a door, such as a raised step or threshold. Trim any bushes or trees on the path to your home. Use bright outdoor lighting. Clear any walking paths of anything that might make someone trip, such as rocks or tools. Regularly check to see if handrails are loose or broken. Make sure that both sides of any steps have handrails. Any raised decks and porches should have guardrails on the edges. Have any leaves, snow, or ice cleared regularly. Use sand or salt on walking paths during winter. Clean up any spills in your garage right away. This includes oil  or grease spills. What can I do in the bathroom? Use night lights. Install grab bars by the toilet and in the tub and shower. Do not use towel bars as grab bars. Use non-skid mats or decals in the tub or shower. If you need to sit down in the shower, use a plastic, non-slip stool. Keep the floor dry. Clean up any water that spills on the floor as soon as it happens. Remove soap buildup in the tub or shower regularly. Attach bath mats securely with double-sided non-slip rug tape. Do not  have throw rugs and other things on the floor that can make you trip. What can I do in the bedroom? Use night lights. Make sure that you have a light by your bed that is easy to reach. Do not use any sheets or blankets that are too big for your bed. They should not hang down onto the floor. Have a firm chair that has side arms. You can use this for support while you get dressed. Do not have throw rugs and other things on the floor that can make you trip. What can I do in the kitchen? Clean up any spills right away. Avoid walking on wet floors. Keep items that you use a lot in easy-to-reach places. If you need to reach something above you, use a strong step stool that has a grab bar. Keep electrical cords out of the way. Do not use floor polish or wax that makes floors slippery. If you must use wax, use non-skid floor wax. Do not have throw rugs and other things on the floor that can make you trip. What can I do with my stairs? Do not leave any items on the stairs. Make sure that there are handrails on both sides of the stairs and use them. Fix handrails that are broken or loose. Make sure that handrails are as long as the stairways. Check any carpeting to make sure that it is firmly attached to the stairs. Fix any carpet that is loose or worn. Avoid having throw rugs at the top or bottom of the stairs. If you do have throw rugs, attach them to the floor with carpet tape. Make sure that you have a light  switch at the top of the stairs and the bottom of the stairs. If you do not have them, ask someone to add them for you. What else can I do to help prevent falls? Wear shoes that: Do not have high heels. Have rubber bottoms. Are comfortable and fit you well. Are closed at the toe. Do not wear sandals. If you use a stepladder: Make sure that it is fully opened. Do not climb a closed stepladder. Make sure that both sides of the stepladder are locked into place. Ask someone to hold it for you, if possible. Clearly mark and make sure that you can see: Any grab bars or handrails. First and last steps. Where the edge of each step is. Use tools that help you move around (mobility aids) if they are needed. These include: Canes. Walkers. Scooters. Crutches. Turn on the lights when you go into a dark area. Replace any light bulbs as soon as they burn out. Set up your furniture so you have a clear path. Avoid moving your furniture around. If any of your floors are uneven, fix them. If there are any pets around you, be aware of where they are. Review your medicines with your doctor. Some medicines can make you feel dizzy. This can increase your chance of falling. Ask your doctor what other things that you can do to help prevent falls. This information is not intended to replace advice given to you by your health care provider. Make sure you discuss any questions you have with your health care provider. Document Released: 08/15/2009 Document Revised: 03/26/2016 Document Reviewed: 11/23/2014 Elsevier Interactive Patient Education  2017 [employer].

## 2022-08-03 NOTE — Progress Notes (Signed)
Subjective:   Scott MILHOUSE Sr. is a 76 y.o. male who presents for Medicare Annual/Subsequent preventive examination.  Review of Systems     Cardiac Risk Factors include: advanced age (>59mn, >>87women);hypertension;male gender     Objective:    Today's Vitals   08/03/22 1022  BP: 120/62  Pulse: 60  Temp: 98.3 F (36.8 C)  TempSrc: Oral  SpO2: 98%  Weight: 162 lb 8 oz (73.7 kg)  Height: 5' 8.8" (1.748 m)   Body mass index is 24.14 kg/m.     08/03/2022   10:34 AM 06/23/2021   10:45 AM 02/13/2021    7:49 PM 02/06/2021    8:05 AM 01/24/2021    3:10 PM 12/09/2020    9:26 AM 06/24/2020    3:39 PM  Advanced Directives  Does Patient Have a Medical Advance Directive? No No No No No No No  Would patient like information on creating a medical advance directive? No - Patient declined No - Patient declined No - Patient declined  No - Patient declined  No - Patient declined    Current Medications (verified) Outpatient Encounter Medications as of 08/03/2022  Medication Sig   acetaminophen (TYLENOL) 325 MG tablet Take 1-2 tablets (325-650 mg total) by mouth every 6 (six) hours as needed for mild pain (pain score 1-3 or temp > 100.5).   allopurinol (ZYLOPRIM) 300 MG tablet TAKE 1 TABLET BY MOUTH EVERY DAY   celecoxib (CELEBREX) 100 MG capsule TAKE 1 CAPSULE (100 MG TOTAL) BY MOUTH 2 (TWO) TIMES DAILY AS NEEDED FOR MODERATE PAIN (SHOULDER).   colchicine 0.6 MG tablet TAKE 1 TABLET EVERY DAY   Cyanocobalamin 1000 MCG SUBL Place 1 tablet (1,000 mcg total) under the tongue once a week. (Patient taking differently: Place 1,000 mcg under the tongue 2 (two) times a week. Wednesdays & Sundays)   Ferrous Sulfate (IRON PO) Take 1 tablet by mouth in the morning and at bedtime.   fluticasone (FLONASE) 50 MCG/ACT nasal spray SPRAY 2 SPRAYS INTO EACH NOSTRIL EVERY DAY   hydrALAZINE (APRESOLINE) 10 MG tablet Take 1 tablet (10 mg total) by mouth 2 (two) times daily.   losartan (COZAAR) 50 MG tablet  TAKE 1 TABLET BY MOUTH EVERY DAY   miconazole (MICATIN) 2 % cream Apply 1 Application topically 2 (two) times daily.   Polyethylene Glycol 3350 POWD Take 17 g by mouth at bedtime.   sildenafil (VIAGRA) 50 MG tablet Take one half tab (25 mg) daily as needed 1 hour prior to sexual activity.   traMADol (ULTRAM) 50 MG tablet TAKE 2 TABLETS (100 MG TOTAL) BY MOUTH EVERY 12 (TWELVE) HOURS AS NEEDED.   No facility-administered encounter medications on file as of 08/03/2022.    Allergies (verified) Ferumoxytol, Nsaids, and Statins   History: Past Medical History:  Diagnosis Date   ANEMIA, OTHER UNSPEC 08/02/2009   Arthritis    B12 DEFICIENCY 07/07/2007   Bell's palsy     around 2005   BPPV (benign paroxysmal positional vertigo)    2003   Cataract    EKG abnormalities    GERD 07/13/2007   Gout, unspecified 06/17/2010   HYPERLIPIDEMIA 07/13/2007   HYPERTENSION 07/13/2007   RENAL INSUFFICIENCY 06/04/2010   Past Surgical History:  Procedure Laterality Date   BACK SURGERY  07/13/07   CARPAL TUNNEL RELEASE Bilateral    CATARACT EXTRACTION, BILATERAL Bilateral    KNEE ARTHROPLASTY Left 02/13/2021   Procedure: COMPUTER ASSISTED TOTAL KNEE ARTHROPLASTY;  Surgeon: SRod Can MD;  Location: [facility];  Service: Orthopedics;  Laterality: Left;   Family History  Problem Relation Age of Onset   Arthritis Father    Social History   Socioeconomic History   Marital status: Married    Spouse name: [place]ola   Number of children: 4   Years of education: Not on file   Highest education level: Some college, no degree  Occupational History    Employer: RETIRED  Tobacco Use   Smoking status: Never   Smokeless tobacco: Never  Vaping Use   Vaping Use: Never used  Substance and Sexual Activity   Alcohol use: No   Drug use: No   Sexual activity: Not on file  Other Topics Concern   Not on file  Social History Narrative   Married 1965. 4 children. 11 grandchildren. 1 greatgrandchild on way in 2015.        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 He is married, lives with his wife. He states he stops every morning and gets a 52oz tea. He walks most days.   Social Determinants of Health   Financial Resource Strain: Low Risk  (08/03/2022)   Overall Financial Resource Strain (CARDIA)    Difficulty of Paying Living Expenses: Not hard at all  Food Insecurity: No Food Insecurity (08/03/2022)   Hunger Vital Sign    Worried About Running Out of Food in the Last Year: Never true    Ran Out of Food in the Last Year: Never true  Transportation Needs: No Transportation Needs (08/03/2022)   PRAPARE - Hydrologist (Medical): No    Lack of Transportation (Non-Medical): No  Physical Activity: Inactive (08/03/2022)   Exercise Vital Sign    Days of Exercise per Week: 0 days    Minutes of Exercise per Session: 0 min  Stress: No Stress Concern Present (08/03/2022)   [place]    Feeling of Stress : Not at all  Social Connections: Moderately Isolated (08/03/2022)   Social Connection and Isolation Panel [NHANES]    Frequency of Communication with Friends and Family: More than three times a week    Frequency of Social Gatherings with Friends and Family: More than three times a week    Attends Religious Services: Never    Marine scientist or Organizations: No    Attends Music therapist: Never    Marital Status: Married    Tobacco Counseling Counseling given: Not Answered   Clinical Intake:  Pre-visit preparation completed: NoHow often do you need to have someone help you when you read instructions, pamphlets, or other written materials from your doctor or pharmacy?: 1 - Never  Diabetic?  No  Interpreter Needed?: No  Information entered by :: Rolene Arbour LPN   Activities of Daily Living    08/03/2022   10:31 AM  In your present  state of health, do you have any difficulty performing the following activities:  Hearing? 0  Vision? 0  Difficulty concentrating or making decisions? 0  Walking or climbing stairs? 0  Dressing or bathing? 0  Doing errands, shopping? 0  Preparing Food and eating ? N  Using the Toilet? N  In the past six months, have you accidently leaked urine? N  Do you have problems with loss of bowel control? N  Managing your Medications? N  Managing your Finances? N  Housekeeping  or managing your Housekeeping? N    Patient Care Team: [name], MD as PCP - General (Family Medicine) [name], [facility] as Pharmacist (Pharmacist)  Indicate any recent Medical Services you may have received from other than [facility] providers in the past year (date may be approximate).     Assessment:   This is a routine wellness examination for Ludwig.  Hearing/Vision screen Hearing Screening - Comments:: Denies hearing difficulties   Vision Screening - Comments:: Wears rx glasses - up to date with routine eye exams with  Dr Schuyler Amor  Dietary issues and exercise activities discussed: Current Exercise Habits: The patient does not participate in regular exercise at present, Exercise limited by: None identified   Goals Addressed               This Visit's Progress     No curent goals (pt-stated)         Depression Screen    08/03/2022   10:30 AM 06/24/2022   10:43 AM 04/24/2022   10:17 AM 06/23/2021   10:47 AM 07/11/2020   10:48 AM 06/24/2020    3:42 PM 03/30/2017   10:04 AM  PHQ 2/9 Scores  PHQ - 2 Score 0 0 0 0 0 0 0  PHQ- 9 Score 0 0 0  1 0     Fall Risk    08/03/2022   10:32 AM 06/24/2022   10:43 AM 04/24/2022   10:16 AM 06/23/2021   10:45 AM 07/11/2020   10:50 AM  Fall Risk   Falls in the past year? 1 1 0 0 0  Number falls in past yr: 0 1 0 0   Injury with Fall? 0 0 0 0   Risk for fall due to : Other (Comment) Other (Comment) No Fall Risks Impaired balance/gait   Risk for fall due to:  Comment Patient states Loss Balance. No injury or medical attention needed      Follow up Falls prevention discussed Falls evaluation completed Falls evaluation completed Falls evaluation completed     [place]:  Any stairs in or around the home? No If so, are there any without handrails? No  Home free of loose throw rugs in walkways, pet beds, electrical cords, etc? Yes  Adequate lighting in your home to reduce risk of falls? Yes   ASSISTIVE DEVICES UTILIZED TO PREVENT FALLS:  Life alert? No  Use of a cane, walker or w/c? No  Grab bars in the bathroom? Yes  Shower chair or bench in shower? Yes  Elevated toilet seat or a handicapped toilet? Yes   TIMED UP AND GO:  Was the test performed? Yes .  Length of time to ambulate 10 feet: 10 sec.   Gait slow and steady with assistive device  Cognitive Function:        08/03/2022   10:35 AM 06/24/2020    3:45 PM  6CIT Screen  What Year? 0 points 0 points  What month? 0 points 0 points  What time? 0 points 0 points  Count back from 20 0 points 0 points  Months in reverse 0 points 0 points  Repeat phrase 0 points 4 points  Total Score 0 points 4 points    Immunizations Immunization History  Administered Date(s) Administered   PFIZER(Purple Top)SARS-COV-2 Vaccination 12/02/2019, 12/30/2019, 08/17/2020   Pneumococcal Conjugate-13 01/30/2014   Pneumococcal Polysaccharide-23 06/16/2013   Td 01/18/2009    TDAP status: Due, Education has been provided regarding  the importance of this vaccine. Advised may receive this vaccine at local pharmacy or Health Dept. Aware to provide a copy of the vaccination record if obtained from local pharmacy or Health Dept. Verbalized acceptance and understanding.  Flu Vaccine status: Declined, Education has been provided regarding the importance of this vaccine but patient still declined. Advised may receive this vaccine at local pharmacy or Health Dept. Aware to  provide a copy of the vaccination record if obtained from local pharmacy or Health Dept. Verbalized acceptance and understanding.  Pneumococcal vaccine status: Up to date  Covid-19 vaccine status: Completed vaccines  Qualifies for Shingles Vaccine? Yes   Zostavax completed No   Shingrix Completed?: No.    Education has been provided regarding the importance of this vaccine. Patient has been advised to call insurance company to determine out of pocket expense if they have not yet received this vaccine. Advised may also receive vaccine at local pharmacy or Health Dept. Verbalized acceptance and understanding.  Screening Tests Health Maintenance  Topic Date Due   COVID-19 Vaccine (4 - Pfizer risk series) 08/19/2022 (Originally 10/12/2020)   Zoster Vaccines- Shingrix (1 of 2) 11/03/2022 (Originally [birth year]/1962)   INFLUENZA VACCINE  01/31/2023 (Originally 06/02/2022)   TETANUS/TDAP  08/04/2023 (Originally 01/19/2019)   Pneumonia Vaccine 58+ Years old  Completed   Hepatitis C Screening  Completed   HPV VACCINES  Aged Out   COLONOSCOPY (Pts 45-54yr Insurance coverage will need to be confirmed)  Discontinued    Health Maintenance  There are no preventive care reminders to display for this patient.   Colorectal cancer screening: No longer required.   Lung Cancer Screening: (Low Dose CT Chest recommended if Age 425-80years, 30 pack-year currently smoking OR have quit w/in 15years.) does not qualify.     Additional Screening:  Hepatitis C Screening: does qualify; Completed 01/24/16  Vision Screening: Recommended annual ophthalmology exams for early detection of glaucoma and other disorders of the eye. Is the patient up to date with their annual eye exam?  Yes  Who is the provider or what is the name of the office in which the patient attends annual eye exams? Dr GSchuyler AmorIf pt is not established with a provider, would they like to be referred to a provider to establish care? No .   Dental  Screening: Recommended annual dental exams for proper oral hygiene  Community Resource Referral / Chronic Care Management:  CRR required this visit?  No   CCM required this visit?  No      Plan:     I have personally reviewed and noted the following in the patient's chart:   Medical and social history Use of alcohol, tobacco or illicit drugs  Current medications and supplements including opioid prescriptions. Patient currently taking opioids Functional ability and status Nutritional status Physical activity Advanced directives List of other physicians Hospitalizations, surgeries, and ER visits in previous 12 months Vitals Screenings to include cognitive, depression, and falls Referrals and appointments  In addition, I have reviewed and discussed with patient certain preventive protocols, quality metrics, and best practice recommendations. A written personalized care plan for preventive services as well as general preventive health recommendations were provided to patient.     BCriselda Peaches LPN   [id number]  Nurse Notes: None

## 2022-08-03 NOTE — Progress Notes (Signed)
Subjective:    Patient ID: Scott [name]., male    DOB: 1945/11/04, 76 y.o.   MRN: [id number]  Chief Complaint  Patient presents with   Follow-up    HPI Patient was seen today for f/u.  Pt states he has been doing well.  Cream given last OFV helped with dry spot on forehead.  Pt has yet to hear back about referral for Dermatology to remove bump on nose.  Per chart review referral sent to Urology 04/30/22 for elevated PSA, but no appt made.  Pt was taking iron supplement, but ran out of laxative.  Stable on current bp meds.  No falls since last OFV.  Past Medical History:  Diagnosis Date   ANEMIA, OTHER UNSPEC 08/02/2009   Arthritis    B12 DEFICIENCY 07/07/2007   Bell's palsy     around 2005   BPPV (benign paroxysmal positional vertigo)    2003   Cataract    EKG abnormalities    GERD 07/13/2007   Gout, unspecified 06/17/2010   HYPERLIPIDEMIA 07/13/2007   HYPERTENSION 07/13/2007   RENAL INSUFFICIENCY 06/04/2010    Allergies  Allergen Reactions   Ferumoxytol Other (See Comments)    Difficulty breathing. Infusion reaction 01/24/21.   Nsaids Other (See Comments)     hx of renal insufficiency   Statins     myalgias    ROS General: Denies fever, chills, night sweats, changes in weight, changes in appetite HEENT: Denies headaches, ear pain, changes in vision, rhinorrhea, sore throat CV: Denies CP, palpitations, SOB, orthopnea Pulm: Denies SOB, cough, wheezing GI: Denies abdominal pain, nausea, vomiting, diarrhea, constipation GU: Denies dysuria, hematuria, frequency Msk: Denies muscle cramps, joint pains Neuro: Denies weakness, numbness, tingling Skin: Denies rashes, bruising  +skin lesion on nose Psych: Denies depression, anxiety, hallucinations      Objective:    Blood pressure 120/62, pulse 60, temperature 98.3 F (36.8 C), temperature source Oral, weight 162 lb 12.8 oz (73.8 kg), SpO2 98 %.   Gen. Pleasant, well-nourished, in no distress, normal affect   HEENT: [place]/AT,  face symmetric, conjunctiva clear, no scleral icterus, PERRLA, EOMI, nares patent without drainage, pharynx without erythema or exudate. Lungs: no accessory muscle use, CTAB, no wheezes or rales Cardiovascular: RRR, no m/r/g, no peripheral edema Musculoskeletal: No deformities, no cyanosis or clubbing, normal tone Neuro:  A&Ox3, CN II-XII intact, normal gait Skin:  Warm, no lesions/ rash.  Papule on nose.   Wt Readings from Last 3 Encounters:  08/03/22 162 lb 12.8 oz (73.8 kg)  08/03/22 162 lb 8 oz (73.7 kg)  06/24/22 165 lb 8 oz (75.1 kg)    Lab Results  Component Value Date   WBC 4.2 04/24/2022   HGB 11.5 (L) 04/24/2022   HCT 35.1 (L) 04/24/2022   PLT 224.0 04/24/2022   GLUCOSE 81 06/24/2022   CHOL 196 04/24/2022   TRIG 102.0 04/24/2022   HDL 53.80 04/24/2022   LDLDIRECT 131.0 01/24/2016   LDLCALC 122 (H) 04/24/2022   ALT 12 05/11/2022   AST 24 05/11/2022   NA 135 06/24/2022   K 3.8 06/24/2022   CL 97 06/24/2022   CREATININE 1.05 06/24/2022   BUN 10 06/24/2022   CO2 30 06/24/2022   TSH 1.50 06/24/2022   PSA 4.36 (H) 04/24/2022   INR 1.0 02/06/2021   HGBA1C 5.9 04/24/2022    Assessment/Plan:  Essential hypertension -controlled -continue current meds -continue lifestyle modifications.  Skin lesion of face -papule of nose concern for possible malignancy. -pt  wishes to proceed with removal, however has not been contacted by [facility]. -will check with referral coordinator/derm office regarding scheduling appt. -given strict precautions  Elevated PSA -PSA 4.36 in June 2023.   -referral to urology placed 04/30/22, however still awaiting scheduling info. -will check in w/ referral coordinator/urology office regarding appt. -given strict precautions  F/u prn in the next 3-4 months, sooner if needed.  Grier Mitts, MD

## 2022-08-12 ENCOUNTER — Other Ambulatory Visit: Payer: Self-pay | Admitting: Family Medicine

## 2022-08-12 DIAGNOSIS — I10 Essential (primary) hypertension: Secondary | ICD-10-CM

## 2022-08-21 ENCOUNTER — Other Ambulatory Visit: Payer: Self-pay | Admitting: Family Medicine

## 2022-08-23 ENCOUNTER — Other Ambulatory Visit: Payer: Self-pay | Admitting: Family Medicine

## 2022-08-23 ENCOUNTER — Encounter: Payer: Self-pay | Admitting: Family Medicine

## 2022-08-23 DIAGNOSIS — I10 Essential (primary) hypertension: Secondary | ICD-10-CM

## 2022-09-13 ENCOUNTER — Other Ambulatory Visit: Payer: Self-pay | Admitting: Family Medicine

## 2022-09-30 ENCOUNTER — Ambulatory Visit: Payer: Medicare Other | Admitting: Family Medicine

## 2022-10-01 ENCOUNTER — Telehealth: Payer: Self-pay | Admitting: Pharmacist

## 2022-10-01 DIAGNOSIS — R809 Proteinuria, unspecified: Secondary | ICD-10-CM | POA: Diagnosis not present

## 2022-10-01 DIAGNOSIS — N2581 Secondary hyperparathyroidism of renal origin: Secondary | ICD-10-CM | POA: Diagnosis not present

## 2022-10-01 DIAGNOSIS — E871 Hypo-osmolality and hyponatremia: Secondary | ICD-10-CM | POA: Diagnosis not present

## 2022-10-01 DIAGNOSIS — N189 Chronic kidney disease, unspecified: Secondary | ICD-10-CM | POA: Diagnosis not present

## 2022-10-01 DIAGNOSIS — I129 Hypertensive chronic kidney disease with stage 1 through stage 4 chronic kidney disease, or unspecified chronic kidney disease: Secondary | ICD-10-CM | POA: Diagnosis not present

## 2022-10-01 DIAGNOSIS — N1831 Chronic kidney disease, stage 3a: Secondary | ICD-10-CM | POA: Diagnosis not present

## 2022-10-01 DIAGNOSIS — D631 Anemia in chronic kidney disease: Secondary | ICD-10-CM | POA: Diagnosis not present

## 2022-10-01 NOTE — Chronic Care Management (AMB) (Signed)
    Chronic Care Management Pharmacy Assistant   Name: SOSAIA PITTINGER Sr.  MRN: [id number] DOB: 1946/08/31  10/02/2022 APPOINTMENT [place] Wife was reminded to have all medications, supplements and any blood glucose and blood pressure readings available for review with Jeni Salles, Pharm. D, at his telephone visit on 10/02/2022 at 11:30.  Care Gaps: AWV - scheduled 08/06/2023 Last BP - 120/62 on 08/03/2022 Last A1C - 5.9 on 04/24/2022 Tdap - overdue Covid - overdue Shingrix - postponed Flu - postponed  Star Rating Drug: Losartan 50 mg - last filled 07/08/2022 90 DS at [facility]  Any gaps in medications fill history? No  Gennie Alma [facility]  Catering manager [phone]

## 2022-10-02 ENCOUNTER — Ambulatory Visit: Payer: Medicare Other | Admitting: Pharmacist

## 2022-10-02 VITALS — BP 152/70

## 2022-10-02 DIAGNOSIS — I10 Essential (primary) hypertension: Secondary | ICD-10-CM

## 2022-10-02 DIAGNOSIS — N1831 Chronic kidney disease, stage 3a: Secondary | ICD-10-CM

## 2022-10-02 NOTE — Progress Notes (Signed)
Chronic Care Management Pharmacy Note  10/02/2022 Name:  Scott WIGINGTON Sr. MRN:  [id number] DOB:  11-26-1945  Summary: BP is not at goal < 130/80 per office readings and patient does not check at home Pt is not taking iron as prescribed and only takes once daily  Recommendations/Changes made from today's visit: -Recommended at least weekly  BP monitoring at home -Recommended bringing BP cuff to office visit on Monday -Recommended repeat CBC  Plan: BP assessment in 3-4 weeks   Subjective: Scott Ransom Sr. is an 76 y.o. year old male who is a primary patient of [name], MD.  The CCM team was consulted for assistance with disease management and care coordination needs.    Engaged with patient by telephone for follow up visit in response to provider referral for pharmacy case management and/or care coordination services.   Consent to Services:  The patient was given information about Chronic Care Management services, agreed to services, and gave verbal consent prior to initiation of services.  Please see initial visit note for detailed documentation.   Patient Care Team: [name], MD as PCP - General (Family Medicine) [name], [facility] as Pharmacist (Pharmacist)  Recent office visits: 08/03/22 Grier Mitts, MD: Patient presented for chronic conditions follow up. Follow up in 3 months.   08/03/22 Rolene Arbour, LPN - Medicare annual wellness exam.  06/24/2022 Grier Mitts MD - Patient was seen for hyponatremia and additional concerns. Started Miconazole 2%. No follow up noted.    04/24/2022 Grier Mitts MD - Patient was seen for well adult exam and additional issues. No medication changes. Follow up in 2 months.   Recent consult visits: 06/29/2022 Rod Can MD [facility])  - Patient was seen for history of total knee arthroplasty and additional concerns. No additional chart notes.    06/17/2022 Esmond Plants MD [facility]) - Patient was seen  for pain of right shoulder joint and rotator cuff tear arthropathy. No additional chart notes.    05/06/2022 Esmond Plants MD [facility]) - Patient was seen for rotator cuff tear arthropathy and bilateral shoulder joint pain. No additional chart notes.    04/20/2022 Rod Can MD [facility])  - Patient was seen for Osteoarthritis of right knee joint and additional issues. No additional chart notes.    Hospital visits: None in previous 6 months   Objective:  Lab Results  Component Value Date   CREATININE 1.05 06/24/2022   BUN 10 06/24/2022   GFR 67.99 06/24/2022   GFRNONAA >60 02/14/2021   GFRAA 46 (L) 07/19/2020   NA 135 06/24/2022   K 3.8 06/24/2022   CALCIUM 8.9 06/24/2022   CO2 30 06/24/2022   GLUCOSE 81 06/24/2022    Lab Results  Component Value Date/Time   HGBA1C 5.9 04/24/2022 10:53 AM   HGBA1C 6.3 12/02/2020 04:28 PM   GFR 67.99 06/24/2022 11:38 AM   GFR 54.68 (L) 05/11/2022 11:14 AM    Last diabetic Eye exam: No results found for: "HMDIABEYEEXA"  Last diabetic Foot exam: No results found for: "HMDIABFOOTEX"   Lab Results  Component Value Date   CHOL 196 04/24/2022   HDL 53.80 04/24/2022   LDLCALC 122 (H) 04/24/2022   LDLDIRECT 131.0 01/24/2016   TRIG 102.0 04/24/2022   CHOLHDL 4 04/24/2022       Latest Ref Rng & Units 05/11/2022   11:14 AM 04/24/2022   10:53 AM 02/06/2021    8:22 AM  Hepatic Function  Total Protein 6.0 - 8.3  g/dL 7.1  7.1  7.4   Albumin 3.5 - 5.2 g/dL 4.2  4.2  4.0   AST 0 - 37 U/L _0 ALT 0 - 53 U/L _1 Alk Phosphatase 39 - 117 U/L 117  132  134   Total Bilirubin 0.2 - 1.2 mg/dL 0.5  0.6  0.8     Lab Results  Component Value Date/Time   TSH 1.50 06/24/2022 11:38 AM   TSH 1.39 04/24/2022 10:53 AM   FREET4 0.84 04/24/2022 10:53 AM       Latest Ref Rng & Units 04/24/2022   10:53 AM 05/19/2021   11:53 AM 02/16/2021    5:54 AM  CBC  WBC 4.0 - 10.5 K/uL 4.2  6.9  12.0   Hemoglobin 13.0 - 17.0 g/dL 11.5   10.4  8.5   Hematocrit 39.0 - 52.0 % 35.1  30.9  25.3   Platelets 150.0 - 400.0 K/uL 224.0  257.0  148     No results found for: "VD25OH"  Clinical ASCVD: No  The 10-year ASCVD risk score (Arnett DK, et al., 2019) is: 31%   Values used to calculate the score:     Age: 36 years     Sex: Male     Is Non-Hispanic African American: Yes     Diabetic: No     Tobacco smoker: No     Systolic Blood Pressure: 536 mmHg     Is BP treated: Yes     HDL Cholesterol: 53.8 mg/dL     Total Cholesterol: 196 mg/dL       08/03/2022   10:30 AM 06/24/2022   10:43 AM 04/24/2022   10:17 AM  Depression screen PHQ 2/9  Decreased Interest 0 0 0  Down, Depressed, Hopeless 0 0 0  PHQ - 2 Score 0 0 0  Altered sleeping 0 0 0  Tired, decreased energy 0 0 0  Change in appetite 0 0 0  Feeling bad or failure about yourself  0 0 0  Trouble concentrating 0 0 0  Moving slowly or fidgety/restless 0 0 0  Suicidal thoughts 0 0 0  PHQ-9 Score 0 0 0  Difficult doing work/chores Not difficult at all Not difficult at all Not difficult at all     Social History   Tobacco Use  Smoking Status Never  Smokeless Tobacco Never   BP Readings from Last 3 Encounters:  10/02/22 (!) 152/70  08/03/22 120/62  08/03/22 120/62   Pulse Readings from Last 3 Encounters:  08/03/22 60  08/03/22 60  06/24/22 65   Wt Readings from Last 3 Encounters:  08/03/22 162 lb 12.8 oz (73.8 kg)  08/03/22 162 lb 8 oz (73.7 kg)  06/24/22 165 lb 8 oz (75.1 kg)   BMI Readings from Last 3 Encounters:  08/03/22 24.18 kg/m  08/03/22 24.14 kg/m  06/24/22 24.80 kg/m    Assessment/Interventions: Review of patient past medical history, allergies, medications, health status, including review of consultants reports, laboratory and other test data, was performed as part of comprehensive evaluation and provision of chronic care management services.   SDOH:  (Social Determinants of Health) assessments and interventions performed: Yes  SDOH  Interventions    Flowsheet Row Chronic Care Management from 10/02/2022 in [place] at [place] from 08/03/2022 in [place] at [place] Management from 10/07/2021 in [place] at [place] from 06/23/2021 in [place] at  [place] Clinical Support from 06/24/2020 in [place] at [place] Interventions -- Intervention Not Indicated -- Intervention Not Indicated Intervention Not Indicated  Housing Interventions -- Intervention Not Indicated -- Intervention Not Indicated Intervention Not Indicated  Transportation Interventions -- Intervention Not Indicated Intervention Not Indicated Intervention Not Indicated Intervention Not Indicated  Utilities Interventions -- Intervention Not Indicated -- -- --  Alcohol Usage Interventions -- Intervention Not Indicated (Score <7) -- -- --  Depression Interventions/Treatment  -- -- -- -- PHQ2-9 Score <4 Follow-up Not Indicated  Financial Strain Interventions _0   Physical Activity Interventions -- Intervention Not Indicated -- Intervention Not Indicated Other (Comments)  [Patient states that he is really active around his home doing yard work and playing with his grandchildren]  Stress Interventions -- Intervention Not Indicated -- Intervention Not Indicated Intervention Not Indicated  Social Connections Interventions -- Intervention Not Indicated -- Intervention Not Indicated --       SDOH Screenings   Food Insecurity: No Food Insecurity (08/03/2022)  Housing: Low Risk  (08/03/2022)  Transportation Needs: No Transportation Needs (08/03/2022)  Utilities: Not At Risk (08/03/2022)  Alcohol Screen: Low Risk  (08/03/2022)  Depression (PHQ2-9): Low Risk  (08/03/2022)  Financial Resource Strain: Low Risk   (10/02/2022)  Physical Activity: Inactive (08/03/2022)  Social Connections: Moderately Isolated (08/03/2022)  Stress: No Stress Concern Present (08/03/2022)  Tobacco Use: Low Risk  (08/23/2022)    CCM Care Plan  Allergies  Allergen Reactions   Ferumoxytol Other (See Comments)    Difficulty breathing. Infusion reaction 01/24/21.   Nsaids Other (See Comments)     hx of renal insufficiency   Statins     myalgias    Medications Reviewed Today     Reviewed by [name], MD (Physician) on 08/23/22 at 1500  Med List Status: <None>   Medication Order Taking? Sig Documenting Provider Last Dose Status Informant  acetaminophen (TYLENOL) 325 MG tablet [id number] Yes Take 1-2 tablets (325-650 mg total) by mouth every 6 (six) hours as needed for mild pain (pain score 1-3 or temp > 100.5). Dorothyann Peng, PA-C Taking Active   allopurinol (ZYLOPRIM) 300 MG tablet [id number]  TAKE 1 TABLET BY MOUTH EVERY DAY [name], MD  Active   celecoxib (CELEBREX) 100 MG capsule [id number] Yes TAKE 1 CAPSULE (100 MG TOTAL) BY MOUTH 2 (TWO) TIMES DAILY AS NEEDED FOR MODERATE PAIN (SHOULDER). [name], MD Taking Active   colchicine 0.6 MG tablet [id number] Yes TAKE 1 TABLET EVERY DAY [name], MD Taking Active   Cyanocobalamin 1000 MCG SUBL [id number] Yes Place 1 tablet (1,000 mcg total) under the tongue once a week.  Patient taking differently: Place 1,000 mcg under the tongue 2 (two) times a week. Wednesdays & Sundays   [name], MD Taking Active   Ferrous Sulfate (IRON PO) [id number] Yes Take 1 tablet by mouth in the morning and at bedtime. [provider] Taking Active Self  fluticasone (FLONASE) 50 MCG/ACT nasal spray [id number] Yes SPRAY 2 SPRAYS INTO EACH NOSTRIL EVERY DAY [name], MD Taking Active   hydrALAZINE (APRESOLINE) 10 MG tablet [id number] Yes Take 1 tablet (10 mg total) by mouth 2 (two) times daily. [name], MD Taking Active   losartan  (COZAAR) 50 MG tablet [id number] Yes TAKE 1 TABLET BY MOUTH EVERY DAY [name],  MD Taking Active   miconazole (MICATIN) 2 % cream [id number] Yes Apply 1 Application topically 2 (two) times daily. [name], MD Taking Active   Polyethylene Glycol 3350 POWD [id number] Yes Take 17 g by mouth at bedtime. [provider] Taking Active Self  sildenafil (VIAGRA) 50 MG tablet [id number] Yes Take one half tab (25 mg) daily as needed 1 hour prior to sexual activity. [name], MD Taking Active   traMADol Veatrice Bourbon) 50 MG tablet [id number] Yes TAKE 2 TABLETS (100 MG TOTAL) BY MOUTH EVERY 12 (TWELVE) HOURS AS NEEDED. [name], MD Taking Active             Patient Active Problem List   Diagnosis Date Noted   Degenerative arthritis of left knee 02/13/2021   Osteoarthritis of left knee 12/03/2020   Erectile dysfunction 04/24/2020   S/P carpal tunnel release 04/24/2020   Low back pain 10/22/2014   Rotator cuff tendinitis 06/04/2014   Gout 06/17/2010   CKD (chronic kidney disease) stage 3, GFR 30-59 ml/min (HCC) 06/04/2010   Anemia 08/02/2009   Hyperlipidemia 07/13/2007   Essential hypertension 07/13/2007   GERD 07/13/2007   B12 deficiency 07/07/2007    Immunization History  Administered Date(s) Administered   PFIZER(Purple Top)SARS-COV-2 Vaccination 12/02/2019, 12/30/2019, 08/17/2020   Pneumococcal Conjugate-13 01/30/2014   Pneumococcal Polysaccharide-23 06/16/2013   Td 01/18/2009   Patient reports he thought his appt with Dr. Volanda Napoleon was changed earlier this week and that is why he missed it. Rescheduled his visit for next week.  Patient is not checking his BP at home at all but it was elevated yesterday at the kidney doctor. He has a cuff just doesn't think to use it. He will bring it on Monday to make sure it's accurate.  Patient is also trying to limit his use of Celebrex from here on out. The nephrologist told him to stop using it all together and he isn't sure  if he can do that but will try to use it the least amount possible.  Conditions to be addressed/monitored:  Hypertension, Hyperlipidemia, GERD, Chronic Kidney Disease, Osteoarthritis, and Gout  Conditions addressed this visit: Hypertension, gout, anemia  Care Plan : CCM Pharmacy Care Plan  Updates made by [name], [place] since 10/02/2022 12:00 AM     Problem: Problem: Hypertension, Hyperlipidemia, GERD, Chronic Kidney Disease, Osteoarthritis, and Gout      Long-Range Goal: Patient-Specific Goal   Start Date: 10/07/2021  Expected End Date: 10/07/2022  Recent Progress: On track  Priority: High  Note:   Current Barriers:  Unable to independently monitor therapeutic efficacy Unable to self administer medications as prescribed  Pharmacist Clinical Goal(s):  Patient will achieve adherence to monitoring guidelines and medication adherence to achieve therapeutic efficacy through collaboration with PharmD and provider.   Interventions: 1:1 collaboration with [name], MD regarding development and update of comprehensive plan of care as evidenced by provider attestation and co-signature Inter-disciplinary care team collaboration (see longitudinal plan of care) Comprehensive medication review performed; medication list updated in electronic medical record  Hypertension (BP goal <130/80) -Uncontrolled -Current treatment: Losartan 50 mg 1 tablet daily at bedtime - Appropriate, Query effective, Safe, Accessible Hydralazine 10 mg 1 tablet twice daily - Appropriate, Query effective, Safe, Accessible -Medications previously tried: n/a  -Current home readings: has an arm cuff but does not check often, 150/90 at kidney doctor yesterday -Current dietary habits: does eat out often (usually breakfast at [facility]) -Current exercise habits: no structured exercise -  Denies hypotensive/hypertensive symptoms -Educated on BP goals and benefits of medications for prevention of heart attack,  stroke and kidney damage; Importance of home blood pressure monitoring; Proper BP monitoring technique; Symptoms of hypotension and importance of maintaining adequate hydration; -Counseled to monitor BP at home weekly, document, and provide log at future appointments -Counseled on diet and exercise extensively Recommended to continue current medication Recommended bringing BP cuff to office visit on Monday.  Gout (Goal: uric acid < 6 and prevent flare ups) -Controlled -Current treatment  Allopurinol 300 mg 1 tablet daily - Appropriate, Effective, Safe, Accessible Colchicine 0.6 mg as needed - Appropriate, Effective, Safe, Accessible -Medications previously tried: none  -Counseled on foods and beverages that can increase the risk of gout such as some seafood, red meat, and sweet beverages like tea. Recommended repeat uric acid level  Osteoarthritis (Goal: minimize pain) -Controlled -Current treatment  Celecoxib 100 mg 1 capsule 2 times daily as needed - Appropriate, Effective, Query Safe, Accessible Tramadol 50 mg 2 tablets every 12 hours as needed - Appropriate, Effective, Safe, Accessible Tylenol as needed - Appropriate, Query effective, Safe, Accessible -Medications previously tried: n/a  -Counseled on limiting use of NSAIDs due to risk of bleeding and kidney damage. Patient plans to stop taking the Celebrex as much as possible.  Health Maintenance -Vaccine gaps: shingrix, tetanus, COVID booster -Current therapy:  Vitamin B12 1000 mcg SL twice weekly Ferrous sulfate 1 tablet once daily Sildenafil 50 mg 1 tablet as needed Miralax as needed Flonase nasal spray as needed -Educated on Cost vs benefit of each product must be carefully weighed by individual consumer -Patient is satisfied with current therapy and denies issues -Recommended restarting iron supplementation.  Patient Goals/Self-Care Activities Patient will:  - take medications as prescribed as evidenced by patient  report and record review check blood pressure weekly, document, and provide at future appointments target a minimum of 150 minutes of moderate intensity exercise weekly  Follow Up Plan: The care management team will reach out to the patient again over the next 21 days.        Medication Assistance: None required.  Patient affirms current coverage meets needs.  Compliance/Adherence/Medication fill history: Care Gaps: COVID booster, shingrix, tetanus, influenza Last BP - 120/62 on 08/03/2022 Last A1C - 5.9 on 04/24/2022  Star-Rating Drugs: Losartan 50 mg - last [facility]08/2022 [facility]   Patient's preferred pharmacy is:  [id number] [place] [id number]: [phone]: [phone] Uses pill box? No - uses the bottle Pt endorses 95% compliance    We discussed: Current pharmacy is preferred with insurance plan and patient is satisfied with pharmacy services Patient decided to: Continue current medication management strategy  Care Plan and Follow Up Patient Decision:  Patient agrees to Care Plan and Follow-up.  Plan: The care management team will reach out to the patient again over the next 21 days.  MJeni Salles PharmD, [place]armac[place]

## 2022-10-02 NOTE — Patient Instructions (Addendum)
Please bring your blood pressure cuff on Monday to your appointment Try to get the COVID booster, shingles and tetanus shots at the pharmacy  [place], PharmD, [place] at [place]

## 2022-10-05 ENCOUNTER — Ambulatory Visit (INDEPENDENT_AMBULATORY_CARE_PROVIDER_SITE_OTHER): Payer: Medicare Other | Admitting: Family Medicine

## 2022-10-05 ENCOUNTER — Other Ambulatory Visit: Payer: Self-pay | Admitting: Family Medicine

## 2022-10-05 ENCOUNTER — Encounter: Payer: Self-pay | Admitting: Family Medicine

## 2022-10-05 VITALS — BP 142/70 | HR 60 | Temp 98.1°F | Wt 169.4 lb

## 2022-10-05 DIAGNOSIS — R239 Unspecified skin changes: Secondary | ICD-10-CM

## 2022-10-05 DIAGNOSIS — G8929 Other chronic pain: Secondary | ICD-10-CM

## 2022-10-05 NOTE — Progress Notes (Signed)
Subjective:    Patient ID: Scott Apo., male    DOB: [birth year], 76 y.o.   MRN: [id number]  Chief Complaint  Patient presents with   OTHER    Pt reports he has navel discharge. Noticed it about 2 weeks ago. Denied any pain or swelling around the navel.     HPI Patient was seen today for acute concern.  Pt noticed clear fluid from navel 2 wks ago.  Has not noticed any drainage since.  Pt denies pruritus, erythema, edema, irritation.  Patient is yet to hear about referrals for urology for elevated PSA or for removal of lesion on nose.  Patient states he checked with the dermatology office and there is which cannot get him in until 2024.  Endorses knee pain worse with going downstairs.  Past Medical History:  Diagnosis Date   ANEMIA, OTHER UNSPEC 08/02/2009   Arthritis    B12 DEFICIENCY 07/07/2007   Bell's palsy     around 2005   BPPV (benign paroxysmal positional vertigo)    2003   Cataract    EKG abnormalities    GERD 07/13/2007   Gout, unspecified 06/17/2010   HYPERLIPIDEMIA 07/13/2007   HYPERTENSION 07/13/2007   RENAL INSUFFICIENCY 06/04/2010    Allergies  Allergen Reactions   Ferumoxytol Other (See Comments)    Difficulty breathing. Infusion reaction 01/24/21.   Nsaids Other (See Comments)     hx of renal insufficiency   Statins     myalgias    ROS General: Denies fever, chills, night sweats, changes in weight, changes in appetite HEENT: Denies headaches, ear pain, changes in vision, rhinorrhea, sore throat CV: Denies CP, palpitations, SOB, orthopnea Pulm: Denies SOB, cough, wheezing GI: Denies abdominal pain, nausea, vomiting, diarrhea, constipation GU: Denies dysuria, hematuria, frequency Msk: Denies muscle cramps, joint pains +knee pain worse Neuro: Denies weakness, numbness, tingling Skin: Denies rashes, bruising  +clear fluid from navel Psych: Denies depression, anxiety, hallucinations     Objective:    Blood pressure (!) 142/70, pulse 60, temperature  98.1 F (36.7 C), temperature source Oral, weight 169 lb 6.4 oz (76.8 kg), SpO2 98 %.  Gen. Pleasant, well-nourished, in no distress, normal affect   HEENT: [place]/AT, face symmetric, conjunctiva clear, no scleral icterus, PERRLA, EOMI, nares patent without drainage Lungs: no accessory muscle use Cardiovascular: RRR,  no peripheral edema Abdomen: BS present, soft, NT/ND, no hepatosplenomegaly. Neuro:  A&Ox3, CN II-XII intact, normal gait Skin:  Warm, no lesions/ rash.  No drainage, erythema, edema, or rash in or around umbilicus.  No fluid noted on cotton swab in navel.   Wt Readings from Last 3 Encounters:  10/05/22 169 lb 6.4 oz (76.8 kg)  08/03/22 162 lb 12.8 oz (73.8 kg)  08/03/22 162 lb 8 oz (73.7 kg)    Lab Results  Component Value Date   WBC 4.2 04/24/2022   HGB 11.5 (L) 04/24/2022   HCT 35.1 (L) 04/24/2022   PLT 224.0 04/24/2022   GLUCOSE 81 06/24/2022   CHOL 196 04/24/2022   TRIG 102.0 04/24/2022   HDL 53.80 04/24/2022   LDLDIRECT 131.0 01/24/2016   LDLCALC 122 (H) 04/24/2022   ALT 12 05/11/2022   AST 24 05/11/2022   NA 135 06/24/2022   K 3.8 06/24/2022   CL 97 06/24/2022   CREATININE 1.05 06/24/2022   BUN 10 06/24/2022   CO2 30 06/24/2022   TSH 1.50 06/24/2022   PSA 4.36 (H) 04/24/2022   INR 1.0 02/06/2021   HGBA1C 5.9 04/24/2022  Assessment/Plan:  Skin complaints -currently asymptomatic -umbilicus normal in appearance. -fluid noted likely water from bathing.   -continue to monitor  Awaiting response from referral coordinator regarding info on referrals to Derm and Urology previously placed and authorized May 2023.  F/u prn  Grier Mitts, MD

## 2022-10-07 ENCOUNTER — Telehealth: Payer: Self-pay

## 2022-10-07 NOTE — Telephone Encounter (Signed)
Attempt to reach patient using home phone to update him on his referrals. Left a voicemail to call us back.

## 2022-10-13 DIAGNOSIS — I129 Hypertensive chronic kidney disease with stage 1 through stage 4 chronic kidney disease, or unspecified chronic kidney disease: Secondary | ICD-10-CM | POA: Diagnosis not present

## 2022-10-20 ENCOUNTER — Telehealth: Payer: Self-pay | Admitting: Pharmacist

## 2022-10-20 NOTE — Chronic Care Management (AMB) (Signed)
    Chronic Care Management Pharmacy Assistant   Name: Scott BETSCH Sr.  MRN: [id number] DOB: December 16, 1945  Reason for Encounter: Follow up home blood pressures.     Spoke with patient's wife, she states he has not been checking his blood pressures at home.  He only checks blood pressures when he goes to the pharmacy.  Patients wife will remind him to start checking blood pressures and recording them at home.   [place] Pharmacist Assistant [phone]

## 2022-10-29 NOTE — Chronic Care Management (AMB) (Signed)
Spoke with patient today about his home blood pressure readings. Patient states he has not been checking his blood pressures at home due to his monitor not working.  He states he does check his blood pressure at the drug store once or twice a month, he does not recall the actual readings however, states they are always in normal range.  Patient states he will start recording his blood pressure readings when he checks them at the drug store.

## 2022-10-30 DIAGNOSIS — L821 Other seborrheic keratosis: Secondary | ICD-10-CM | POA: Diagnosis not present

## 2022-11-04 ENCOUNTER — Encounter: Payer: Self-pay | Admitting: Family Medicine

## 2022-11-04 ENCOUNTER — Ambulatory Visit (INDEPENDENT_AMBULATORY_CARE_PROVIDER_SITE_OTHER): Payer: Medicare Other | Admitting: Family Medicine

## 2022-11-04 VITALS — BP 170/80 | HR 56 | Temp 98.2°F | Wt 167.0 lb

## 2022-11-04 DIAGNOSIS — I10 Essential (primary) hypertension: Secondary | ICD-10-CM | POA: Diagnosis not present

## 2022-11-04 DIAGNOSIS — M79605 Pain in left leg: Secondary | ICD-10-CM

## 2022-11-04 DIAGNOSIS — M79604 Pain in right leg: Secondary | ICD-10-CM

## 2022-11-04 NOTE — Progress Notes (Signed)
Subjective:    Patient ID: Scott Apo., male    DOB: September 21, 1946, 77 y.o.   MRN: [id number]  Chief Complaint  Patient presents with   Leg Pain    Pt reports leg pain on both leg. More on R side. Noticed it 1 wk and a half ago. Feels pain when walk, bend over and turn "wrong way".    HPI Patient is a 77 year old male with PMH SIG for HTN, GERD, OA, rotator cuff tendinitis/tear, gout, CKD, HLD, low back and neck pain who was seen today for acute concern.  Patient endorses bilateral leg pain x 1.5 weeks.  Right leg worse than left.  Pain in Up to the hip.  Patient denies injury.  Notices sensation when getting up from seated position and with certain movements.  Feels better when walking/moving.  Denies edema, R knee or hip pain. States feeling better today.  Also with right-sided neck pain when turning head towards right.  Patient surprised elevation of blood pressure.  Took medication prior to appointment.  Endorses eating collards with ham and peas with ham for the last 2-3 days.  Also taking Celebrex 100 mg daily for low back pain.    Pt has appointment with Urology tomorrow at 10 am for elevated PSA.  Past Medical History:  Diagnosis Date   ANEMIA, OTHER UNSPEC 08/02/2009   Arthritis    B12 DEFICIENCY 07/07/2007   Bell's palsy     around 2005   BPPV (benign paroxysmal positional vertigo)    2003   Cataract    EKG abnormalities    GERD 07/13/2007   Gout, unspecified 06/17/2010   HYPERLIPIDEMIA 07/13/2007   HYPERTENSION 07/13/2007   RENAL INSUFFICIENCY 06/04/2010    Allergies  Allergen Reactions   Ferumoxytol Other (See Comments)    Difficulty breathing. Infusion reaction 01/24/21.   Nsaids Other (See Comments)     hx of renal insufficiency   Statins     myalgias    ROS General: Denies fever, chills, night sweats, changes in weight, changes in appetite HEENT: Denies headaches, ear pain, changes in vision, rhinorrhea, sore throat CV: Denies CP, palpitations, SOB,  orthopnea Pulm: Denies SOB, cough, wheezing GI: Denies abdominal pain, nausea, vomiting, diarrhea, constipation GU: Denies dysuria, hematuria, frequency, vaginal discharge Msk: Denies muscle cramps, joint pains +b/l LE pain, R sided neck pain Neuro: Denies weakness, numbness, tingling Skin: Denies rashes, bruising Psych: Denies depression, anxiety, hallucinations     Objective:    Blood pressure (!) 158/79, pulse (!) 56, temperature 98.2 F (36.8 C), temperature source Oral, weight 167 lb (75.8 kg), SpO2 98 %.  Gen. Pleasant, well-nourished, in no distress, normal affect   HEENT: [place]/AT, face symmetric, conjunctiva clear, no scleral icterus, PERRLA, EOMI, nares patent without drainage Lungs: no accessory muscle use, CTAB, no wheezes or rales Cardiovascular: RRR, no m/r/g, no peripheral edema Abdomen: BS present, soft, NT/ND, no hepatosplenomegaly. Musculoskeletal: Negative Homans' sign.  No TTP of bilateral calves or thighs.  No TTP headache nerves bilaterally.  No edema of bilateral LEs.  Left leg 4/5 strength, right leg 5/5 strength.  Decreased abduction of bilateral hips.  Negative FADIR, FABER, logroll, straight leg raise.  Limited ROM bilateral shoulders.  No deformities, no cyanosis or clubbing, normal tone Neuro:  A&Ox3, CN II-XII intact, normal gait Skin:  Warm, no lesions/ rash.  No discoloration of bilateral LEs.   Wt Readings from Last 3 Encounters:  11/04/22 167 lb (75.8 kg)  10/05/22 169 lb 6.4 oz (  76.8 kg)  08/03/22 162 lb 12.8 oz (73.8 kg)    Lab Results  Component Value Date   WBC 4.2 04/24/2022   HGB 11.5 (L) 04/24/2022   HCT 35.1 (L) 04/24/2022   PLT 224.0 04/24/2022   GLUCOSE 81 06/24/2022   CHOL 196 04/24/2022   TRIG 102.0 04/24/2022   HDL 53.80 04/24/2022   LDLDIRECT 131.0 01/24/2016   LDLCALC 122 (H) 04/24/2022   ALT 12 05/11/2022   AST 24 05/11/2022   NA 135 06/24/2022   K 3.8 06/24/2022   CL 97 06/24/2022   CREATININE 1.05 06/24/2022   BUN 10  06/24/2022   CO2 30 06/24/2022   TSH 1.50 06/24/2022   PSA 4.36 (H) 04/24/2022   INR 1.0 02/06/2021   HGBA1C 5.9 04/24/2022    Assessment/Plan:  Bilateral lower extremity pain -Improving -Discussed supportive care including stretching -For continued or worsening symptoms discussed vascular consult and PT.  Offered referral this visit, pt declines.  Essential hypertension -Elevated -Recheck 158/79 by this provider -Possibly elevated 2/2 recent increased sodium intake or pain -Discussed decreasing sodium intake -Continue current medications including hydralazine 10 mg twice daily, losartan 100 mg daily  F/u prn in 4-6 wks  Grier Mitts, MD

## 2022-11-05 ENCOUNTER — Telehealth: Payer: Self-pay | Admitting: Family Medicine

## 2022-11-05 NOTE — Telephone Encounter (Signed)
Patient calling to advise that since there was a change in his blood pressure meds his pressure has been elevated. Coming in with his wife tomorrow and was requesting to have it check. Advised there were no appointments tomororow.

## 2022-11-06 NOTE — Telephone Encounter (Signed)
Spoke to patient. Pt was seen with Dr. Volanda Napoleon on 11/04/2022. Inform pt we can have his BP check when he came in with his wife. Verbalize understanding.

## 2022-11-23 ENCOUNTER — Ambulatory Visit (INDEPENDENT_AMBULATORY_CARE_PROVIDER_SITE_OTHER): Payer: Medicare Other | Admitting: Family Medicine

## 2022-11-23 VITALS — BP 150/78 | HR 64 | Temp 98.0°F | Ht 68.8 in | Wt 164.0 lb

## 2022-11-23 DIAGNOSIS — R109 Unspecified abdominal pain: Secondary | ICD-10-CM | POA: Diagnosis not present

## 2022-11-23 DIAGNOSIS — I10 Essential (primary) hypertension: Secondary | ICD-10-CM | POA: Diagnosis not present

## 2022-11-23 DIAGNOSIS — R10A2 Flank pain, left side: Secondary | ICD-10-CM

## 2022-11-23 LAB — POCT URINALYSIS DIPSTICK
Bilirubin, UA: NEGATIVE
Blood, UA: POSITIVE
Glucose, UA: NEGATIVE
Ketones, UA: NEGATIVE
Leukocytes, UA: NEGATIVE
Nitrite, UA: NEGATIVE
Protein, UA: POSITIVE — AB
Spec Grav, UA: 1.015 (ref 1.010–1.025)
Urobilinogen, UA: 0.2 E.U./dL
pH, UA: 6 (ref 5.0–8.0)

## 2022-11-23 MED ORDER — HYDRALAZINE HCL 25 MG PO TABS: 25.0000 mg | ORAL_TABLET | Freq: Two times a day (BID) | ORAL | 3 refills | Status: DC

## 2022-11-23 NOTE — Progress Notes (Signed)
Established Patient Office Visit   Subjective  Patient ID: Scott Weinreb., male    DOB: 1946/01/10  Age: 77 y.o. MRN: [id number]  Chief Complaint  Patient presents with   Medical Management of Chronic Issues    Following up on BP.    Patient is a 77 year old male who comes in for follow-up on BP.  Patient endorses elevated BP readings at home, systolic 324M.  Patient denies headaches, CP, LE edema, changes in vision.  Taking losartan 100 mg daily and hydralazine 10 mg twice daily.  patient states in the past his blood pressure was well-controlled on triamterene-HCTZ.  Medication changed due to declining renal function and hyponatremia.  Hyponatremia improved after discontinuing medication.  Patient also endorses L flank/lateral abdominal pain.  Does not recall injury.  Denies dysuria, hematuria, nausea, vomiting, pain that moves.      ROS Negative unless stated above    Objective:     BP (!) 150/78 (BP Location: Left Arm, Cuff Size: Large)   Pulse 64   Temp 98 F (36.7 C) (Oral)   Ht 5' 8.8" (1.748 m)   Wt 164 lb (74.4 kg)   SpO2 98%   BMI 24.36 kg/m    Physical Exam Constitutional:      General: He is not in acute distress.    Appearance: Normal appearance.  HENT:     Head: Normocephalic and atraumatic.     Nose: Nose normal.     Mouth/Throat:     Mouth: Mucous membranes are moist.  Cardiovascular:     Rate and Rhythm: Normal rate and regular rhythm.     Heart sounds: Normal heart sounds. No murmur heard.    No gallop.  Pulmonary:     Effort: Pulmonary effort is normal. No respiratory distress.     Breath sounds: Normal breath sounds. No wheezing, rhonchi or rales.  Abdominal:     General: Bowel sounds are normal.     Palpations: Abdomen is soft. There is no mass.     Tenderness: There is no abdominal tenderness. There is no right CVA tenderness, left CVA tenderness, guarding or rebound.  Skin:    General: Skin is warm and dry.  Neurological:      Mental Status: He is alert and oriented to person, place, and time.      Results for orders placed or performed in visit on 11/23/22  POC Urinalysis Dipstick  Result Value Ref Range   Color, UA yellow    Clarity, UA clear    Glucose, UA Negative Negative   Bilirubin, UA negative    Ketones, UA negative    Spec Grav, UA 1.015 1.010 - 1.025   Blood, UA Positive    pH, UA 6.0 5.0 - 8.0   Protein, UA Positive (A) Negative   Urobilinogen, UA 0.2 0.2 or 1.0 E.U./dL   Nitrite, UA Negative    Leukocytes, UA Negative Negative   Appearance     Odor        Assessment & Plan:  Essential hypertension -Elevated -Will have patient check BP readings throughout the day. -Increased hydralazine from 10 mg twice daily to 25 mg twice daily.  For continued elevation in BP use hydralazine 25 mg 3 times daily. -Continue losartan 100 mg daily -Lifestyle modifications encouraged -     POCT urinalysis dipstick -     hydrALAZINE HCl; Take 1 tablet (25 mg total) by mouth in the morning and at bedtime.  Dispense: 60 tablet;  Refill: 3  Left flank pain -Discussed possible causes including UTI, renal calculi, muscle strain, constipation.  Obtain UA to rule out UTI and assess for hematuria. -     POCT urinalysis dipstick   Return if symptoms worsen or fail to improve.  In the next 2-4 weeks, sooner if needed.  [name], MD

## 2022-11-30 ENCOUNTER — Telehealth: Payer: Self-pay | Admitting: Family Medicine

## 2022-11-30 NOTE — Telephone Encounter (Signed)
Pt called to inform MD that the BP medication is not working. Pt says BP is 180-182 x 2 days. Pt did not know his Diastolic. Pt transferred to Triage Nurse. Please advise.

## 2022-12-02 NOTE — Telephone Encounter (Signed)
Have pt take hydralazine 25 mg 3 times daily and losartan 100 mg daily.  Monitor blood pressure.  For continued elevation greater than 140/90 notify clinic.

## 2022-12-03 NOTE — Telephone Encounter (Signed)
Attempt to reach pt. Voicemail has not set up. Will try again.

## 2022-12-04 NOTE — Telephone Encounter (Signed)
Inform pt of Dr. Volanda Napoleon' instruction below. Pt verbalized understanding.

## 2022-12-04 NOTE — Telephone Encounter (Signed)
Attempt to reach pt with home phone. Voicemail has not set up. Will try mobile phone.

## 2022-12-09 ENCOUNTER — Encounter: Payer: Self-pay | Admitting: Family Medicine

## 2022-12-16 ENCOUNTER — Encounter: Payer: Self-pay | Admitting: Family Medicine

## 2022-12-16 ENCOUNTER — Ambulatory Visit (INDEPENDENT_AMBULATORY_CARE_PROVIDER_SITE_OTHER): Payer: Medicare Other | Admitting: Family Medicine

## 2022-12-16 VITALS — BP 142/80 | HR 64 | Temp 98.4°F | Ht 68.8 in | Wt 168.0 lb

## 2022-12-16 DIAGNOSIS — I10 Essential (primary) hypertension: Secondary | ICD-10-CM | POA: Diagnosis not present

## 2022-12-16 DIAGNOSIS — N182 Chronic kidney disease, stage 2 (mild): Secondary | ICD-10-CM

## 2022-12-16 MED ORDER — HYDRALAZINE HCL 25 MG PO TABS: 25.0000 mg | ORAL_TABLET | Freq: Three times a day (TID) | ORAL | 3 refills | Status: DC

## 2022-12-16 MED ORDER — BLOOD PRESSURE CUFF MISC: 1.0000 | Freq: Every day | 0 refills | Status: AC

## 2022-12-16 NOTE — Patient Instructions (Addendum)
Please do not feel like you are bothering me.  If something is going on and you need to be seen it is okay to schedule an appointment.  I do not mind at all.  Will recheck kidney function in August it had improved to chronic kidney disease stage II.  Will have you start taking hydralazine 25 mg 3 times a day.  Continue losartan 100 mg once a day.  Check your blood pressure at home and write down the readings.  If you get numbers consistently greater then 140/90 notify the clinic.  The next time you come in bring your home blood pressure cuff with you.

## 2022-12-16 NOTE — Progress Notes (Signed)
   Established Patient Office Visit   Subjective  Patient ID: Scott [name]., male    DOB: 10-14-1946  Age: 77 y.o. MRN: [id number]  Chief Complaint  Patient presents with   Medical Management of Chronic Issues    Patient is a 77 year old male who is seen for follow-up on HTN.  Patient taking losartan 100 mg daily and hydralazine 25 mg 2 times daily.  Pt was advised to take Hydralazine TID at the beginning of February after calling clinic about elevated bp.  States BP at home 140s-160s/70s-80.  Changed batteries in cuff.  Unsure how old cuff is as got it from a family member.  Patient previously well-controlled on HCTZ however medication stopped 2/2 worsening renal function.  Renal function much improved since.     ROS Negative unless stated above    Objective:     BP (!) 142/80   Pulse 64   Temp 98.4 F (36.9 C) (Oral)   Ht 5' 8.8" (1.748 m)   Wt 168 lb (76.2 kg)   SpO2 98%   BMI 24.95 kg/m    Physical Exam Constitutional:      General: He is not in acute distress.    Appearance: Normal appearance.  HENT:     Head: Normocephalic and atraumatic.     Nose: Nose normal.     Mouth/Throat:     Mouth: Mucous membranes are moist.  Eyes:     Extraocular Movements: Extraocular movements intact.     Conjunctiva/sclera: Conjunctivae normal.     Pupils: Pupils are equal, round, and reactive to light.  Cardiovascular:     Rate and Rhythm: Normal rate and regular rhythm.     Heart sounds: Murmur heard.     No gallop.  Pulmonary:     Effort: Pulmonary effort is normal. No respiratory distress.     Breath sounds: Normal breath sounds. No wheezing, rhonchi or rales.  Skin:    General: Skin is warm and dry.  Neurological:     Mental Status: He is alert and oriented to person, place, and time.     No results found for any visits on 12/16/22.    Assessment & Plan:  Essential hypertension -elevated -start hydralazine 25 mg TID -continue losartan 100 mg -continue  lifestyle modifications -continue monitoring bp at home, notify clinic for elevations consistently >140/90.  Given rx for bp cuff.   -     Basic metabolic panel -     TSH -     Blood Pressure Cuff; 1 Device by Does not apply route daily.  Dispense: 1 each; Refill: 0 -     hydrALAZINE HCl; Take 1 tablet (25 mg total) by mouth 3 (three) times daily.  Dispense: 90 tablet; Refill: 3  Chronic kidney disease, stage 2, mildly decreased GFR -improved since stopping HCTZ over the summer. -creatinine 1.05 and GFR 67.99 on 06/24/2022 -     CBC with Differential/Platelet -      Basic metabolic panel   Return in about 3 weeks (around 01/06/2023) for blood pressure re-check.   [name], MD

## 2022-12-17 LAB — CBC WITH DIFFERENTIAL/PLATELET
Basophils Absolute: 0.1 10*3/uL (ref 0.0–0.1)
Basophils Relative: 1.1 % (ref 0.0–3.0)
Eosinophils Absolute: 0.2 10*3/uL (ref 0.0–0.7)
Eosinophils Relative: 2.4 % (ref 0.0–5.0)
HCT: 35.7 % — ABNORMAL LOW (ref 39.0–52.0)
Hemoglobin: 11.7 g/dL — ABNORMAL LOW (ref 13.0–17.0)
Lymphocytes Relative: 23.5 % (ref 12.0–46.0)
Lymphs Abs: 1.5 10*3/uL (ref 0.7–4.0)
MCHC: 32.8 g/dL (ref 30.0–36.0)
MCV: 85.8 fl (ref 78.0–100.0)
Monocytes Absolute: 0.6 10*3/uL (ref 0.1–1.0)
Monocytes Relative: 9.2 % (ref 3.0–12.0)
Neutro Abs: 4.2 10*3/uL (ref 1.4–7.7)
Neutrophils Relative %: 63.8 % (ref 43.0–77.0)
Platelets: 191 10*3/uL (ref 150.0–400.0)
RBC: 4.16 Mil/uL — ABNORMAL LOW (ref 4.22–5.81)
RDW: 15.3 % (ref 11.5–15.5)
WBC: 6.5 10*3/uL (ref 4.0–10.5)

## 2022-12-17 LAB — BASIC METABOLIC PANEL
BUN: 12 mg/dL (ref 6–23)
CO2: 29 mEq/L (ref 19–32)
Calcium: 9.1 mg/dL (ref 8.4–10.5)
Chloride: 98 mEq/L (ref 96–112)
Creatinine, Ser: 1.12 mg/dL (ref 0.40–1.50)
GFR: 62.71 mL/min (ref >60.00)
Glucose, Bld: 105 mg/dL — ABNORMAL HIGH (ref 70–99)
Potassium: 3.4 mEq/L — ABNORMAL LOW (ref 3.5–5.1)
Sodium: 136 mEq/L (ref 135–145)

## 2022-12-17 LAB — TSH: TSH: 1.49 u[IU]/mL (ref 0.35–5.50)

## 2022-12-18 ENCOUNTER — Other Ambulatory Visit: Payer: Self-pay | Admitting: Family Medicine

## 2022-12-18 DIAGNOSIS — E876 Hypokalemia: Secondary | ICD-10-CM

## 2022-12-18 MED ORDER — POTASSIUM CHLORIDE CRYS ER 20 MEQ PO TBCR: 20.0000 meq | EXTENDED_RELEASE_TABLET | Freq: Every day | ORAL | 0 refills | Status: AC

## 2022-12-28 ENCOUNTER — Encounter: Payer: Self-pay | Admitting: Family Medicine

## 2022-12-28 ENCOUNTER — Ambulatory Visit (INDEPENDENT_AMBULATORY_CARE_PROVIDER_SITE_OTHER): Payer: Medicare Other | Admitting: Family Medicine

## 2022-12-28 VITALS — BP 140/60 | HR 65 | Temp 98.4°F | Ht 68.8 in | Wt 169.8 lb

## 2022-12-28 DIAGNOSIS — M79661 Pain in right lower leg: Secondary | ICD-10-CM

## 2022-12-28 DIAGNOSIS — I10 Essential (primary) hypertension: Secondary | ICD-10-CM | POA: Diagnosis not present

## 2022-12-28 NOTE — Progress Notes (Signed)
   Established Patient Office Visit   Subjective  Patient ID: Scott [name]., male    DOB: August 06, 1946  Age: 77 y.o. MRN: [id number]  Chief Complaint  Patient presents with   Leg Pain    Patient complains of right leg pain, x1 week, Denies any known injury     Patient is a 77 year old male with pmh sig for HTN, CKD 3, OA left knee s/p TKR, ED, BPH who presents with R calf pain x 1 wk.  Pt denies injury or overuse.  States calf hurts while trying to stand after sitting on the edge of the bed and at times with walking.  Pt denies edema or erythema.  Pt wears an insole in R shoe as LLE feels slightly longer s/p L TKR.  Pt states home bp cuff reads about 8 points higher than readings here in clinic.  Bp 140s-150s/70s since increasing hydralazine to TID.  Also taking losartan 100 mg daily.      ROS Negative unless stated above    Objective:     BP (!) 140/60 (BP Location: Left Arm, Patient Position: Sitting, Cuff Size: Normal)   Pulse 65   Temp 98.4 F (36.9 C) (Oral)   Ht 5' 8.8" (1.748 m)   Wt 169 lb 12.8 oz (77 kg)   SpO2 98%   BMI 25.22 kg/m    Physical Exam Constitutional:      General: He is not in acute distress.    Appearance: Normal appearance.  HENT:     Head: Normocephalic and atraumatic.     Nose: Nose normal.     Mouth/Throat:     Mouth: Mucous membranes are moist.  Cardiovascular:     Rate and Rhythm: Normal rate and regular rhythm.     Heart sounds: Normal heart sounds. No murmur heard.    No gallop.  Pulmonary:     Effort: Pulmonary effort is normal. No respiratory distress.     Breath sounds: Normal breath sounds. No wheezing, rhonchi or rales.  Musculoskeletal:     Right lower leg: No swelling or tenderness. No edema.     Left lower leg: No swelling, deformity or tenderness. No edema.     Comments: R calf 30.8 cm, L calf 30.7 cm.  Skin:    General: Skin is warm and dry.  Neurological:     Mental Status: He is alert and oriented to person,  place, and time.      No results found for any visits on 12/28/22.    Assessment & Plan:  Right calf pain -New problem -Discussed possible causes including overuse, DVT -Will obtain ultrasound to rule out DVT -Discussed strengthening exercises.  Patient to obtain new insole for shoe as likely favoring left leg s/p left TKR. -Continue supportive care -Referral if needed based on imaging results. -     VAS [place] LOWER EXTREMITY VENOUS (DVT); Future  Essential hypertension -controlled -Continue hydralazine 25 mg 3 times daily and losartan 100 mg daily. -Continue lifestyle modifications -Continue to monitor   Return if symptoms worsen or fail to improve.   [name], MD

## 2022-12-29 ENCOUNTER — Ambulatory Visit (HOSPITAL_COMMUNITY): Payer: Medicare Other

## 2022-12-29 ENCOUNTER — Other Ambulatory Visit: Payer: Self-pay | Admitting: Family Medicine

## 2022-12-29 DIAGNOSIS — G8929 Other chronic pain: Secondary | ICD-10-CM

## 2022-12-31 ENCOUNTER — Encounter (HOSPITAL_COMMUNITY): Payer: Medicare Other

## 2023-01-01 ENCOUNTER — Ambulatory Visit (HOSPITAL_COMMUNITY)
Admission: RE | Admit: 2023-01-01 | Discharge: 2023-01-01 | Disposition: A | Discharge status: Home / Self Care | Payer: Medicare Other | Source: Ambulatory Visit | Attending: Family Medicine | Admitting: Family Medicine

## 2023-01-01 DIAGNOSIS — M79661 Pain in right lower leg: Secondary | ICD-10-CM | POA: Diagnosis not present

## 2023-01-22 ENCOUNTER — Other Ambulatory Visit: Payer: Self-pay | Admitting: Family Medicine

## 2023-02-08 ENCOUNTER — Other Ambulatory Visit: Payer: Self-pay | Admitting: Family Medicine

## 2023-02-08 DIAGNOSIS — I10 Essential (primary) hypertension: Secondary | ICD-10-CM

## 2023-02-17 ENCOUNTER — Other Ambulatory Visit: Payer: Self-pay | Admitting: Family Medicine

## 2023-02-17 DIAGNOSIS — I10 Essential (primary) hypertension: Secondary | ICD-10-CM

## 2023-02-18 ENCOUNTER — Telehealth: Payer: Self-pay

## 2023-02-18 NOTE — Progress Notes (Signed)
Care Management & Coordination Services Pharmacy Team  Reason for Encounter: Hypertension  Contacted patient to discuss hypertension disease state. Spoke with patient on 02/18/2023     Current antihypertensive regimen:  Hydralazine 25 mg three times daily Losartan 100 mg daily Patient verbally confirms he is taking the above medications as directed. Yes  How often are you checking your Blood Pressure? daily, he checks his blood pressure in the morning after taking his medication.  Current home BP readings: Patient states he doesn't keep a log of his readings. He states his blood pressures were running at times around 150/70's however, for the past week his readings have been around 130/60.  Wrist or arm cuff: Arm cuff  Any readings above 180/100? Patient denies  What recent interventions/DTPs have been made by any provider to improve Blood Pressure control since last CPP Visit:  Increased Hydralazine 25 tid  Any recent hospitalizations or ED visits since last visit with CPP? No recent hospital visits.   What diet changes have been made to improve Blood Pressure Control?  Patient follows  Breakfast - doesn't eat breakfast Lunch - apple pie  Dinner - meat vegetables and beams Caffeine intake - soda and tea daily pt will drink a glass of water after his soda Salt intake - doesn't add salt  What exercise is being done to improve your Blood Pressure Control?  Patent is working in the yard daily  Adherence Review: Is the patient currently on ACE/ARB medication? Yes Does the patient have >5 day gap between last estimated fill dates? No  Care Gaps: AWV - completed 08/03/2022, scheduled 08/06/2023 Last BP - 140/60 on 12/28/2022 Last A1C - 5.9 on 04/24/2022 Shingrix - never done Tdap - overdue Covid - overdue   Star Rating Drug: Losartan 100 mg - last filled 12/08/2022 90 DS at [facility]  Chart Updates: Recent office visits:  12/28/2022 Abbe Amsterdam MD - Patient was seen for Right  calf pain and essential hypertension. No medication changes.   12/16/2022 Abbe Amsterdam MD - Patient was seen for essential hypertension and an additional concern. Increased Hydralazine 25 tid.   11/23/2022 Abbe Amsterdam MD - Patient was seen for essential hypertension and an additional concern. Increased Hydralazine to 25 mg twice daily.   11/04/2022 Abbe Amsterdam MD - Patient was seen for Bilateral lower extremity pain and an additional concern. Losartan increased to 100 mg daily. Discontinued triamterene HCTZ.   10/05/2022 Abbe Amsterdam MD - Patient was seen for skin complaints. No medication changes.   Recent consult visits:  10/30/2022 Mickel Crow (dermatology) - Patient was seen for other seborrheic keratosis. No additional chart notes.   Hospital visits:  None  Medications: Outpatient Encounter Medications as of 02/18/2023  Medication Sig   acetaminophen (TYLENOL) 325 MG tablet Take 1-2 tablets (325-650 mg total) by mouth every 6 (six) hours as needed for mild pain (pain score 1-3 or temp > 100.5).   allopurinol (ZYLOPRIM) 300 MG tablet TAKE 1 TABLET BY MOUTH EVERY DAY   Blood Pressure Monitoring (BLOOD PRESSURE CUFF) MISC 1 Device by Does not apply route daily.   celecoxib (CELEBREX) 100 MG capsule TAKE 1 CAPSULE (100 MG TOTAL) BY MOUTH 2 (TWO) TIMES DAILY AS NEEDED FOR MODERATE PAIN (SHOULDER).   colchicine 0.6 MG tablet TAKE 1 TABLET EVERY DAY   Cyanocobalamin 1000 MCG SUBL Place 1 tablet (1,000 mcg total) under the tongue once a week. (Patient taking differently: Place 1,000 mcg under the tongue 2 (two) times a week. Wednesdays &  Sundays)   Ferrous Sulfate (IRON PO) Take 1 tablet by mouth in the morning and at bedtime.   fluticasone (FLONASE) 50 MCG/ACT nasal spray SPRAY 2 SPRAYS INTO EACH NOSTRIL EVERY DAY   hydrALAZINE (APRESOLINE) 25 MG tablet TAKE 1 TABLET (25 MG) BY MOUTH IN THE MORNING AND AT BEDTIME   losartan (COZAAR) 100 MG tablet Take 100 mg by mouth daily.    miconazole (MICATIN) 2 % cream Apply 1 Application topically 2 (two) times daily.   Polyethylene Glycol 3350 POWD Take 17 g by mouth at bedtime.   potassium chloride SA (KLOR-CON M) 20 MEQ tablet Take 1 tablet (20 mEq total) by mouth daily.   sildenafil (VIAGRA) 50 MG tablet Take one half tab (25 mg) daily as needed 1 hour prior to sexual activity.   tadalafil (CIALIS) 20 MG tablet Take 20 mg by mouth as needed.   traMADol (ULTRAM) 50 MG tablet TAKE 2 TABLETS (100 MG TOTAL) BY MOUTH EVERY 12 (TWELVE) HOURS AS NEEDED.   No facility-administered encounter medications on file as of 02/18/2023.  Fill History:  Dispensed Days Supply Quantity Provider Pharmacy  ALLOPURINOL  300 MG TABS 02/17/2023 90 90 tablet      Dispensed Days Supply Quantity Provider Pharmacy  CELECOXIB  100 MG CAPS 01/22/2023 30 60 capsule      Dispensed Days Supply Quantity Provider Pharmacy  COLCHICINE 0.6 MG TABLET 04/06/2022 20 20 each      Dispensed Days Supply Quantity Provider Pharmacy  FLUTICASONE PROP 50 MCG SPRAY 10/21/2022 90 48 g      Dispensed Days Supply Quantity Provider Pharmacy  HYDRALAZINE HYDROCHLORIDE  25 MG TABS 02/13/2023 90 180 tablet      Dispensed Days Supply Quantity Provider Pharmacy  LOSARTAN POTASSIUM 100 MG TAB 12/08/2022 90 90 each      Dispensed Days Supply Quantity Provider Pharmacy  MICONAZOLE 2% TOPICAL CREAM 06/25/2022 14 14 g      Dispensed Days Supply Quantity Provider Pharmacy  KLOR-CON M20 TABLET 12/18/2022 3 3 each      Dispensed Days Supply Quantity Provider Pharmacy  TRAMADOL HYDROCHLORIDE  50 MG TABS 02/16/2023 30 120 tablet      Recent Office Vitals: BP Readings from Last 3 Encounters:  12/28/22 (!) 140/60  12/16/22 (!) 142/80  11/23/22 (!) 150/78   Pulse Readings from Last 3 Encounters:  12/28/22 65  12/16/22 64  11/23/22 64    Wt Readings from Last 3 Encounters:  12/28/22 169 lb 12.8 oz (77 kg)  12/16/22 168 lb (76.2 kg)  11/23/22 164 lb (74.4 kg)      Kidney Function Lab Results  Component Value Date/Time   CREATININE 1.12 12/16/2022 04:17 PM   CREATININE 1.05 06/24/2022 11:38 AM   CREATININE 1.83 (H) 05/30/2021 04:39 PM   CREATININE 1.20 01/13/2021 11:16 AM   CREATININE 1.66 (H) 07/19/2020 10:28 AM   GFR 62.71 12/16/2022 04:17 PM   GFRNONAA >60 02/14/2021 02:54 AM   GFRNONAA >60 01/13/2021 11:16 AM   GFRNONAA 39 (L) 07/19/2020 10:28 AM   GFRAA 46 (L) 07/19/2020 10:28 AM       Latest Ref Rng & Units 12/16/2022    4:17 PM 06/24/2022   11:38 AM 05/11/2022   11:14 AM  BMP  Glucose 70 - 99 mg/dL 161  81  71   BUN 6 - 23 mg/dL Creatinine 0.40 - 1.50 mg/dL 0.96  0.45  4.09   Sodium 135 - 145 mEq/L 136  135  128   Potassium 3.5 - 5.1 mEq/L 3.4  3.8  3.8   Chloride 96 - 112 mEq/L 98  97  93   CO2 19 - 32 mEq/L 29  30  29    Calcium 8.4 - 10.5 mg/dL 9.1  8.9  9.2    Inetta Fermo [facility]  Clinical Pharmacist Assistant [phone]

## 2023-03-24 ENCOUNTER — Other Ambulatory Visit: Payer: Self-pay | Admitting: Family Medicine

## 2023-03-24 DIAGNOSIS — I10 Essential (primary) hypertension: Secondary | ICD-10-CM

## 2023-03-24 DIAGNOSIS — M545 Low back pain, unspecified: Secondary | ICD-10-CM

## 2023-03-24 DIAGNOSIS — G8929 Other chronic pain: Secondary | ICD-10-CM

## 2023-03-27 ENCOUNTER — Other Ambulatory Visit: Payer: Self-pay | Admitting: Family Medicine

## 2023-03-27 DIAGNOSIS — I10 Essential (primary) hypertension: Secondary | ICD-10-CM

## 2023-04-02 DIAGNOSIS — N2581 Secondary hyperparathyroidism of renal origin: Secondary | ICD-10-CM | POA: Diagnosis not present

## 2023-04-02 DIAGNOSIS — D631 Anemia in chronic kidney disease: Secondary | ICD-10-CM | POA: Diagnosis not present

## 2023-04-02 DIAGNOSIS — I129 Hypertensive chronic kidney disease with stage 1 through stage 4 chronic kidney disease, or unspecified chronic kidney disease: Secondary | ICD-10-CM | POA: Diagnosis not present

## 2023-04-02 DIAGNOSIS — N182 Chronic kidney disease, stage 2 (mild): Secondary | ICD-10-CM | POA: Diagnosis not present

## 2023-04-02 DIAGNOSIS — E871 Hypo-osmolality and hyponatremia: Secondary | ICD-10-CM | POA: Diagnosis not present

## 2023-04-02 DIAGNOSIS — R809 Proteinuria, unspecified: Secondary | ICD-10-CM | POA: Diagnosis not present

## 2023-04-03 LAB — LAB REPORT - SCANNED
Albumin, Urine POC: 606.5
Albumin/Creatinine Ratio, Urine, POC: 869
Creatinine, POC: 69.8 mg/dL
EGFR: 59
HM Hepatitis Screen: NEGATIVE

## 2023-04-07 ENCOUNTER — Encounter: Payer: Self-pay | Admitting: Nephrology

## 2023-04-07 ENCOUNTER — Other Ambulatory Visit: Payer: Self-pay | Admitting: Nephrology

## 2023-04-07 DIAGNOSIS — N2581 Secondary hyperparathyroidism of renal origin: Secondary | ICD-10-CM

## 2023-04-07 DIAGNOSIS — N189 Chronic kidney disease, unspecified: Secondary | ICD-10-CM

## 2023-04-07 DIAGNOSIS — N182 Chronic kidney disease, stage 2 (mild): Secondary | ICD-10-CM

## 2023-04-07 DIAGNOSIS — E871 Hypo-osmolality and hyponatremia: Secondary | ICD-10-CM

## 2023-04-07 DIAGNOSIS — I129 Hypertensive chronic kidney disease with stage 1 through stage 4 chronic kidney disease, or unspecified chronic kidney disease: Secondary | ICD-10-CM

## 2023-04-07 DIAGNOSIS — R809 Proteinuria, unspecified: Secondary | ICD-10-CM

## 2023-04-07 NOTE — Progress Notes (Unsigned)
Care Management & Coordination Services Pharmacy Note  04/07/2023 Name:  Scott [name] Sr. MRN:  [id number] DOB:  07/28/1946  Summary: ***  Recommendations/Changes made from today's visit: ***  Follow up plan: ***   Subjective: Scott [name] Sr. is an 77 y.o. year old male who is a primary patient of Deeann Saint, MD.  The care coordination team was consulted for assistance with disease management and care coordination needs.    {CCMTELEPHONEFACETOFACE:21091510} for follow up visit.  Recent office visits: 12/28/2022 Abbe Amsterdam MD - Patient was seen for Right calf pain and essential hypertension. No medication changes.    12/16/2022 Abbe Amsterdam MD - Patient was seen for essential hypertension and an additional concern. Increased Hydralazine 25 tid.    11/23/2022 Abbe Amsterdam MD - Patient was seen for essential hypertension and an additional concern. Increased Hydralazine to 25 mg twice daily.    11/04/2022 Abbe Amsterdam MD - Patient was seen for Bilateral lower extremity pain and an additional concern. Losartan increased to 100 mg daily. Discontinued triamterene HCTZ.    10/05/2022 Abbe Amsterdam MD - Patient was seen for skin complaints. No medication changes.   Recent consult visits: 10/30/2022 Mickel Crow (dermatology) - Patient was seen for other seborrheic keratosis. No additional chart notes.   Hospital visits: None in previous 6 months   Objective:  Lab Results  Component Value Date   CREATININE 1.12 12/16/2022   BUN 12 12/16/2022   GFR 62.71 12/16/2022   GFRNONAA >60 02/14/2021   GFRAA 46 (L) 07/19/2020   NA 136 12/16/2022   K 3.4 (L) 12/16/2022   CALCIUM 9.1 12/16/2022   CO2 29 12/16/2022   GLUCOSE 105 (H) 12/16/2022    Lab Results  Component Value Date/Time   HGBA1C 5.9 04/24/2022 10:53 AM   HGBA1C 6.3 12/02/2020 04:28 PM   GFR 62.71 12/16/2022 04:17 PM   GFR 67.99 06/24/2022 11:38 AM    Last diabetic Eye exam: No results found for:  "HMDIABEYEEXA"  Last diabetic Foot exam: No results found for: "HMDIABFOOTEX"   Lab Results  Component Value Date   CHOL 196 04/24/2022   HDL 53.80 04/24/2022   LDLCALC 122 (H) 04/24/2022   LDLDIRECT 131.0 01/24/2016   TRIG 102.0 04/24/2022   CHOLHDL 4 04/24/2022       Latest Ref Rng & Units 05/11/2022   11:14 AM 04/24/2022   10:53 AM 02/06/2021    8:22 AM  Hepatic Function  Total Protein 6.0 - 8.3 g/dL 7.1  7.1  7.4   Albumin 3.5 - 5.2 g/dL 4.2  4.2  4.0   AST 0 - 37 U/L 24  29  29    ALT 0 - 53 U/L 12  17  21    Alk Phosphatase 39 - 117 U/L 117  132  134   Total Bilirubin 0.2 - 1.2 mg/dL 0.5  0.6  0.8     Lab Results  Component Value Date/Time   TSH 1.49 12/16/2022 04:17 PM   TSH 1.50 06/24/2022 11:38 AM   FREET4 0.84 04/24/2022 10:53 AM       Latest Ref Rng & Units 12/16/2022    4:17 PM 04/24/2022   10:53 AM 05/19/2021   11:53 AM  CBC  WBC 4.0 - 10.5 K/uL 6.5  4.2  6.9   Hemoglobin 13.0 - 17.0 g/dL 40.9  81.1  91.4   Hematocrit 39.0 - 52.0 % 35.7  35.1  30.9   Platelets 150.0 - 400.0 K/uL 191.0  224.0  257.0     Lab Results  Component Value Date/Time   VITAMINB12 587 01/13/2021 11:16 AM   VITAMINB12 827 04/24/2020 03:30 PM    Clinical ASCVD: No  The 10-year ASCVD risk score (Arnett DK, et al., 2019) is: 27.9%   Values used to calculate the score:     Age: 24 years     Sex: Male     Is Non-Hispanic African American: Yes     Diabetic: No     Tobacco smoker: No     Systolic Blood Pressure: 140 mmHg     Is BP treated: Yes     HDL Cholesterol: 53.8 mg/dL     Total Cholesterol: 196 mg/dL        1/61/0960   45:40 AM 10/05/2022   11:12 AM 08/03/2022   10:30 AM  Depression screen PHQ 2/9  Decreased Interest 0 0 0  Down, Depressed, Hopeless 0 0 0  PHQ - 2 Score 0 0 0  Altered sleeping 0 0 0  Tired, decreased energy 0 0 0  Change in appetite 0 0 0  Feeling bad or failure about yourself  0 0 0  Trouble concentrating 0 0 0  Moving slowly or fidgety/restless  0 0 0  Suicidal thoughts 0 0 0  PHQ-9 Score 0 0 0  Difficult doing work/chores   Not difficult at all     Social History   Tobacco Use  Smoking Status Never  Smokeless Tobacco Never   BP Readings from Last 3 Encounters:  12/28/22 (!) 140/60  12/16/22 (!) 142/80  11/23/22 (!) 150/78   Pulse Readings from Last 3 Encounters:  12/28/22 65  12/16/22 64  11/23/22 64   Wt Readings from Last 3 Encounters:  12/28/22 169 lb 12.8 oz (77 kg)  12/16/22 168 lb (76.2 kg)  11/23/22 164 lb (74.4 kg)   BMI Readings from Last 3 Encounters:  12/28/22 25.22 kg/m  12/16/22 24.95 kg/m  11/23/22 24.36 kg/m    Allergies  Allergen Reactions   Ferumoxytol Other (See Comments)    Difficulty breathing. Infusion reaction 01/24/21.   Nsaids Other (See Comments)     hx of renal insufficiency   Statins     myalgias    Medications Reviewed Today     Reviewed by Deeann Saint, MD (Physician) on 12/28/22 at 1442  Med List Status: <None>   Medication Order Taking? Sig Documenting Provider Last Dose Status Informant  acetaminophen (TYLENOL) 325 MG tablet [id number] Yes Take 1-2 tablets (325-650 mg total) by mouth every 6 (six) hours as needed for mild pain (pain score 1-3 or temp > 100.5). Darrick Grinder, PA-C Taking Active   allopurinol (ZYLOPRIM) 300 MG tablet [id number] Yes TAKE 1 TABLET BY MOUTH EVERY DAY Deeann Saint, MD Taking Active   Blood Pressure Monitoring (BLOOD PRESSURE CUFF) MISC [id number] Yes 1 Device by Does not apply route daily. Deeann Saint, MD Taking Active   celecoxib (CELEBREX) 100 MG capsule [id number] Yes TAKE 1 CAPSULE (100 MG TOTAL) BY MOUTH 2 (TWO) TIMES DAILY AS NEEDED FOR MODERATE PAIN (SHOULDER). Deeann Saint, MD Taking Active   colchicine 0.6 MG tablet [id number] Yes TAKE 1 TABLET EVERY DAY Deeann Saint, MD Taking Active   Cyanocobalamin 1000 MCG SUBL [id number] Yes Place 1 tablet (1,000 mcg total) under the tongue once a week.  Patient taking  differently: Place 1,000 mcg under the tongue 2 (two) times a week. Wednesdays & Sundays   Abbe Amsterdam  R, MD Taking Active   Ferrous Sulfate (IRON PO) [id number] Yes Take 1 tablet by mouth in the morning and at bedtime. [provider] Taking Active Self  fluticasone (FLONASE) 50 MCG/ACT nasal spray [id number] Yes SPRAY 2 SPRAYS INTO EACH NOSTRIL EVERY DAY Deeann Saint, MD Taking Active   hydrALAZINE (APRESOLINE) 25 MG tablet [id number] Yes Take 1 tablet (25 mg total) by mouth 3 (three) times daily. Deeann Saint, MD Taking Active   losartan (COZAAR) 100 MG tablet [id number] Yes Take 100 mg by mouth daily. [provider] Taking Active   miconazole (MICATIN) 2 % cream [id number] Yes Apply 1 Application topically 2 (two) times daily. Deeann Saint, MD Taking Active   Polyethylene Glycol 3350 POWD [id number] Yes Take 17 g by mouth at bedtime. [provider] Taking Active Self  potassium chloride SA (KLOR-CON M) 20 MEQ tablet [id number] Yes Take 1 tablet (20 mEq total) by mouth daily. Deeann Saint, MD Taking Active   sildenafil (VIAGRA) 50 MG tablet [id number] Yes Take one half tab (25 mg) daily as needed 1 hour prior to sexual activity. Deeann Saint, MD Taking Active   tadalafil (CIALIS) 20 MG tablet [id number] Yes Take 20 mg by mouth as needed. [provider] Taking Active   traMADol (ULTRAM) 50 MG tablet [id number] Yes TAKE 2 TABLETS (100 MG TOTAL) BY MOUTH EVERY 12 (TWELVE) HOURS AS NEEDED. Deeann Saint, MD Taking Active             SDOH:  (Social Determinants of Health) assessments and interventions performed: Yes SDOH Interventions    Flowsheet Row Chronic Care Management from 10/02/2022 in [facility] [place] Clinical Support from 08/03/2022 in [facility] [place] Chronic Care Management from 10/07/2021 in [facility] [place] Clinical Support from 06/23/2021 in [facility] [place] Clinical Support from 06/24/2020 in [facility] [place]  SDOH Interventions       Food Insecurity Interventions -- Intervention Not Indicated -- Intervention Not Indicated Intervention Not Indicated  Housing Interventions -- Intervention Not Indicated -- Intervention Not Indicated Intervention Not Indicated  Transportation Interventions -- Intervention Not Indicated Intervention Not Indicated Intervention Not Indicated Intervention Not Indicated  Utilities Interventions -- Intervention Not Indicated -- -- --  Alcohol Usage Interventions -- Intervention Not Indicated (Score <7) -- -- --  Depression Interventions/Treatment  -- -- -- -- PHQ2-9 Score <4 Follow-up Not Indicated  Financial Strain Interventions Intervention Not Indicated Intervention Not Indicated Intervention Not Indicated Intervention Not Indicated Intervention Not Indicated  Physical Activity Interventions -- Intervention Not Indicated -- Intervention Not Indicated Other (Comments)  [Patient states that he is really active around his home doing yard work and playing with his grandchildren]  Stress Interventions -- Intervention Not Indicated -- Intervention Not Indicated Intervention Not Indicated  Social Connections Interventions -- Intervention Not Indicated -- Intervention Not Indicated --       Medication Assistance: None required.  Patient affirms current coverage meets needs.  Medication Access: Name and location of current pharmacy:  [id number] [place], [place] - [address] [facility] [address] [place] [address] Phone: [phone] Fax: [phone]  Within the past 30 days, how often has patient missed a dose of medication? *** Is a pillbox or other method used to improve adherence? {YES/NO:21197} Factors that may affect medication adherence? {CHL DESC; BARRIERS:21522} Are meds synced by  current  pharmacy? {YES/NO:21197} Are meds delivered by current pharmacy? {YES/NO:21197} Does patient experience delays in picking up medications due to transportation concerns? {YES/NO:21197}  Compliance/Adherence/Medication fill history: Care Gaps: AWV - completed 08/03/2022, scheduled 08/06/2023 Last BP - 140/60 on 12/28/2022 Last A1C - 5.9 on 04/24/2022 Shingrix - never done Tdap - overdue Covid - overdue  Star-Rating Drugs: Losartan 100mg  PDC 97% LF 03/11/23 90DS   Assessment/Plan Hypertension (BP goal <140/90) -{US controlled/uncontrolled:25276} -Current treatment: Losartan 100mg  Hydralazine 25mg  -Medications previously tried: Triamterene/HCTZ  -Current home readings: *** -Current dietary habits: *** -Current exercise habits: *** -{ACTIONS;DENIES/REPORTS:21021675::"Denies"} hypotensive/hypertensive symptoms -Educated on {CCM BP Counseling:25124} -Counseled to monitor BP at home ***, document, and provide log at future appointments -{CCMPHARMDINTERVENTION:25122}  Hyperlipidemia: (LDL goal < 100) -Uncontrolled -Current treatment: None -Medications previously tried: Atorvastatin  -Current dietary patterns: *** -Current exercise habits: *** -Educated on {CCM HLD Counseling:25126} -{CCMPHARMDINTERVENTION:25122}   Sherrill Raring Clinical Pharmacist [phone]

## 2023-04-09 ENCOUNTER — Telehealth: Payer: Self-pay

## 2023-04-09 NOTE — Progress Notes (Signed)
Care Management & Coordination Services Pharmacy Team  Reason for Encounter: Appointment Reminder  Contacted patient to confirm telephone appointment with Delano Metz, PharmD on 04/12/2023 at 1:00. Unsuccessful outreach. Left voicemail for patient to return call.  Care Gaps: AWV - completed 08/03/2022 Last BP - 140/60 on 12/28/2022 Last A1C - 5.9 on 04/24/2022 Shingrix - never done Tdap - overdue Covid - overdue   Star Rating Drug: Losartan 100 mg - [facility]  Inetta [facility]  Clinical Pharmacist Assistant [phone]

## 2023-05-03 ENCOUNTER — Ambulatory Visit (INDEPENDENT_AMBULATORY_CARE_PROVIDER_SITE_OTHER): Payer: Medicare Other

## 2023-05-03 ENCOUNTER — Encounter: Payer: Self-pay | Admitting: Family Medicine

## 2023-05-03 ENCOUNTER — Ambulatory Visit (INDEPENDENT_AMBULATORY_CARE_PROVIDER_SITE_OTHER): Payer: Medicare Other | Admitting: Family Medicine

## 2023-05-03 VITALS — BP 128/76 | HR 49 | Temp 97.9°F | Resp 17 | Ht 68.8 in | Wt 166.1 lb

## 2023-05-03 DIAGNOSIS — M25562 Pain in left knee: Secondary | ICD-10-CM

## 2023-05-03 DIAGNOSIS — Z96652 Presence of left artificial knee joint: Secondary | ICD-10-CM | POA: Diagnosis not present

## 2023-05-03 DIAGNOSIS — S0081XA Abrasion of other part of head, initial encounter: Secondary | ICD-10-CM

## 2023-05-03 DIAGNOSIS — G44319 Acute post-traumatic headache, not intractable: Secondary | ICD-10-CM | POA: Diagnosis not present

## 2023-05-03 DIAGNOSIS — M1811 Unilateral primary osteoarthritis of first carpometacarpal joint, right hand: Secondary | ICD-10-CM | POA: Diagnosis not present

## 2023-05-03 DIAGNOSIS — S060X0A Concussion without loss of consciousness, initial encounter: Secondary | ICD-10-CM

## 2023-05-03 DIAGNOSIS — W109XXA Fall (on) (from) unspecified stairs and steps, initial encounter: Secondary | ICD-10-CM

## 2023-05-03 DIAGNOSIS — M20001 Unspecified deformity of right finger(s): Secondary | ICD-10-CM

## 2023-05-03 DIAGNOSIS — M7989 Other specified soft tissue disorders: Secondary | ICD-10-CM | POA: Diagnosis not present

## 2023-05-03 NOTE — Patient Instructions (Signed)
Orders for CT head and CT orbits were ordered.  You should get a phone call about scheduling these appointments.  I will also place orders for x-ray for your hand and your knee which can be done while you are here in clinic.  Is advised that you work to stop using Celebrex as it can increase bleeding risk.

## 2023-05-03 NOTE — Progress Notes (Signed)
Established Patient Office Visit   Subjective  Patient ID: Scott [name]., male    DOB: January 04, 1946  Age: 77 y.o. MRN: [id number]  Chief Complaint  Patient presents with   Headache    Pt states he fell on Saturday 05/01/23 Hit his head on left side has a place on upper left cheek that is red Denies any thing hurting today  States he did have a headache yesterday     Patient is a 78 male seen for acute concern.  Appointment was made by patient's daughter after he suffered a fall 2 days ago and was complaining of headache.  Patient states he fell on Saturday after stepping up on the back step at his home with his left leg.  Patient has a history of discomfort and popping sound in left knee s/p left TKR.  Patient states he fell on his left side hitting his head on the ground.  Patient is not on aspirin or other blood thinner.  No LOC.  Was able to get up with help from a friend who was present at time of fall.  Had bleeding right cheek due to abrasion.  Patient denies headache, dizziness.  Pt with  R 2nd digit edema at PIP jt and inability to flex finger x 6 months.  Denies injury or pain.   Past Medical History:  Diagnosis Date   ANEMIA, OTHER UNSPEC 08/02/2009   Arthritis    B12 DEFICIENCY 07/07/2007   Bell's palsy     around 2005   BPPV (benign paroxysmal positional vertigo)    2003   Cataract    EKG abnormalities    GERD 07/13/2007   Gout, unspecified 06/17/2010   HYPERLIPIDEMIA 07/13/2007   HYPERTENSION 07/13/2007   RENAL INSUFFICIENCY 06/04/2010   Past Surgical History:  Procedure Laterality Date   BACK SURGERY  07/13/07   CARPAL TUNNEL RELEASE Bilateral    CATARACT EXTRACTION, BILATERAL Bilateral    KNEE ARTHROPLASTY Left 02/13/2021   Procedure: COMPUTER ASSISTED TOTAL KNEE ARTHROPLASTY;  Surgeon: Samson Frederic, MD;  Location: [facility];  Service: Orthopedics;  Laterality: Left;   Social History   Tobacco Use   Smoking status: Never   Smokeless tobacco: Never  Vaping Use    Vaping Use: Never used  Substance Use Topics   Alcohol use: No   Drug use: No   Family History  Problem Relation Age of Onset   Arthritis Father    Allergies  Allergen Reactions   Ferumoxytol Other (See Comments)    Difficulty breathing. Infusion reaction 01/24/21.   Nsaids Other (See Comments)     hx of renal insufficiency   Statins     myalgias      ROS Negative unless stated above    Objective:     BP 128/76   Pulse (!) 49   Temp 97.9 F (36.6 C) (Oral)   Resp 17   Ht 5' 8.8" (1.748 m)   Wt 166 lb 2 oz (75.4 kg)   SpO2 97%   BMI 24.68 kg/m    Physical Exam Vitals reviewed.  Constitutional:      Appearance: He is well-developed.  HENT:     Head: Normocephalic.     Comments: Wearing glasses.  Hemostatic abrasion at left upper cheek/inferior to temple.  No periorbital TTP.   Eyes:     Extraocular Movements: Extraocular movements intact.     Pupils: Pupils are equal, round, and reactive to light.  Cardiovascular:  Rate and Rhythm: Normal rate and regular rhythm.  Pulmonary:     Effort: Pulmonary effort is normal.     Breath sounds: Normal breath sounds.  Abdominal:     Palpations: Abdomen is soft.  Musculoskeletal:     Comments: Right second digit extended with edema and enlargement of PIP joint.  Unable to flex right second digit at PIP joint. Decreased ROM of bilateral shoulders left> right.  Tenderness and mild edema of left knee.  Skin:    General: Skin is warm and dry.     Comments: Abrasion of L cheek.  Neurological:     Mental Status: He is alert and oriented to person, place, and time. Mental status is at baseline.      No results found for any visits on 05/03/23.    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  Though exam reassuring, will obtain imaging to rule out acute bleed or orbit fracture.  Obtain x-rays of right hand and left knee to further evaluate continued knee pain s/p TKR and decreased mobility of right second digit.  Discussed possible causes of finger deformity including RA, OA, gout.  Discussed supportive care.  Advised patient to limit use of Celebrex with plans to stop medication due to increased risk of GIB and CV risks.  Given strict precautions.  Return if symptoms worsen or fail to improve.   Deeann Saint, MD

## 2023-05-04 ENCOUNTER — Other Ambulatory Visit: Payer: Self-pay

## 2023-05-13 ENCOUNTER — Other Ambulatory Visit: Payer: Self-pay | Admitting: Family Medicine

## 2023-05-13 DIAGNOSIS — I10 Essential (primary) hypertension: Secondary | ICD-10-CM

## 2023-05-19 ENCOUNTER — Ambulatory Visit: Payer: Medicare Other

## 2023-06-14 ENCOUNTER — Other Ambulatory Visit: Payer: Self-pay | Admitting: Family Medicine

## 2023-06-14 DIAGNOSIS — G8929 Other chronic pain: Secondary | ICD-10-CM

## 2023-06-14 DIAGNOSIS — M545 Low back pain, unspecified: Secondary | ICD-10-CM

## 2023-06-24 ENCOUNTER — Other Ambulatory Visit: Payer: Self-pay | Admitting: Family Medicine

## 2023-06-24 DIAGNOSIS — I10 Essential (primary) hypertension: Secondary | ICD-10-CM

## 2023-06-24 DIAGNOSIS — J309 Allergic rhinitis, unspecified: Secondary | ICD-10-CM

## 2023-07-13 ENCOUNTER — Other Ambulatory Visit: Payer: Self-pay | Admitting: Family Medicine

## 2023-08-22 ENCOUNTER — Other Ambulatory Visit: Payer: Self-pay | Admitting: Family Medicine

## 2023-08-31 ENCOUNTER — Telehealth: Payer: Self-pay

## 2023-08-31 NOTE — Telephone Encounter (Signed)
Immunization input.

## 2023-09-16 DIAGNOSIS — I129 Hypertensive chronic kidney disease with stage 1 through stage 4 chronic kidney disease, or unspecified chronic kidney disease: Secondary | ICD-10-CM | POA: Diagnosis not present

## 2023-09-16 DIAGNOSIS — D631 Anemia in chronic kidney disease: Secondary | ICD-10-CM | POA: Diagnosis not present

## 2023-09-16 DIAGNOSIS — E871 Hypo-osmolality and hyponatremia: Secondary | ICD-10-CM | POA: Diagnosis not present

## 2023-09-16 DIAGNOSIS — N182 Chronic kidney disease, stage 2 (mild): Secondary | ICD-10-CM | POA: Diagnosis not present

## 2023-09-16 DIAGNOSIS — R809 Proteinuria, unspecified: Secondary | ICD-10-CM | POA: Diagnosis not present

## 2023-09-16 DIAGNOSIS — N2581 Secondary hyperparathyroidism of renal origin: Secondary | ICD-10-CM | POA: Diagnosis not present

## 2023-09-16 DIAGNOSIS — N189 Chronic kidney disease, unspecified: Secondary | ICD-10-CM | POA: Diagnosis not present

## 2023-09-17 LAB — LAB REPORT - SCANNED
Albumin, Urine POC: 315.9
Albumin/Creatinine Ratio, Urine, POC: 526

## 2023-09-21 ENCOUNTER — Other Ambulatory Visit: Payer: Self-pay | Admitting: Family Medicine

## 2023-09-21 DIAGNOSIS — I10 Essential (primary) hypertension: Secondary | ICD-10-CM

## 2023-09-27 ENCOUNTER — Other Ambulatory Visit: Payer: Self-pay | Admitting: Family Medicine

## 2023-09-27 DIAGNOSIS — G8929 Other chronic pain: Secondary | ICD-10-CM

## 2023-09-27 DIAGNOSIS — M545 Low back pain, unspecified: Secondary | ICD-10-CM

## 2023-11-10 ENCOUNTER — Ambulatory Visit (INDEPENDENT_AMBULATORY_CARE_PROVIDER_SITE_OTHER): Payer: Medicare Other | Admitting: Family Medicine

## 2023-11-10 ENCOUNTER — Encounter: Payer: Self-pay | Admitting: Family Medicine

## 2023-11-10 VITALS — BP 130/70 | HR 65 | Temp 98.0°F | Ht 68.0 in | Wt 172.0 lb

## 2023-11-10 DIAGNOSIS — M25511 Pain in right shoulder: Secondary | ICD-10-CM | POA: Diagnosis not present

## 2023-11-10 DIAGNOSIS — G8929 Other chronic pain: Secondary | ICD-10-CM | POA: Diagnosis not present

## 2023-11-10 DIAGNOSIS — M19011 Primary osteoarthritis, right shoulder: Secondary | ICD-10-CM | POA: Diagnosis not present

## 2023-11-10 DIAGNOSIS — R2689 Other abnormalities of gait and mobility: Secondary | ICD-10-CM

## 2023-11-10 NOTE — Progress Notes (Signed)
 Established Patient Office Visit   Subjective  Patient ID: Scott Butcher., male    DOB: [birth year]  Age: 78 y.o. MRN: [id number]  Chief Complaint  Patient presents with   Shoulder Pain    Right shoulder pain started 3 weeks ago, hurts to move, patient can't raise his arm, rate of pain 4 out of 10,     Patient is a 78 year old male seen for follow-up on chronic condition.  Patient mentions right shoulder with increased pain x 3 weeks.  Unable to lift R arm overhead or reach behind.  Patient started using Voltaren gel after remembering he had OA in the shoulder from prior imaging.  Plan to cancel the appointment but did not want to be charged for late cancellation.  In the past patient was seen by Ortho.  Would like to avoid surgery.  Taking tramadol  as needed.  Patient states he typically takes tramadol  50 mg twice daily instead of 100 mg along with an OTC NSAID.  Patient mentions history of several falls while out in his yard.  Patient with history of left TKR.  May become off balance if steps with left leg first.  Has 1 step into back of the house without railings.    Patient Active Problem List   Diagnosis Date Noted   Degenerative arthritis of left knee 02/13/2021   Osteoarthritis of left knee 12/03/2020   Erectile dysfunction 04/24/2020   S/P carpal tunnel release 04/24/2020   Low back pain 10/22/2014   Rotator cuff tendinitis 06/04/2014   Gout 06/17/2010   CKD (chronic kidney disease) stage 3, GFR 30-59 ml/min (HCC) 06/04/2010   Anemia 08/02/2009   Hyperlipidemia 07/13/2007   Essential hypertension 07/13/2007   GERD 07/13/2007   B12 deficiency 07/07/2007   Past Medical History:  Diagnosis Date   ANEMIA, OTHER UNSPEC 08/02/2009   Arthritis    B12 DEFICIENCY 07/07/2007   Bell's palsy     around 2005   BPPV (benign paroxysmal positional vertigo)    2003   Cataract    EKG abnormalities    GERD 07/13/2007   Gout, unspecified 06/17/2010   HYPERLIPIDEMIA 07/13/2007    HYPERTENSION 07/13/2007   RENAL INSUFFICIENCY 06/04/2010   Past Surgical History:  Procedure Laterality Date   BACK SURGERY  07/13/07   CARPAL TUNNEL RELEASE Bilateral    CATARACT EXTRACTION, BILATERAL Bilateral    KNEE ARTHROPLASTY Left 02/13/2021   Procedure: COMPUTER ASSISTED TOTAL KNEE ARTHROPLASTY;  Surgeon: Fidel Rogue, MD;  Location: [facility];  Service: Orthopedics;  Laterality: Left;   Social History   Tobacco Use   Smoking status: Never   Smokeless tobacco: Never  Vaping Use   Vaping status: Never Used  Substance Use Topics   Alcohol  use: No   Drug use: No   Family History  Problem Relation Age of Onset   Arthritis Father    Allergies  Allergen Reactions   Ferumoxytol  Other (See Comments)    Difficulty breathing. Infusion reaction 01/24/21.   Nsaids Other (See Comments)     hx of renal insufficiency   Statins     myalgias      ROS Negative unless stated above    Objective:     BP 130/70 (BP Location: Left Arm, Patient Position: Sitting, Cuff Size: Normal)   Pulse 65   Temp 98 F (36.7 C) (Oral)   Ht 5' 8 (1.727 m)   Wt 172 lb (78 kg)   SpO2 96%   BMI 26.15 kg/m  BP Readings from Last 3 Encounters:  11/10/23 130/70  05/03/23 128/76  12/28/22 (!) 140/60   Wt Readings from Last 3 Encounters:  11/10/23 172 lb (78 kg)  05/03/23 166 lb 2 oz (75.4 kg)  12/28/22 169 lb 12.8 oz (77 kg)      Physical Exam Constitutional:      Appearance: Normal appearance.  HENT:     Head: Normocephalic and atraumatic.     Mouth/Throat:     Mouth: Mucous membranes are moist.  Eyes:     Extraocular Movements: Extraocular movements intact.  Cardiovascular:     Rate and Rhythm: Normal rate.  Pulmonary:     Effort: Pulmonary effort is normal.  Musculoskeletal:     Right shoulder: Bony tenderness present. Decreased range of motion.     Left shoulder: Decreased range of motion.     Comments: Decreased active ROM, 85 degrees abduction right arm.  Skin:     General: Skin is warm and dry.  Neurological:     Mental Status: He is alert and oriented to person, place, and time. Mental status is at baseline.      No results found for any visits on 11/10/23.    Assessment & Plan:  Chronic right shoulder pain -     Ambulatory referral to Physical Therapy  Primary osteoarthritis of right shoulder -     Ambulatory referral to Physical Therapy  Balance problem -     Ambulatory referral to Physical Therapy  Patient with ongoing right shoulder pain due to severe osteoarthritis.  Per chart review CT right shoulder without contrast 05/22/2022: severe OA of right glenohumeral joint with remodeling of the glenoid and fragmentation of the posterior margin of the glenoid.  Overall loss of glenoid bone stock.  Chronic supraspinatus and infraspinatus tendon tears.  Moderate-severe atrophy of supraspinatus, infraspinatus, and teres minor muscles.  Patient advised to continue OTC Voltaren gel, heat, stretching and other supportive care.  Also has tramadol  as needed.  Patient advised for continued or worsening symptoms to follow-up with Ortho.  Referral placed to physical therapy.  No follow-ups on file.   Clotilda JONELLE Single, MD

## 2023-11-16 ENCOUNTER — Ambulatory Visit (INDEPENDENT_AMBULATORY_CARE_PROVIDER_SITE_OTHER): Payer: Medicare Other | Admitting: Family Medicine

## 2023-11-16 DIAGNOSIS — Z0000 Encounter for general adult medical examination without abnormal findings: Secondary | ICD-10-CM

## 2023-11-16 NOTE — Progress Notes (Signed)
 PATIENT CHECK-IN and HEALTH RISK ASSESSMENT QUESTIONNAIRE:  -completed by phone/video for upcoming Medicare Preventive Visit  Pre-Visit Check-in: 1)Vitals (height, wt, BP, etc) - record in vitals section for visit on day of visit Request home vitals (wt, BP, etc.) and enter into vitals, THEN update Vital Signs SmartPhrase below at the top of the HPI. See below.  2)Review and Update Medications, Allergies PMH, Surgeries, Social history in Epic 3)Hospitalizations in the last year with date/reason? No   4)Review and Update Care Team (patient's specialists) in Epic 5) Complete PHQ9 in Epic  6) Complete Fall Screening in Epic 7)Review all Health Maintenance Due and order under PCP if not done.  Medicare Wellness Patient Questionnaire:  Answer theses question about your habits: How often do you have a drink containing alcohol ? no Have you ever smoked?no On average, how many days per week do you engage in moderate to strenuous exercise (like a brisk walk)? Daily - plays pool daily On average, how many minutes do you engage in exercise at this level? Every day plays pool, also start mowing and yard work most of the year starting in February, uses weed eater Diet: eats lots of veggies, chicken, fish, likes sweets   Beverages: usually drinks juice, water , doesn't drink as much soda  Answer theses question about your everyday activities: Can you perform most household chores? yes Are you deaf or have significant trouble hearing? no Do you feel that you have a problem with memory? no Do you feel safe at home?yes Last dentist visit? yearly 8. Do you have any difficulty performing your everyday activities? no Are you having any difficulty walking, taking medications on your own, and or difficulty managing daily home needs?no Do you have difficulty walking or climbing stairs?no Do you have difficulty dressing or bathing?no Do you have difficulty doing errands alone such as visiting a doctor's  office or shopping?no Do you currently have any difficulty preparing food and eating?no Do you currently have any difficulty using the toilet?no Do you have any difficulty managing your finances?no Do you have any difficulties with housekeeping of managing your housekeeping?no   Do you have Advanced Directives in place (Living Will, Healthcare Power or Attorney)? Has the paperwork - plans to do with his daughter   Last eye Exam and location? Making appointment now to go. Went last year.    Do you currently use prescribed or non-prescribed narcotic or opioid pain medications? Tramadol  takes only 1 twice daily     ----------------------------------------------------------------------------------------------------------------------------------------------------------------------------------------------------------------------  Because this visit was a virtual/telehealth visit, some criteria may be missing or patient reported. Any vitals not documented were not able to be obtained and vitals that have been documented are patient reported.    MEDICARE ANNUAL PREVENTIVE CARE VISIT WITH PROVIDER (Welcome to Medicare, initial annual wellness or annual wellness exam)  Virtual Visit via Phone Note  I connected with Scott GEHRIG Sr. on 11/16/23  by phone and verified that I am speaking with the correct person using two identifiers. Patient preferred phone over video.   Location patient: home Location provider:work or home office Persons participating in the virtual visit: patient, provider  Concerns and/or follow up today: doing good. Shoulder is improving.    See HM section in Epic for other details of completed HM.    ROS: negative for report of fevers, unintentional weight loss, vision changes, vision loss, hearing loss or change, chest pain, sob, hemoptysis, melena, hematochezia, hematuria, falls, bleeding or bruising, thoughts of suicide or self harm, memory  loss  Patient-completed extensive health risk assessment - reviewed and discussed with the patient: See Health Risk Assessment completed with patient prior to the visit either above or in recent phone note. This was reviewed in detailed with the patient today and appropriate recommendations, orders and referrals were placed as needed per Summary below and patient instructions.   Review of Medical History: -PMH, PSH, Family History and current specialty and care providers reviewed and updated and listed below   Patient Care Team: Scott Clotilda SAUNDERS, MD as PCP - General (Family Medicine) Scott [name], [facility] (Inactive) as Pharmacist (Pharmacist)   Past Medical History:  Diagnosis Date   ANEMIA, OTHER UNSPEC 08/02/2009   Arthritis    B12 DEFICIENCY 07/07/2007   Bell's palsy     around 2005   BPPV (benign paroxysmal positional vertigo)    2003   Cataract    EKG abnormalities    GERD 07/13/2007   Gout, unspecified 06/17/2010   HYPERLIPIDEMIA 07/13/2007   HYPERTENSION 07/13/2007   RENAL INSUFFICIENCY 06/04/2010    Past Surgical History:  Procedure Laterality Date   BACK SURGERY  07/13/07   CARPAL TUNNEL RELEASE Bilateral    CATARACT EXTRACTION, BILATERAL Bilateral    KNEE ARTHROPLASTY Left 02/13/2021   Procedure: COMPUTER ASSISTED TOTAL KNEE ARTHROPLASTY;  Surgeon: Fidel Rogue, MD;  Location: [facility];  Service: Orthopedics;  Laterality: Left;    Social History   Socioeconomic History   Marital status: Married    Spouse name: MaryAnn   Number of children: 4   Years of education: Not on file   Highest education level: Some college, no degree  Occupational History    Employer: RETIRED  Tobacco Use   Smoking status: Never   Smokeless tobacco: Never  Vaping Use   Vaping status: Never Used  Substance and Sexual Activity   Alcohol  use: No   Drug use: No   Sexual activity: Not on file  Other Topics Concern   Not on file  Social History Narrative   Married 1965. 4 children. 11  grandchildren. 1 greatgrandchild on way in 2015.       Retired from [employer]      Hobbies: [employer], sports      Patient is right-handed. He is married, lives with his wife. He states he stops every morning and gets a 52oz tea. He walks most days.   Social Drivers of Corporate Investment Banker Strain: Low Risk  (10/02/2022)   Overall Financial Resource Strain (CARDIA)    Difficulty of Paying Living Expenses: Not very hard  Food Insecurity: No Food Insecurity (08/03/2022)   Hunger Vital Sign    Worried About Running Out of Food in the Last Year: Never true    Ran Out of Food in the Last Year: Never true  Transportation Needs: No Transportation Needs (08/03/2022)   PRAPARE - Administrator, Civil Service (Medical): No    Lack of Transportation (Non-Medical): No  Physical Activity: Inactive (08/03/2022)   Exercise Vital Sign    Days of Exercise per Week: 0 days    Minutes of Exercise per Session: 0 min  Stress: No Stress Concern Present (08/03/2022)   [employer] of Occupational Health - Occupational Stress Questionnaire    Feeling of Stress : Not at all  Social Connections: Moderately Isolated (08/03/2022)   Social Connection and Isolation Panel [NHANES]    Frequency of Communication with Friends and Family: More than three times a week    Frequency  of Social Gatherings with Friends and Family: More than three times a week    Attends Religious Services: Never    Database Administrator or Organizations: No    Attends Banker Meetings: Never    Marital Status: Married  Catering Manager Violence: Not At Risk (08/03/2022)   Humiliation, Afraid, Rape, and Kick questionnaire    Fear of Current or Ex-Partner: No    Emotionally Abused: No    Physically Abused: No    Sexually Abused: No    Family History  Problem Relation Age of Onset   Arthritis Father     Current Outpatient Medications on File Prior to Visit  Medication Sig Dispense Refill    acetaminophen  (TYLENOL ) 325 MG tablet Take 1-2 tablets (325-650 mg total) by mouth every 6 (six) hours as needed for mild pain (pain score 1-3 or temp > 100.5). 100 tablet 0   allopurinol  (ZYLOPRIM ) 300 MG tablet TAKE 1 TABLET BY MOUTH EVERY DAY 90 tablet 3   Blood Pressure Monitoring (BLOOD PRESSURE CUFF) MISC 1 Device by Does not apply route daily. 1 each 0   celecoxib  (CELEBREX ) 100 MG capsule TAKE 1 CAPSULE (100 MG TOTAL) BY MOUTH 2 (TWO) TIMES DAILY AS NEEDED FOR MODERATE PAIN (SHOULDER). 60 capsule 3   colchicine  0.6 MG tablet TAKE 1 TABLET DAILY 20 tablet 4   Cyanocobalamin  1000 MCG SUBL Place 1 tablet (1,000 mcg total) under the tongue once a week. (Patient taking differently: Place 1,000 mcg under the tongue 2 (two) times a week. Wednesdays & Sundays) 4 each 4   Ferrous Sulfate (IRON PO) Take 1 tablet by mouth in the morning and at bedtime.     fluticasone  (FLONASE ) 50 MCG/ACT nasal spray SPRAY 2 SPRAYS INTO EACH NOSTRIL EVERY DAY 48 mL 1   hydrALAZINE  (APRESOLINE ) 25 MG tablet TAKE 1 TABLET BY MOUTH IN THE MORNING AND AT BEDTIME 180 tablet 0   losartan  (COZAAR ) 100 MG tablet Take 100 mg by mouth daily.     miconazole  (MICATIN) 2 % cream Apply 1 Application topically 2 (two) times daily. 14 g 0   Polyethylene Glycol 3350  POWD Take 17 g by mouth at bedtime.     potassium chloride  SA (KLOR-CON  M) 20 MEQ tablet Take 1 tablet (20 mEq total) by mouth daily. 3 tablet 0   sildenafil  (VIAGRA ) 50 MG tablet Take one half tab (25 mg) daily as needed 1 hour prior to sexual activity. 10 tablet 0   tadalafil (CIALIS) 20 MG tablet Take 20 mg by mouth as needed.     traMADol  (ULTRAM ) 50 MG tablet Take 2 tablets (100 mg total) by mouth every 12 (twelve) hours as needed. 120 tablet 1   No current facility-administered medications on file prior to visit.    Allergies  Allergen Reactions   Ferumoxytol  Other (See Comments)    Difficulty breathing. Infusion reaction 01/24/21.   Nsaids Other (See Comments)      hx of renal insufficiency   Statins     myalgias       Physical Exam Vitals requested from patient and listed below if patient had equipment and was able to obtain at home for this virtual visit: There were no vitals filed for this visit. Estimated body mass index is 26.15 kg/m as calculated from the following:   Height as of 11/10/23: 5' 8 (1.727 m).   Weight as of 11/10/23: 172 lb (78 kg).  EKG (optional): deferred due to virtual visit  GENERAL:  alert, oriented, no acute distress detected; full vision exam deferred due to pandemic and/or virtual encounter   PSYCH/NEURO: pleasant and cooperative, no obvious depression or anxiety, speech and thought processing grossly intact, Cognitive function grossly intact  Flowsheet Row Office Visit from 11/10/2023 in [facility] [place]  PHQ-9 Total Score 0           11/16/2023   11:06 AM 11/10/2023    4:21 PM 05/03/2023    8:14 AM 11/23/2022   11:23 AM 10/05/2022   11:12 AM  Depression screen PHQ 2/9  Decreased Interest 0 0 0 0 0  Down, Depressed, Hopeless 0 0 0 0 0  PHQ - 2 Score 0 0 0 0 0  Altered sleeping  0 0 0 0  Tired, decreased energy  0 0 0 0  Change in appetite  0 0 0 0  Feeling bad or failure about yourself   0 0 0 0  Trouble concentrating  0 0 0 0  Moving slowly or fidgety/restless  0 0 0 0  Suicidal thoughts  0 0 0 0  PHQ-9 Score  0 0 0 0  Difficult doing work/chores   Not difficult at all         10/05/2022   11:13 AM 11/23/2022   11:23 AM 05/03/2023    8:14 AM 11/10/2023    4:21 PM 11/16/2023   11:06 AM  Fall Risk  Falls in the past year? 0 1 1 0 1  Was there an injury with Fall? 0 0 1 0 0  Fall Risk Category Calculator 0 2 2 0 1  Fall Risk Category (Retired) Low      (RETIRED) Patient Fall Risk Level Low fall risk      Patient at Risk for Falls Due to No Fall Risks Other (Comment) History of fall(s)  History of fall(s)  Fall risk Follow up Falls evaluation completed Falls evaluation  completed Falls evaluation completed Falls evaluation completed Education provided;Falls evaluation completed;Falls prevention discussed     SUMMARY AND PLAN:  Encounter for Medicare annual wellness exam   Discussed applicable health maintenance/preventive health measures and advised and referred or ordered per patient preferences:  Health Maintenance  Topic Date Due   INFLUENZA VACCINE  01/31/2024 (Originally 06/03/2023)   Medicare Annual Wellness (AWV)  11/15/2024   Pneumonia Vaccine 52+ Years old  Completed   Hepatitis C Screening  Completed   HPV VACCINES  Aged Out   DTaP/Tdap/Td  Discontinued   Colonoscopy  Discontinued   COVID-19 Vaccine  Discontinued   Zoster Vaccines- Shingrix  Discontinued     Education and counseling on the following was provided based on the above review of health and a plan/checklist for the patient, along with additional information discussed, was provided for the patient in the patient instructions :  -Advised on importance of completing advanced directives, discussed options for completing and provided information in patient instructions as well -Provided counseling and plan for difficulty hearing  -Provided counseling and plan for increased risk of falling if applicable per above screening. Reviewed and discussed safe balance exercises that can be done at home to improve balance and discussed exercise guidelines for adults with include balance exercises at least 3 days per week.  -Advised and counseled on a healthy lifestyle - including the importance of a healthy diet, regular physical activity, social connections and stress management. -Reviewed patient's current diet. Advised and counseled on a whole foods based healthy diet. A summary of  a healthy diet was provided in the Patient Instructions. Encouraged to cut down on sweets and processed grains. -reviewed patient's current physical activity level and discussed exercise guidelines for adults.  Discussed community resources and ideas for safe exercise at home to assist in meeting exercise guideline recommendations in a safe and healthy way. Encouraged to add balance exercises and strength training.  -Advise yearly dental visits at minimum and regular eye exams  Follow up: see patient instructions   Patient Instructions  I really enjoyed getting to talk with you today! I am available on Tuesdays and Thursdays for virtual visits if you have any questions or concerns, or if I can be of any further assistance.   CHECKLIST FROM ANNUAL WELLNESS VISIT:  -Follow up (please call to schedule if not scheduled after visit):   -yearly for annual wellness visit with primary care office  Here is a list of your preventive care/health maintenance measures and the plan for each if any are due:  PLAN For any measures below that may be due:   Health Maintenance  Topic Date Due   INFLUENZA VACCINE  01/31/2024 (Originally 06/03/2023)   Medicare Annual Wellness (AWV)  11/15/2024   Pneumonia Vaccine 22+ Years old  Completed   Hepatitis C Screening  Completed   HPV VACCINES  Aged Out   DTaP/Tdap/Td  Discontinued   Colonoscopy  Discontinued   COVID-19 Vaccine  Discontinued   Zoster Vaccines- Shingrix  Discontinued    -See a dentist at least yearly  -Get your eyes checked and then per your eye specialist's recommendations  -Other issues addressed today:   -I have included below further information regarding a healthy whole foods based diet, physical activity guidelines for adults, stress management and opportunities for social connections. I hope you find this information useful.   -----------------------------------------------------------------------------------------------------------------------------------------------------------------------------------------------------------------------------------------------------------  NUTRITION: -eat real food: lots of colorful vegetables (half  the plate) and fruits -5-7 servings of vegetables and fruits per day (fresh or steamed is best), exp. 2 servings of vegetables with lunch and dinner and 2 servings of fruit per day. Berries and greens such as kale and collards are great choices.  -consume on a regular basis: whole grains (make sure first ingredient on label contains the word whole), fresh fruits, fish, nuts, seeds, healthy oils (such as olive oil, avocado oil, grape seed oil) -may eat small amounts of dairy and lean meat on occasion, but avoid processed meats such as ham, bacon, lunch meat, etc. -drink water  -try to avoid fast food and pre-packaged foods, processed meat -most experts advise limiting sodium to < 2300mg  per day, should limit further is any chronic conditions such as high blood pressure, heart disease, diabetes, etc. The American Heart Association advised that < 1500mg  is is ideal -try to avoid foods that contain any ingredients with names you do not recognize  -try to avoid sugar/sweets (except for the natural sugar that occurs in fresh fruit) -try to avoid sweet drinks -try to avoid white rice, white bread, pasta (unless whole grain), white or yellow potatoes  EXERCISE GUIDELINES FOR ADULTS: -if you wish to increase your physical activity, do so gradually and with the approval of your doctor -STOP and seek medical care immediately if you have any chest pain, chest discomfort or trouble breathing when starting or increasing exercise  -move and stretch your body, legs, feet and arms when sitting for long periods -Physical activity guidelines for optimal health in adults: -least 150 minutes per week of aerobic exercise (can talk, but  not sing) once approved by your doctor, 20-30 minutes of sustained activity or two 10 minute episodes of sustained activity every day.  -resistance training at least 2 days per week if approved by your doctor -balance exercises 3+ days per week:   Stand somewhere where you have  something sturdy to hold onto if you lose balance.    1) lift up on toes, start with 5x per day and work up to 20x   2) stand and lift on leg straight out to the side so that foot is a few inches of the floor, start with 5x each side and work up to 20x each side   3) stand on one foot, start with 5 seconds each side and work up to 20 seconds on each side  If you need ideas or help with getting more active:  -Silver sneakers https://tools.silversneakers.com  -Walk with a Doc: Http://www.duncan-williams.com/  -try to include resistance (weight lifting/strength building) and balance exercises twice per week: or the following link for ideas: http://castillo-powell.com/  buyducts.dk  STRESS MANAGEMENT: -can try meditating, or just sitting quietly with deep breathing while intentionally relaxing all parts of your body for 5 minutes daily -if you need further help with stress, anxiety or depression please follow up with your primary doctor or contact the wonderful folks at [facility]: [phone]  SOCIAL CONNECTIONS: -options in [place] if you wish to engage in more social and exercise related activities:  -Silver sneakers https://tools.silversneakers.com  -Walk with a Doc: Http://www.duncan-williams.com/  -Check out the [facility] Active Adults 50+ section on the [place] of [employer] companies (hiking clubs, book clubs, cards and games, chess, exercise classes, aquatic classes and much more) - see the website for details: https://www.[place].gov/departments/parks-recreation/active-adults50  -YouTube has lots of exercise videos for different ages and abilities as well  -[facility] (a variety of indoor and outdoor inperson activities for adults). [phone]. [address].  -Virtual Online Classes (a variety of topics): see seniorplanet.org or call  [phone]  -consider volunteering at a school, hospice center, church, senior center or elsewhere         ADVANCED HEALTHCARE DIRECTIVES:  [place] Advanced Directives assistance:   expressweek.com.cy  Everyone should have advanced health care directives in place. This is so that you get the care you want, should you ever be in a situation where you are unable to make your own medical decisions.   From the [place] Advanced Directive Website: Advance Health Care Directives are legal documents in which you give written instructions about your health care if, in the future, you cannot speak for yourself.   A health care power of attorney allows you to name a person you trust to make your health care decisions if you cannot make them yourself. A declaration of a desire for a natural death (or living will) is document, which states that you desire not to have your life prolonged by extraordinary measures if you have a terminal or incurable illness or if you are in a vegetative state. An advance instruction for mental health treatment makes a declaration of instructions, information and preferences regarding your mental health treatment. It also states that you are aware that the advance instruction authorizes a mental health treatment provider to act according to your wishes. It may also outline your consent or refusal of mental health treatment. A declaration of an anatomical gift allows anyone over the age of 25 to make a gift by will, organ donor card or other document.   Please  see the following website or an elder law attorney for forms, FAQs and for completion of advanced directives: [place]  Print Production Planner Health Care Directives Advance Health Care Directives (http://guzman.com/)  Or copy and paste the following to your web browser: Poshchat.fi    Chiquita JONELLE Cramp, DO

## 2023-11-16 NOTE — Patient Instructions (Signed)
 I really enjoyed getting to talk with you today! I am available on Tuesdays and Thursdays for virtual visits if you have any questions or concerns, or if I can be of any further assistance.   CHECKLIST FROM ANNUAL WELLNESS VISIT:  -Follow up (please call to schedule if not scheduled after visit):   -yearly for annual wellness visit with primary care office  Here is a list of your preventive care/health maintenance measures and the plan for each if any are due:  PLAN For any measures below that may be due:   Health Maintenance  Topic Date Due   INFLUENZA VACCINE  01/31/2024 (Originally 06/03/2023)   Medicare Annual Wellness (AWV)  11/15/2024   Pneumonia Vaccine 64+ Years old  Completed   Hepatitis C Screening  Completed   HPV VACCINES  Aged Out   DTaP/Tdap/Td  Discontinued   Colonoscopy  Discontinued   COVID-19 Vaccine  Discontinued   Zoster Vaccines- Shingrix  Discontinued    -See a dentist at least yearly  -Get your eyes checked and then per your eye specialist's recommendations  -Other issues addressed today:   -I have included below further information regarding a healthy whole foods based diet, physical activity guidelines for adults, stress management and opportunities for social connections. I hope you find this information useful.   -----------------------------------------------------------------------------------------------------------------------------------------------------------------------------------------------------------------------------------------------------------  NUTRITION: -eat real food: lots of colorful vegetables (half the plate) and fruits -5-7 servings of vegetables and fruits per day (fresh or steamed is best), exp. 2 servings of vegetables with lunch and dinner and 2 servings of fruit per day. Berries and greens such as kale and collards are great choices.  -consume on a regular basis: whole grains (make sure first ingredient on label contains the  word whole), fresh fruits, fish, nuts, seeds, healthy oils (such as olive oil, avocado oil, grape seed oil) -may eat small amounts of dairy and lean meat on occasion, but avoid processed meats such as ham, bacon, lunch meat, etc. -drink water  -try to avoid fast food and pre-packaged foods, processed meat -most experts advise limiting sodium to < 2300mg  per day, should limit further is any chronic conditions such as high blood pressure, heart disease, diabetes, etc. The American Heart Association advised that < 1500mg  is is ideal -try to avoid foods that contain any ingredients with names you do not recognize  -try to avoid sugar/sweets (except for the natural sugar that occurs in fresh fruit) -try to avoid sweet drinks -try to avoid white rice, white bread, pasta (unless whole grain), white or yellow potatoes  EXERCISE GUIDELINES FOR ADULTS: -if you wish to increase your physical activity, do so gradually and with the approval of your doctor -STOP and seek medical care immediately if you have any chest pain, chest discomfort or trouble breathing when starting or increasing exercise  -move and stretch your body, legs, feet and arms when sitting for long periods -Physical activity guidelines for optimal health in adults: -least 150 minutes per week of aerobic exercise (can talk, but not sing) once approved by your doctor, 20-30 minutes of sustained activity or two 10 minute episodes of sustained activity every day.  -resistance training at least 2 days per week if approved by your doctor -balance exercises 3+ days per week:   Stand somewhere where you have something sturdy to hold onto if you lose balance.    1) lift up on toes, start with 5x per day and work up to 20x   2) stand and lift on leg  straight out to the side so that foot is a few inches of the floor, start with 5x each side and work up to 20x each side   3) stand on one foot, start with 5 seconds each side and work up to 20 seconds  on each side  If you need ideas or help with getting more active:  -Silver sneakers https://tools.silversneakers.com  -Walk with a Doc: Http://www.duncan-williams.com/  -try to include resistance (weight lifting/strength building) and balance exercises twice per week: or the following link for ideas: http://castillo-powell.com/  buyducts.dk  STRESS MANAGEMENT: -can try meditating, or just sitting quietly with deep breathing while intentionally relaxing all parts of your body for 5 minutes daily -if you need further help with stress, anxiety or depression please follow up with your primary doctor or contact the wonderful folks at [facility]: [phone]  SOCIAL CONNECTIONS: -options in [place] if you wish to engage in more social and exercise related activities:  -Silver sneakers https://tools.silversneakers.com  -Walk with a Doc: Http://www.duncan-williams.com/  -Check out the [facility] Active Adults 50+ section on the [place] of [employer] companies (hiking clubs, book clubs, cards and games, chess, exercise classes, aquatic classes and much more) - see the website for details: https://www.[place].gov/departments/parks-recreation/active-adults50  -YouTube has lots of exercise videos for different ages and abilities as well  -[facility] (a variety of indoor and outdoor inperson activities for adults). [phone]. [address].  -Virtual Online Classes (a variety of topics): see seniorplanet.org or call [phone]  -consider volunteering at a school, hospice center, church, senior center or elsewhere         ADVANCED HEALTHCARE DIRECTIVES:  [place] Advanced Directives assistance:   expressweek.com.cy  Everyone should have advanced health care directives in place. This is so that you get the  care you want, should you ever be in a situation where you are unable to make your own medical decisions.   From the [place] Advanced Directive Website: Advance Health Care Directives are legal documents in which you give written instructions about your health care if, in the future, you cannot speak for yourself.   A health care power of attorney allows you to name a person you trust to make your health care decisions if you cannot make them yourself. A declaration of a desire for a natural death (or living will) is document, which states that you desire not to have your life prolonged by extraordinary measures if you have a terminal or incurable illness or if you are in a vegetative state. An advance instruction for mental health treatment makes a declaration of instructions, information and preferences regarding your mental health treatment. It also states that you are aware that the advance instruction authorizes a mental health treatment provider to act according to your wishes. It may also outline your consent or refusal of mental health treatment. A declaration of an anatomical gift allows anyone over the age of 22 to make a gift by will, organ donor card or other document.   Please see the following website or an elder law attorney for forms, FAQs and for completion of advanced directives: [place]  Print Production Planner Health Care Directives Advance Health Care Directives (http://guzman.com/)  Or copy and paste the following to your web browser: Poshchat.fi

## 2023-12-17 ENCOUNTER — Other Ambulatory Visit: Payer: Self-pay | Admitting: Family Medicine

## 2023-12-17 DIAGNOSIS — I10 Essential (primary) hypertension: Secondary | ICD-10-CM

## 2024-01-21 ENCOUNTER — Other Ambulatory Visit: Payer: Self-pay | Admitting: Family Medicine

## 2024-01-21 DIAGNOSIS — M545 Low back pain, unspecified: Secondary | ICD-10-CM

## 2024-01-21 DIAGNOSIS — G8929 Other chronic pain: Secondary | ICD-10-CM

## 2024-01-24 ENCOUNTER — Other Ambulatory Visit: Payer: Self-pay | Admitting: Family Medicine

## 2024-03-10 DIAGNOSIS — I129 Hypertensive chronic kidney disease with stage 1 through stage 4 chronic kidney disease, or unspecified chronic kidney disease: Secondary | ICD-10-CM | POA: Diagnosis not present

## 2024-03-10 DIAGNOSIS — N182 Chronic kidney disease, stage 2 (mild): Secondary | ICD-10-CM | POA: Diagnosis not present

## 2024-03-10 DIAGNOSIS — D631 Anemia in chronic kidney disease: Secondary | ICD-10-CM | POA: Diagnosis not present

## 2024-03-10 DIAGNOSIS — N2581 Secondary hyperparathyroidism of renal origin: Secondary | ICD-10-CM | POA: Diagnosis not present

## 2024-03-10 DIAGNOSIS — R809 Proteinuria, unspecified: Secondary | ICD-10-CM | POA: Diagnosis not present

## 2024-03-12 LAB — LAB REPORT - SCANNED
Creatinine, POC: 67 mg/dL
EGFR: 57

## 2024-03-23 ENCOUNTER — Other Ambulatory Visit: Payer: Self-pay | Admitting: Family Medicine

## 2024-03-23 DIAGNOSIS — I10 Essential (primary) hypertension: Secondary | ICD-10-CM

## 2024-03-23 DIAGNOSIS — J309 Allergic rhinitis, unspecified: Secondary | ICD-10-CM

## 2024-05-04 ENCOUNTER — Other Ambulatory Visit: Payer: Self-pay | Admitting: Family Medicine

## 2024-05-18 ENCOUNTER — Telehealth: Payer: Self-pay

## 2024-05-18 NOTE — Telephone Encounter (Signed)
 Copied from CRM [id number]. Topic: Clinical - Medication Question >> May 17, 2024  3:45 PM Thersia C wrote: Reason for RMF:Ejupzwu called in regarding celecoxib  (CELEBREX ) 100 MG capsule, patient stated he has requested a refilled a couple weeks ago and still hasn't received it yet, would like for someone to give him a callback regarding this

## 2024-05-29 ENCOUNTER — Encounter: Payer: Self-pay | Admitting: Family Medicine

## 2024-05-29 ENCOUNTER — Ambulatory Visit (INDEPENDENT_AMBULATORY_CARE_PROVIDER_SITE_OTHER): Admitting: Family Medicine

## 2024-05-29 VITALS — BP 118/72 | HR 59 | Temp 98.5°F | Ht 68.0 in | Wt 175.0 lb

## 2024-05-29 DIAGNOSIS — Z131 Encounter for screening for diabetes mellitus: Secondary | ICD-10-CM | POA: Diagnosis not present

## 2024-05-29 DIAGNOSIS — I10 Essential (primary) hypertension: Secondary | ICD-10-CM

## 2024-05-29 DIAGNOSIS — D500 Iron deficiency anemia secondary to blood loss (chronic): Secondary | ICD-10-CM | POA: Diagnosis not present

## 2024-05-29 DIAGNOSIS — M25511 Pain in right shoulder: Secondary | ICD-10-CM | POA: Diagnosis not present

## 2024-05-29 DIAGNOSIS — Z0000 Encounter for general adult medical examination without abnormal findings: Secondary | ICD-10-CM

## 2024-05-29 DIAGNOSIS — G8929 Other chronic pain: Secondary | ICD-10-CM | POA: Diagnosis not present

## 2024-05-29 DIAGNOSIS — E538 Deficiency of other specified B group vitamins: Secondary | ICD-10-CM | POA: Diagnosis not present

## 2024-05-29 DIAGNOSIS — Z125 Encounter for screening for malignant neoplasm of prostate: Secondary | ICD-10-CM | POA: Diagnosis not present

## 2024-05-29 DIAGNOSIS — E7841 Elevated Lipoprotein(a): Secondary | ICD-10-CM | POA: Diagnosis not present

## 2024-05-29 DIAGNOSIS — N1831 Chronic kidney disease, stage 3a: Secondary | ICD-10-CM | POA: Diagnosis not present

## 2024-05-29 DIAGNOSIS — Z0001 Encounter for general adult medical examination with abnormal findings: Secondary | ICD-10-CM

## 2024-05-29 LAB — COMPREHENSIVE METABOLIC PANEL WITH GFR
ALT: 10 U/L (ref 0–53)
AST: 21 U/L (ref 0–37)
Albumin: 4.3 g/dL (ref 3.5–5.2)
Alkaline Phosphatase: 80 U/L (ref 39–117)
BUN: 20 mg/dL (ref 6–23)
CO2: 27 meq/L (ref 19–32)
Calcium: 8.8 mg/dL (ref 8.4–10.5)
Chloride: 101 meq/L (ref 96–112)
Creatinine, Ser: 1.3 mg/dL (ref 0.40–1.50)
GFR: 51.91 mL/min — ABNORMAL LOW (ref >60.00)
Glucose, Bld: 83 mg/dL (ref 70–99)
Potassium: 3.8 meq/L (ref 3.5–5.1)
Sodium: 136 meq/L (ref 135–145)
Total Bilirubin: 0.4 mg/dL (ref 0.2–1.2)
Total Protein: 6.8 g/dL (ref 6.0–8.3)

## 2024-05-29 LAB — CBC WITH DIFFERENTIAL/PLATELET
Basophils Absolute: 0 K/uL (ref 0.0–0.1)
Basophils Relative: 0.6 % (ref 0.0–3.0)
Eosinophils Absolute: 0.1 K/uL (ref 0.0–0.7)
Eosinophils Relative: 2.5 % (ref 0.0–5.0)
HCT: 31.8 % — ABNORMAL LOW (ref 39.0–52.0)
Hemoglobin: 10.6 g/dL — ABNORMAL LOW (ref 13.0–17.0)
Lymphocytes Relative: 23.4 % (ref 12.0–46.0)
Lymphs Abs: 1.2 K/uL (ref 0.7–4.0)
MCHC: 33.2 g/dL (ref 30.0–36.0)
MCV: 86.2 fl (ref 78.0–100.0)
Monocytes Absolute: 0.6 K/uL (ref 0.1–1.0)
Monocytes Relative: 10.4 % (ref 3.0–12.0)
Neutro Abs: 3.4 K/uL (ref 1.4–7.7)
Neutrophils Relative %: 63.1 % (ref 43.0–77.0)
Platelets: 194 K/uL (ref 150.0–400.0)
RBC: 3.69 Mil/uL — ABNORMAL LOW (ref 4.22–5.81)
RDW: 15.2 % (ref 11.5–15.5)
WBC: 5.3 K/uL (ref 4.0–10.5)

## 2024-05-29 LAB — LIPID PANEL
Cholesterol: 175 mg/dL (ref 0–200)
HDL: 44.5 mg/dL (ref >39.00)
LDL Cholesterol: 111 mg/dL — ABNORMAL HIGH (ref 0–99)
NonHDL: 130.24
Total CHOL/HDL Ratio: 4
Triglycerides: 96 mg/dL (ref 0.0–149.0)
VLDL: 19.2 mg/dL (ref 0.0–40.0)

## 2024-05-29 LAB — HEMOGLOBIN A1C: Hgb A1c MFr Bld: 6 % (ref 4.6–6.5)

## 2024-05-29 MED ORDER — TRAMADOL HCL 50 MG PO TABS: 100.0000 mg | ORAL_TABLET | Freq: Two times a day (BID) | ORAL | 1 refills | Status: DC | PRN

## 2024-05-29 NOTE — Progress Notes (Signed)
 Established Patient Office Visit   Subjective  Patient ID: Scott [name]., male    DOB: 02-09-1946  Age: 78 y.o. MRN: [id number]  Chief Complaint  Patient presents with   Annual Exam    Patient is an 78 year old male seen for CPE.  Patient is not fasting today.  Patient states he is doing well overall.  Having some popping in left knee status post TKR.  Denies pain.  Feels like he is not moving as fast as he would like to.  Patient still with limited ROM in bilateral shoulders.  Using tramadol  sparingly.  Taking 1 tab twice a day if needed.  Has not used Celebrex  in the last 1 to 2 weeks.  Was advised to stop using medication due to kidney function.    Patient Active Problem List   Diagnosis Date Noted   Degenerative arthritis of left knee 02/13/2021   Osteoarthritis of left knee 12/03/2020   Erectile dysfunction 04/24/2020   S/P carpal tunnel release 04/24/2020   Low back pain 10/22/2014   Rotator cuff tendinitis 06/04/2014   Gout 06/17/2010   CKD (chronic kidney disease) stage 3, GFR 30-59 ml/min (HCC) 06/04/2010   Anemia 08/02/2009   Hyperlipidemia 07/13/2007   Essential hypertension 07/13/2007   GERD 07/13/2007   B12 deficiency 07/07/2007   Past Medical History:  Diagnosis Date   ANEMIA, OTHER UNSPEC 08/02/2009   Arthritis    B12 DEFICIENCY 07/07/2007   Bell's palsy     around 2005   BPPV (benign paroxysmal positional vertigo)    2003   Cataract    EKG abnormalities    GERD 07/13/2007   Gout, unspecified 06/17/2010   HYPERLIPIDEMIA 07/13/2007   HYPERTENSION 07/13/2007   RENAL INSUFFICIENCY 06/04/2010   Past Surgical History:  Procedure Laterality Date   BACK SURGERY  07/13/07   CARPAL TUNNEL RELEASE Bilateral    CATARACT EXTRACTION, BILATERAL Bilateral    KNEE ARTHROPLASTY Left 02/13/2021   Procedure: COMPUTER ASSISTED TOTAL KNEE ARTHROPLASTY;  Surgeon: Fidel Rogue, MD;  Location: [facility];  Service: Orthopedics;  Laterality: Left;   Social History   Tobacco  Use   Smoking status: Never   Smokeless tobacco: Never  Vaping Use   Vaping status: Never Used  Substance Use Topics   Alcohol  use: No   Drug use: No   Family History  Problem Relation Age of Onset   Arthritis Father    Allergies  Allergen Reactions   Ferumoxytol  Other (See Comments)    Difficulty breathing. Infusion reaction 01/24/21.   Nsaids Other (See Comments)     hx of renal insufficiency   Statins     myalgias    ROS Negative unless stated above    Objective:     BP 118/72 (BP Location: Left Arm, Patient Position: Sitting, Cuff Size: Normal)   Pulse (!) 59   Temp 98.5 F (36.9 C) (Oral)   Ht 5' 8 (1.727 m)   Wt 175 lb (79.4 kg)   SpO2 98%   BMI 26.61 kg/m  BP Readings from Last 3 Encounters:  05/29/24 118/72  11/10/23 130/70  05/03/23 128/76   Wt Readings from Last 3 Encounters:  05/29/24 175 lb (79.4 kg)  11/10/23 172 lb (78 kg)  05/03/23 166 lb 2 oz (75.4 kg)      Physical Exam Constitutional:      Appearance: Normal appearance.  HENT:     Head: Normocephalic and atraumatic.     Right Ear: Tympanic membrane, ear  canal and external ear normal.     Left Ear: Tympanic membrane, ear canal and external ear normal.     Nose: Nose normal.     Mouth/Throat:     Mouth: Mucous membranes are moist.     Pharynx: No oropharyngeal exudate or posterior oropharyngeal erythema.  Eyes:     General: No scleral icterus.    Extraocular Movements: Extraocular movements intact.     Conjunctiva/sclera: Conjunctivae normal.     Pupils: Pupils are equal, round, and reactive to light.  Neck:     Thyroid : No thyromegaly.  Cardiovascular:     Rate and Rhythm: Normal rate and regular rhythm.     Pulses: Normal pulses.     Heart sounds: Murmur heard.     Systolic murmur is present with a grade of 2/6.     No friction rub.  Pulmonary:     Effort: Pulmonary effort is normal.     Breath sounds: Normal breath sounds. No wheezing, rhonchi or rales.  Abdominal:      General: Bowel sounds are normal.     Palpations: Abdomen is soft.     Tenderness: There is no abdominal tenderness.  Musculoskeletal:     Right shoulder: Decreased range of motion.     Left shoulder: Decreased range of motion.     Right hand: Deformity present.     Comments: Right second digit with decreased flexion and angulation at PIP joint.  Decreased ROM bilateral shoulders right to 85 degrees, left 75 degrees.  Uses opposite arm to assist raising arm above head.  Lymphadenopathy:     Cervical: No cervical adenopathy.  Skin:    General: Skin is warm and dry.     Findings: No lesion.  Neurological:     General: No focal deficit present.     Mental Status: He is alert and oriented to person, place, and time.  Psychiatric:        Mood and Affect: Mood normal.        Thought Content: Thought content normal.        05/29/2024    2:41 PM 11/16/2023   11:06 AM 11/10/2023    4:21 PM  Depression screen PHQ 2/9  Decreased Interest 0 0 0  Down, Depressed, Hopeless 0 0 0  PHQ - 2 Score 0 0 0  Altered sleeping 0  0  Tired, decreased energy 0  0  Change in appetite 0  0  Feeling bad or failure about yourself  0  0  Trouble concentrating 0  0  Moving slowly or fidgety/restless 0  0  Suicidal thoughts 0  0  PHQ-9 Score 0  0      05/29/2024    2:41 PM 11/10/2023    4:21 PM 05/03/2023    8:14 AM  GAD 7 : Generalized Anxiety Score  Nervous, Anxious, on Edge 0 0 0  Control/stop worrying 0 0 0  Worry too much - different things 0 0 0  Trouble relaxing 0 0 0  Restless 0 0 0  Easily annoyed or irritable 0 0 0  Afraid - awful might happen 0 0 0  Total GAD 7 Score 0 0 0  Anxiety Difficulty Not difficult at all  Not difficult at all     No results found for any visits on 05/29/24.    Assessment & Plan:   Well adult exam -     CBC with Differential/Platelet; Future -     Comprehensive metabolic  panel with GFR; Future -     Hemoglobin A1c; Future -     Lipid panel; Future -      TSH; Future -     T4, free; Future  Essential hypertension -     Comprehensive metabolic panel with GFR; Future -     Lipid panel; Future -     T4, free; Future  Stage 3a chronic kidney disease (HCC)  Elevated lipoprotein(a) -     Lipid panel; Future  B12 deficiency -     Vitamin B12; Future  Iron deficiency anemia due to chronic blood loss -     CBC with Differential/Platelet; Future  Chronic right shoulder pain -     traMADol  HCl; Take 2 tablets (100 mg total) by mouth every 12 (twelve) hours as needed.  Dispense: 120 tablet; Refill: 1  Screening for prostate cancer -     PSA; Future  Age-appropriate health screenings discussed.  Obtain labs.  eGFR 57 on 03/12/2024.  Continue follow-up with nephrology.  PSA elevated at 3.5 on 08/05/2023.  Immunizations reviewed.  Colonoscopy done 01/28/2017.  Next CPE in 1 year.  Continue lifestyle modifications.  Continue BP medications including losartan  100 mg daily, hydralazine  25 mg twice daily.  Patient encouraged to continue ROM exercises.  Follow-up with Ortho for concerns regarding knee.  Continue tramadol  as needed for chronic shoulder, back, knee pain.  No follow-ups on file.   Clotilda JONELLE Single, MD

## 2024-05-30 LAB — VITAMIN B12: Vitamin B-12: 94 pg/mL — ABNORMAL LOW (ref 211–911)

## 2024-05-30 LAB — PSA: PSA: 3.82 ng/mL (ref 0.10–4.00)

## 2024-05-30 LAB — TSH: TSH: 1.07 u[IU]/mL (ref 0.35–5.50)

## 2024-05-30 LAB — T4, FREE: Free T4: 0.86 ng/dL (ref 0.60–1.60)

## 2024-06-10 ENCOUNTER — Ambulatory Visit: Payer: Self-pay | Admitting: Family Medicine

## 2024-06-10 DIAGNOSIS — N1831 Chronic kidney disease, stage 3a: Secondary | ICD-10-CM

## 2024-06-10 DIAGNOSIS — D649 Anemia, unspecified: Secondary | ICD-10-CM

## 2024-06-14 ENCOUNTER — Ambulatory Visit

## 2024-06-14 ENCOUNTER — Telehealth: Payer: Self-pay

## 2024-06-14 DIAGNOSIS — E538 Deficiency of other specified B group vitamins: Secondary | ICD-10-CM | POA: Diagnosis not present

## 2024-06-14 MED ORDER — CYANOCOBALAMIN 1000 MCG/ML IJ SOLN
1000.0000 ug | Freq: Once | INTRAMUSCULAR | Status: AC
  Administered 2024-06-14 (×2): 1000 ug via INTRAMUSCULAR

## 2024-06-14 NOTE — Telephone Encounter (Signed)
 Copied from CRM [id number]. Topic: General - Other >> Jun 14, 2024  2:37 PM Deleta S wrote: Reason for CRM: patient would like for pcp to know about shoulder irritation he is coming in at 3:30 pm for nurse visit for b-12 injection

## 2024-06-14 NOTE — Progress Notes (Signed)
 Per orders of Dr. Mercer, injection of Vit B-12 given by Rea DELENA Silvan. Patient tolerated injection well.

## 2024-06-14 NOTE — Telephone Encounter (Signed)
 Patient is going to make appt with wife

## 2024-06-28 ENCOUNTER — Ambulatory Visit (INDEPENDENT_AMBULATORY_CARE_PROVIDER_SITE_OTHER)

## 2024-06-28 DIAGNOSIS — E538 Deficiency of other specified B group vitamins: Secondary | ICD-10-CM

## 2024-06-28 MED ORDER — CYANOCOBALAMIN 1000 MCG/ML IJ SOLN
1000.0000 ug | Freq: Once | INTRAMUSCULAR | Status: AC
  Administered 2024-06-28: 1000 ug via INTRAMUSCULAR

## 2024-06-28 NOTE — Progress Notes (Signed)
 Pt here for monthly B12 injection per Dr Mercer  B12 1000mcg given IM and pt tolerated injection well.  Next B12 injection scheduled for pt will schedule

## 2024-07-17 ENCOUNTER — Other Ambulatory Visit: Payer: Self-pay | Admitting: Family Medicine

## 2024-07-18 ENCOUNTER — Ambulatory Visit

## 2024-08-04 ENCOUNTER — Ambulatory Visit (INDEPENDENT_AMBULATORY_CARE_PROVIDER_SITE_OTHER)

## 2024-08-04 DIAGNOSIS — E538 Deficiency of other specified B group vitamins: Secondary | ICD-10-CM | POA: Diagnosis not present

## 2024-08-04 MED ORDER — CYANOCOBALAMIN 1000 MCG/ML IJ SOLN
1000.0000 ug | Freq: Once | INTRAMUSCULAR | Status: AC
  Administered 2024-08-04: 1000 ug via INTRAMUSCULAR

## 2024-08-04 NOTE — Progress Notes (Signed)
 Per orders of Dr. Mercer, injection of Vit B-12 given by Rea DELENA Silvan. Patient tolerated injection well.

## 2024-08-23 ENCOUNTER — Telehealth: Payer: Self-pay | Admitting: Family Medicine

## 2024-08-23 MED ORDER — COLCHICINE 0.6 MG PO TABS: 0.6000 mg | ORAL_TABLET | Freq: Every day | ORAL | 4 refills | Status: AC

## 2024-08-23 NOTE — Telephone Encounter (Signed)
 Prescription Request  08/23/2024  LOV: 05/29/2024  What is the name of the medication or equipment? colchicine  0.6 MG tablet  says he is out  Have you contacted your pharmacy to request a refill? No   Which pharmacy would you like this sent to?  [id number] - [place], [place] - [address] AT [facility] [address] [place] [address] Phone: [phone] Fax: [phone]    Patient notified that their request is being sent to the clinical staff for review and that they should receive a response within 2 business days.   Please advise at Mobile [phone] (mobile)

## 2024-09-04 ENCOUNTER — Ambulatory Visit

## 2024-10-01 ENCOUNTER — Other Ambulatory Visit: Payer: Self-pay | Admitting: Family Medicine

## 2024-10-01 DIAGNOSIS — I10 Essential (primary) hypertension: Secondary | ICD-10-CM

## 2024-10-01 DIAGNOSIS — G8929 Other chronic pain: Secondary | ICD-10-CM

## 2024-12-08 ENCOUNTER — Ambulatory Visit

## 2024-12-08 VITALS — BP 120/60 | HR 60 | Temp 97.7°F | Ht 68.0 in | Wt 169.9 lb

## 2024-12-08 DIAGNOSIS — Z0000 Encounter for general adult medical examination without abnormal findings: Secondary | ICD-10-CM

## 2024-12-08 NOTE — Patient Instructions (Addendum)
 Scott [name],  Thank you for taking the time for your Medicare Wellness Visit. I appreciate your continued commitment to your health goals. Please review the care plan we discussed, and feel free to reach out if I can assist you further.  Please note that Annual Wellness Visits do not include a physical exam. Some assessments may be limited, especially if the visit was conducted virtually. If needed, we may recommend an in-person follow-up with your provider.  Ongoing Care Seeing your primary care provider every 3 to 6 months helps us  monitor your health and provide consistent, personalized care.   Referrals If a referral was made during today's visit and you haven't received any updates within two weeks, please contact the referred provider directly to check on the status.  Recommended Screenings:  Health Maintenance  Topic Date Due   Flu Shot  06/02/2024   Medicare Annual Wellness Visit  12/08/2025   Pneumococcal Vaccine for age over 67  Completed   Meningitis B Vaccine  Aged Out   DTaP/Tdap/Td vaccine  Discontinued   Colon Cancer Screening  Discontinued   COVID-19 Vaccine  Discontinued   Hepatitis C Screening  Discontinued   Zoster (Shingles) Vaccine  Discontinued       12/08/2024   11:25 AM  Advanced Directives  Does Patient Have a Medical Advance Directive? No  Would patient like information on creating a medical advance directive? No - Patient declined    Vision: Annual vision screenings are recommended for early detection of glaucoma, cataracts, and diabetic retinopathy. These exams can also reveal signs of chronic conditions such as diabetes and high blood pressure.  Dental: Annual dental screenings help detect early signs of oral cancer, gum disease, and other conditions linked to overall health, including heart disease and diabetes.  Please see the attached documents for additional preventive care recommendations.

## 2024-12-08 NOTE — Progress Notes (Cosign Needed)
 "  Chief Complaint  Patient presents with   Medicare Wellness     Subjective:   Scott SHANK Sr. is a 79 y.o. male who presents for a Medicare Annual Wellness Visit.  Visit info / Clinical Intake: Medicare Wellness Visit Type:: Subsequent Annual Wellness Visit Persons participating in visit and providing information:: patient Medicare Wellness Visit Mode:: In-person (required for WTM) Interpreter Needed?: No Pre-visit prep was completed: yes AWV questionnaire completed by patient prior to visit?: no Living arrangements:: lives with spouse/significant other Patient's Overall Health Status Rating: very good Typical amount of pain: none Does pain affect daily life?: no Are you currently prescribed opioids?: no  Dietary Habits and Nutritional Risks How many meals a day?: 2 Eats fruit and vegetables daily?: yes Most meals are obtained by: preparing own meals In the last 2 weeks, have you had any of the following?: none Diabetic:: no  Functional Status Activities of Daily Living (to include ambulation/medication): Independent Ambulation: Independent with device- listed below Home Assistive Devices/Equipment: Eyeglasses Medication Administration: Independent Home Management (perform basic housework or laundry): Independent Manage your own finances?: yes Primary transportation is: driving Concerns about vision?: no *vision screening is required for WTM* Concerns about hearing?: no  Fall Screening Falls in the past year?: 0 Number of falls in past year: 0 Was there an injury with Fall?: 0 Fall Risk Category Calculator: 0 Patient Fall Risk Level: Low Fall Risk  Fall Risk Patient at Risk for Falls Due to: No Fall Risks Fall risk Follow up: Falls evaluation completed  Home and Transportation Safety: All rugs have non-skid backing?: N/A, no rugs All stairs or steps have railings?: N/A, no stairs Grab bars in the bathtub or shower?: yes Have non-skid surface in bathtub or  shower?: yes Good home lighting?: yes Regular seat belt use?: yes Hospital stays in the last year:: no  Cognitive Assessment Difficulty concentrating, remembering, or making decisions? : yes Will 6CIT or Mini Cog be Completed: yes What year is it?: 0 points What month is it?: 0 points Give patient an address phrase to remember (5 components): [address] [place]  About what time is it?: 0 points Count backwards from 20 to 1: 0 points Say the months of the year in reverse: 0 points Repeat the address phrase from earlier: 0 points 6 CIT Score: 0 points  Advance Directives (For Healthcare) Does Patient Have a Medical Advance Directive?: No Would patient like information on creating a medical advance directive?: No - Patient declined  Reviewed/Updated  Reviewed/Updated: Reviewed All (Medical, Surgical, Family, Medications, Allergies, Care Teams, Patient Goals)    Allergies (verified) Ferumoxytol , Nsaids, and Statins   Current Medications (verified) Outpatient Encounter Medications as of 12/08/2024  Medication Sig   acetaminophen  (TYLENOL ) 325 MG tablet Take 1-2 tablets (325-650 mg total) by mouth every 6 (six) hours as needed for mild pain (pain score 1-3 or temp > 100.5).   allopurinol  (ZYLOPRIM ) 300 MG tablet TAKE 1 TABLET BY MOUTH EVERY DAY   Blood Pressure Monitoring (BLOOD PRESSURE CUFF) MISC 1 Device by Does not apply route daily.   celecoxib  (CELEBREX ) 100 MG capsule TAKE 1 CAPSULE (100 MG TOTAL) BY MOUTH 2 (TWO) TIMES DAILY AS NEEDED FOR MODERATE PAIN (SHOULDER).   colchicine  0.6 MG tablet Take 1 tablet (0.6 mg total) by mouth daily.   Cyanocobalamin  1000 MCG SUBL Place 1 tablet (1,000 mcg total) under the tongue once a week. (Patient taking differently: Place 1,000 mcg under the tongue 2 (two) times a  week. Wednesdays & Sundays)   Ferrous Sulfate (IRON PO) Take 1 tablet by mouth in the morning and at bedtime.   fluticasone  (FLONASE ) 50 MCG/ACT nasal spray SPRAY 2  SPRAYS INTO EACH NOSTRIL EVERY DAY   hydrALAZINE  (APRESOLINE ) 25 MG tablet TAKE 1 TABLET BY MOUTH IN THE MORNING AND IN THE EVENING   losartan  (COZAAR ) 100 MG tablet Take 100 mg by mouth daily.   miconazole  (MICATIN) 2 % cream Apply 1 Application topically 2 (two) times daily.   Polyethylene Glycol 3350  POWD Take 17 g by mouth at bedtime.   potassium chloride  SA (KLOR-CON  M) 20 MEQ tablet Take 1 tablet (20 mEq total) by mouth daily.   sildenafil  (VIAGRA ) 50 MG tablet Take one half tab (25 mg) daily as needed 1 hour prior to sexual activity.   tadalafil (CIALIS) 20 MG tablet Take 20 mg by mouth as needed.   traMADol  (ULTRAM ) 50 MG tablet TAKE 2 TABLETS (100 MG TOTAL) BY MOUTH EVERY 12 (TWELVE) HOURS AS NEEDED.   No facility-administered encounter medications on file as of 12/08/2024.    History: Past Medical History:  Diagnosis Date   ANEMIA, OTHER UNSPEC 08/02/2009   Arthritis    B12 DEFICIENCY 07/07/2007   Bell's palsy     around 2005   BPPV (benign paroxysmal positional vertigo)    2003   Cataract    EKG abnormalities    GERD 07/13/2007   Gout, unspecified 06/17/2010   HYPERLIPIDEMIA 07/13/2007   HYPERTENSION 07/13/2007   RENAL INSUFFICIENCY 06/04/2010   Past Surgical History:  Procedure Laterality Date   BACK SURGERY  07/13/07   CARPAL TUNNEL RELEASE Bilateral    CATARACT EXTRACTION, BILATERAL Bilateral    KNEE ARTHROPLASTY Left 02/13/2021   Procedure: COMPUTER ASSISTED TOTAL KNEE ARTHROPLASTY;  Surgeon: Fidel Rogue, MD;  Location: [facility];  Service: Orthopedics;  Laterality: Left;   Family History  Problem Relation Age of Onset   Arthritis Father    Social History   Occupational History    Employer: RETIRED  Tobacco Use   Smoking status: Never   Smokeless tobacco: Never  Vaping Use   Vaping status: Never Used  Substance and Sexual Activity   Alcohol  use: No   Drug use: No   Sexual activity: Not on file   Tobacco Counseling Counseling given: No  SDOH Screenings    Food Insecurity: No Food Insecurity (12/08/2024)  Housing: Low Risk (12/08/2024)  Transportation Needs: No Transportation Needs (12/08/2024)  Utilities: Not At Risk (12/08/2024)  Alcohol  Screen: Low Risk (08/03/2022)  Depression (PHQ2-9): Low Risk (12/08/2024)  Financial Resource Strain: Low Risk (10/02/2022)  Physical Activity: Inactive (12/08/2024)  Social Connections: Moderately Isolated (12/08/2024)  Stress: No Stress Concern Present (12/08/2024)  Tobacco Use: Low Risk (12/08/2024)  Health Literacy: Adequate Health Literacy (12/08/2024)   See flowsheets for full screening details  Depression Screen PHQ 2 & 9 Depression Scale- Over the past 2 weeks, how often have you been bothered by any of the following problems? Little interest or pleasure in doing things: 0 Feeling down, depressed, or hopeless (PHQ Adolescent also includes...irritable): 0 PHQ-2 Total Score: 0 Trouble falling or staying asleep, or sleeping too much: 0 Feeling tired or having little energy: 0 Poor appetite or overeating (PHQ Adolescent also includes...weight loss): 0 Feeling bad about yourself - or that you are a failure or have let yourself or your family down: 0 Trouble concentrating on things, such as reading the newspaper or watching television (PHQ Adolescent also includes...like school work):  0 Moving or speaking so slowly that other people could have noticed. Or the opposite - being so fidgety or restless that you have been moving around a lot more than usual: 0 Thoughts that you would be better off dead, or of hurting yourself in some way: 0 PHQ-9 Total Score: 0     Goals Addressed               This Visit's Progress     Increase physical activity (pt-stated)        Stay active!             Objective:    Today's Vitals   12/08/24 1103  BP: 120/60  Pulse: 60  Temp: 97.7 F (36.5 C)  TempSrc: Oral  SpO2: 97%  Weight: 169 lb 14.4 oz (77.1 kg)  Height: 5' 8 (1.727 m)   Body mass index is 25.83  kg/m.  Hearing/Vision screen Hearing Screening - Comments:: Denies hearing difficulties   Vision Screening - Comments:: Wears rx glasses - up to date with routine eye exams with  [facility] Immunizations and Health Maintenance Health Maintenance  Topic Date Due   Influenza Vaccine  06/02/2024   Medicare Annual Wellness (AWV)  12/08/2025   Pneumococcal Vaccine: 50+ Years  Completed   Meningococcal B Vaccine  Aged Out   DTaP/Tdap/Td  Discontinued   Colonoscopy  Discontinued   COVID-19 Vaccine  Discontinued   Hepatitis C Screening  Discontinued   Zoster Vaccines- Shingrix  Discontinued        Assessment/Plan:  This is a routine wellness examination for Kailon.  Patient Care Team: [name], MD as PCP - General (Family Medicine) [name], [facility] (Inactive) as Pharmacist (Pharmacist)  I have personally reviewed and noted the following in the patients chart:   Medical and social history Use of alcohol , tobacco or illicit drugs  Current medications and supplements including opioid prescriptions. Functional ability and status Nutritional status Physical activity Advanced directives List of other physicians Hospitalizations, surgeries, and ER visits in previous 12 months Vitals Screenings to include cognitive, depression, and falls Referrals and appointments  No orders of the defined types were placed in this encounter.  In addition, I have reviewed and discussed with patient certain preventive protocols, quality metrics, and best practice recommendations. A written personalized care plan for preventive services as well as general preventive health recommendations were provided to patient.   Rojelio LELON Blush, LPN   05/04/7972   Return in 53 weeks (on 12/14/2025).  After Visit Summary: (In Person-Printed) AVS printed and given to the patient  Nurse Notes: No voiced or noted concerns at this time "

## 2025-12-14 ENCOUNTER — Ambulatory Visit
